# Patient Record
Sex: Female | Born: 1960 | Race: White | Hispanic: No | Marital: Single | Smoking: Current every day smoker
Health system: Southern US, Community
[De-identification: ages and names within clinical notes are randomized; demographics above are authoritative.]

## PROBLEM LIST (undated history)

## (undated) DIAGNOSIS — Z86010 Personal history of colonic polyps: Secondary | ICD-10-CM

## (undated) DIAGNOSIS — C4401 Basal cell carcinoma of skin of lip: Secondary | ICD-10-CM

## (undated) DIAGNOSIS — E785 Hyperlipidemia, unspecified: Secondary | ICD-10-CM

## (undated) DIAGNOSIS — Z8659 Personal history of other mental and behavioral disorders: Secondary | ICD-10-CM

## (undated) DIAGNOSIS — F209 Schizophrenia, unspecified: Secondary | ICD-10-CM

## (undated) DIAGNOSIS — J302 Other seasonal allergic rhinitis: Secondary | ICD-10-CM

## (undated) DIAGNOSIS — J439 Emphysema, unspecified: Secondary | ICD-10-CM

## (undated) DIAGNOSIS — Z9889 Other specified postprocedural states: Secondary | ICD-10-CM

## (undated) DIAGNOSIS — L309 Dermatitis, unspecified: Secondary | ICD-10-CM

## (undated) DIAGNOSIS — Z860101 Personal history of adenomatous and serrated colon polyps: Secondary | ICD-10-CM

## (undated) DIAGNOSIS — R7303 Prediabetes: Secondary | ICD-10-CM

## (undated) DIAGNOSIS — R000 Tachycardia, unspecified: Secondary | ICD-10-CM

## (undated) DIAGNOSIS — K621 Rectal polyp: Secondary | ICD-10-CM

## (undated) DIAGNOSIS — F411 Generalized anxiety disorder: Secondary | ICD-10-CM

## (undated) DIAGNOSIS — J341 Cyst and mucocele of nose and nasal sinus: Principal | ICD-10-CM

## (undated) DIAGNOSIS — Z85828 Personal history of other malignant neoplasm of skin: Secondary | ICD-10-CM

## (undated) DIAGNOSIS — K5909 Other constipation: Secondary | ICD-10-CM

## (undated) DIAGNOSIS — K219 Gastro-esophageal reflux disease without esophagitis: Secondary | ICD-10-CM

## (undated) DIAGNOSIS — F410 Panic disorder [episodic paroxysmal anxiety] without agoraphobia: Secondary | ICD-10-CM

## (undated) DIAGNOSIS — T7840XA Allergy, unspecified, initial encounter: Secondary | ICD-10-CM

## (undated) DIAGNOSIS — L409 Psoriasis, unspecified: Secondary | ICD-10-CM

## (undated) DIAGNOSIS — J45909 Unspecified asthma, uncomplicated: Secondary | ICD-10-CM

## (undated) DIAGNOSIS — F17200 Nicotine dependence, unspecified, uncomplicated: Secondary | ICD-10-CM

## (undated) DIAGNOSIS — F419 Anxiety disorder, unspecified: Secondary | ICD-10-CM

## (undated) HISTORY — DX: Panic disorder (episodic paroxysmal anxiety): F41.0

## (undated) HISTORY — DX: Cyst and mucocele of nose and nasal sinus: J34.1

## (undated) HISTORY — DX: Tachycardia, unspecified: R00.0

## (undated) HISTORY — DX: Hyperlipidemia, unspecified: E78.5

## (undated) HISTORY — DX: Gastro-esophageal reflux disease without esophagitis: K21.9

## (undated) HISTORY — DX: Emphysema, unspecified: J43.9

## (undated) HISTORY — DX: Basal cell carcinoma of skin of lip: C44.01

## (undated) HISTORY — PX: COLONOSCOPY: SHX174

## (undated) HISTORY — DX: Anxiety disorder, unspecified: F41.9

## (undated) HISTORY — PX: OTHER SURGICAL HISTORY: SHX169

## (undated) HISTORY — DX: Nicotine dependence, unspecified, uncomplicated: F17.200

## (undated) HISTORY — DX: Allergy, unspecified, initial encounter: T78.40XA

---

## 1997-11-09 ENCOUNTER — Encounter: Admission: RE | Admit: 1997-11-09 | Discharge: 1997-11-09 | Payer: Self-pay | Admitting: Family Medicine

## 1998-02-06 ENCOUNTER — Encounter: Admission: RE | Admit: 1998-02-06 | Discharge: 1998-02-06 | Payer: Self-pay | Admitting: Family Medicine

## 1998-04-06 ENCOUNTER — Encounter: Admission: RE | Admit: 1998-04-06 | Discharge: 1998-04-06 | Payer: Self-pay | Admitting: Family Medicine

## 1998-04-09 ENCOUNTER — Encounter: Admission: RE | Admit: 1998-04-09 | Discharge: 1998-04-09 | Payer: Self-pay | Admitting: Family Medicine

## 1998-05-07 ENCOUNTER — Encounter: Admission: RE | Admit: 1998-05-07 | Discharge: 1998-05-07 | Payer: Self-pay | Admitting: Family Medicine

## 1998-05-08 ENCOUNTER — Ambulatory Visit: Admission: RE | Admit: 1998-05-08 | Discharge: 1998-05-08 | Payer: Self-pay | Admitting: Family Medicine

## 1998-06-05 ENCOUNTER — Encounter: Admission: RE | Admit: 1998-06-05 | Discharge: 1998-06-05 | Payer: Self-pay | Admitting: Sports Medicine

## 1998-06-12 ENCOUNTER — Encounter: Admission: RE | Admit: 1998-06-12 | Discharge: 1998-06-12 | Payer: Self-pay | Admitting: Sports Medicine

## 1998-10-30 ENCOUNTER — Encounter: Admission: RE | Admit: 1998-10-30 | Discharge: 1998-10-30 | Payer: Self-pay | Admitting: Sports Medicine

## 1999-01-22 ENCOUNTER — Encounter: Admission: RE | Admit: 1999-01-22 | Discharge: 1999-01-22 | Payer: Self-pay | Admitting: Sports Medicine

## 1999-04-09 ENCOUNTER — Encounter: Admission: RE | Admit: 1999-04-09 | Discharge: 1999-04-09 | Payer: Self-pay | Admitting: Family Medicine

## 1999-04-12 ENCOUNTER — Encounter: Admission: RE | Admit: 1999-04-12 | Discharge: 1999-04-12 | Payer: Self-pay | Admitting: Family Medicine

## 1999-04-17 ENCOUNTER — Encounter: Admission: RE | Admit: 1999-04-17 | Discharge: 1999-04-17 | Payer: Self-pay | Admitting: Family Medicine

## 1999-04-23 ENCOUNTER — Encounter: Admission: RE | Admit: 1999-04-23 | Discharge: 1999-04-23 | Payer: Self-pay | Admitting: Sports Medicine

## 1999-05-21 ENCOUNTER — Encounter: Admission: RE | Admit: 1999-05-21 | Discharge: 1999-05-21 | Payer: Self-pay | Admitting: Sports Medicine

## 1999-07-23 ENCOUNTER — Encounter: Admission: RE | Admit: 1999-07-23 | Discharge: 1999-07-23 | Payer: Self-pay | Admitting: Family Medicine

## 1999-10-25 ENCOUNTER — Encounter: Admission: RE | Admit: 1999-10-25 | Discharge: 1999-10-25 | Payer: Self-pay | Admitting: Family Medicine

## 2000-03-26 ENCOUNTER — Encounter: Admission: RE | Admit: 2000-03-26 | Discharge: 2000-03-26 | Payer: Self-pay | Admitting: Family Medicine

## 2000-04-03 ENCOUNTER — Encounter: Admission: RE | Admit: 2000-04-03 | Discharge: 2000-04-03 | Payer: Self-pay | Admitting: Family Medicine

## 2000-04-20 ENCOUNTER — Encounter: Admission: RE | Admit: 2000-04-20 | Discharge: 2000-04-20 | Payer: Self-pay | Admitting: Family Medicine

## 2000-04-20 ENCOUNTER — Encounter: Payer: Self-pay | Admitting: Family Medicine

## 2000-04-27 ENCOUNTER — Encounter: Admission: RE | Admit: 2000-04-27 | Discharge: 2000-04-27 | Payer: Self-pay | Admitting: Family Medicine

## 2000-04-27 ENCOUNTER — Other Ambulatory Visit: Admission: RE | Admit: 2000-04-27 | Discharge: 2000-04-27 | Payer: Self-pay | Admitting: Family Medicine

## 2000-04-29 ENCOUNTER — Encounter: Admission: RE | Admit: 2000-04-29 | Discharge: 2000-04-29 | Payer: Self-pay | Admitting: Family Medicine

## 2000-07-31 ENCOUNTER — Encounter: Admission: RE | Admit: 2000-07-31 | Discharge: 2000-07-31 | Payer: Self-pay | Admitting: Family Medicine

## 2000-08-07 ENCOUNTER — Encounter: Admission: RE | Admit: 2000-08-07 | Discharge: 2000-08-07 | Payer: Self-pay | Admitting: Family Medicine

## 2000-12-08 ENCOUNTER — Encounter: Admission: RE | Admit: 2000-12-08 | Discharge: 2000-12-08 | Payer: Self-pay | Admitting: Family Medicine

## 2000-12-15 ENCOUNTER — Encounter: Admission: RE | Admit: 2000-12-15 | Discharge: 2000-12-15 | Payer: Self-pay | Admitting: Pediatrics

## 2001-03-16 ENCOUNTER — Encounter: Admission: RE | Admit: 2001-03-16 | Discharge: 2001-03-16 | Payer: Self-pay | Admitting: Family Medicine

## 2001-03-26 ENCOUNTER — Encounter: Admission: RE | Admit: 2001-03-26 | Discharge: 2001-03-26 | Payer: Self-pay | Admitting: Family Medicine

## 2001-04-06 ENCOUNTER — Encounter: Admission: RE | Admit: 2001-04-06 | Discharge: 2001-04-06 | Payer: Self-pay | Admitting: Family Medicine

## 2001-04-27 ENCOUNTER — Encounter: Payer: Self-pay | Admitting: Family Medicine

## 2001-04-27 ENCOUNTER — Encounter: Admission: RE | Admit: 2001-04-27 | Discharge: 2001-04-27 | Payer: Self-pay | Admitting: Family Medicine

## 2001-06-15 ENCOUNTER — Encounter: Admission: RE | Admit: 2001-06-15 | Discharge: 2001-06-15 | Payer: Self-pay | Admitting: Family Medicine

## 2001-06-17 ENCOUNTER — Encounter: Admission: RE | Admit: 2001-06-17 | Discharge: 2001-06-17 | Payer: Self-pay | Admitting: Family Medicine

## 2001-06-24 ENCOUNTER — Encounter: Admission: RE | Admit: 2001-06-24 | Discharge: 2001-06-24 | Payer: Self-pay | Admitting: Family Medicine

## 2001-08-30 ENCOUNTER — Encounter: Admission: RE | Admit: 2001-08-30 | Discharge: 2001-08-30 | Payer: Self-pay | Admitting: Family Medicine

## 2001-09-23 ENCOUNTER — Encounter: Admission: RE | Admit: 2001-09-23 | Discharge: 2001-09-23 | Payer: Self-pay | Admitting: Family Medicine

## 2001-12-22 ENCOUNTER — Encounter: Admission: RE | Admit: 2001-12-22 | Discharge: 2001-12-22 | Payer: Self-pay | Admitting: Family Medicine

## 2002-03-15 ENCOUNTER — Encounter: Admission: RE | Admit: 2002-03-15 | Discharge: 2002-03-15 | Payer: Self-pay | Admitting: Family Medicine

## 2002-04-12 ENCOUNTER — Encounter: Admission: RE | Admit: 2002-04-12 | Discharge: 2002-04-12 | Payer: Self-pay | Admitting: Sports Medicine

## 2002-05-24 ENCOUNTER — Encounter: Payer: Self-pay | Admitting: Sports Medicine

## 2002-05-24 ENCOUNTER — Encounter: Admission: RE | Admit: 2002-05-24 | Discharge: 2002-05-24 | Payer: Self-pay | Admitting: Sports Medicine

## 2002-05-31 ENCOUNTER — Encounter: Payer: Self-pay | Admitting: Sports Medicine

## 2002-05-31 ENCOUNTER — Encounter: Admission: RE | Admit: 2002-05-31 | Discharge: 2002-05-31 | Payer: Self-pay | Admitting: Sports Medicine

## 2002-09-07 ENCOUNTER — Encounter: Admission: RE | Admit: 2002-09-07 | Discharge: 2002-09-07 | Payer: Self-pay | Admitting: Family Medicine

## 2002-09-14 ENCOUNTER — Encounter: Admission: RE | Admit: 2002-09-14 | Discharge: 2002-09-14 | Payer: Self-pay | Admitting: Family Medicine

## 2002-12-07 ENCOUNTER — Encounter: Admission: RE | Admit: 2002-12-07 | Discharge: 2002-12-07 | Payer: Self-pay | Admitting: Family Medicine

## 2003-02-06 ENCOUNTER — Encounter: Admission: RE | Admit: 2003-02-06 | Discharge: 2003-02-06 | Payer: Self-pay | Admitting: Family Medicine

## 2003-02-23 ENCOUNTER — Encounter: Admission: RE | Admit: 2003-02-23 | Discharge: 2003-02-23 | Payer: Self-pay | Admitting: Family Medicine

## 2003-03-31 ENCOUNTER — Encounter: Admission: RE | Admit: 2003-03-31 | Discharge: 2003-03-31 | Payer: Self-pay | Admitting: Family Medicine

## 2003-05-19 ENCOUNTER — Encounter: Admission: RE | Admit: 2003-05-19 | Discharge: 2003-05-19 | Payer: Self-pay | Admitting: Family Medicine

## 2003-06-12 ENCOUNTER — Encounter: Admission: RE | Admit: 2003-06-12 | Discharge: 2003-06-12 | Payer: Self-pay | Admitting: Sports Medicine

## 2003-08-18 ENCOUNTER — Encounter: Admission: RE | Admit: 2003-08-18 | Discharge: 2003-08-18 | Payer: Self-pay | Admitting: Family Medicine

## 2003-08-29 ENCOUNTER — Encounter: Admission: RE | Admit: 2003-08-29 | Discharge: 2003-08-29 | Payer: Self-pay | Admitting: Sports Medicine

## 2003-11-20 ENCOUNTER — Encounter: Admission: RE | Admit: 2003-11-20 | Discharge: 2003-11-20 | Payer: Self-pay | Admitting: Family Medicine

## 2004-02-08 ENCOUNTER — Encounter: Admission: RE | Admit: 2004-02-08 | Discharge: 2004-02-08 | Payer: Self-pay | Admitting: Family Medicine

## 2004-05-07 ENCOUNTER — Ambulatory Visit: Payer: Self-pay | Admitting: Sports Medicine

## 2004-05-21 ENCOUNTER — Ambulatory Visit: Payer: Self-pay | Admitting: Family Medicine

## 2004-07-04 ENCOUNTER — Encounter: Admission: RE | Admit: 2004-07-04 | Discharge: 2004-07-04 | Payer: Self-pay | Admitting: Sports Medicine

## 2004-08-01 ENCOUNTER — Ambulatory Visit: Payer: Self-pay | Admitting: Sports Medicine

## 2004-08-07 ENCOUNTER — Encounter (INDEPENDENT_AMBULATORY_CARE_PROVIDER_SITE_OTHER): Payer: Self-pay | Admitting: *Deleted

## 2004-08-07 LAB — CONVERTED CEMR LAB

## 2004-08-16 ENCOUNTER — Ambulatory Visit: Payer: Self-pay | Admitting: Sports Medicine

## 2004-10-17 ENCOUNTER — Ambulatory Visit: Payer: Self-pay | Admitting: Sports Medicine

## 2004-10-25 ENCOUNTER — Ambulatory Visit: Payer: Self-pay | Admitting: Family Medicine

## 2004-11-25 ENCOUNTER — Ambulatory Visit: Payer: Self-pay | Admitting: Family Medicine

## 2004-12-19 ENCOUNTER — Encounter (HOSPITAL_COMMUNITY): Admission: RE | Admit: 2004-12-19 | Discharge: 2005-03-19 | Payer: Self-pay | Admitting: Family Medicine

## 2004-12-19 ENCOUNTER — Ambulatory Visit: Payer: Self-pay | Admitting: Family Medicine

## 2004-12-20 ENCOUNTER — Ambulatory Visit: Payer: Self-pay | Admitting: Family Medicine

## 2005-01-06 ENCOUNTER — Ambulatory Visit: Payer: Self-pay | Admitting: Family Medicine

## 2005-03-26 ENCOUNTER — Ambulatory Visit: Payer: Self-pay | Admitting: Family Medicine

## 2005-05-09 ENCOUNTER — Ambulatory Visit: Payer: Self-pay | Admitting: Family Medicine

## 2005-06-03 ENCOUNTER — Ambulatory Visit: Payer: Self-pay | Admitting: Sports Medicine

## 2005-06-05 ENCOUNTER — Ambulatory Visit: Payer: Self-pay | Admitting: Sports Medicine

## 2005-06-19 ENCOUNTER — Ambulatory Visit: Payer: Self-pay | Admitting: Family Medicine

## 2005-07-23 ENCOUNTER — Encounter: Admission: RE | Admit: 2005-07-23 | Discharge: 2005-07-23 | Payer: Self-pay | Admitting: Sports Medicine

## 2005-07-23 IMAGING — MG MM MAMMO SCREENING
4 series · 4 of 4 positions shown · non-contrast
Comparison: none

SCREENING MAMMOGRAM:
There is a fibroglandular pattern.  No masses or malignant type calcifications are identified.  
Compared with prior studies.

[R CC]
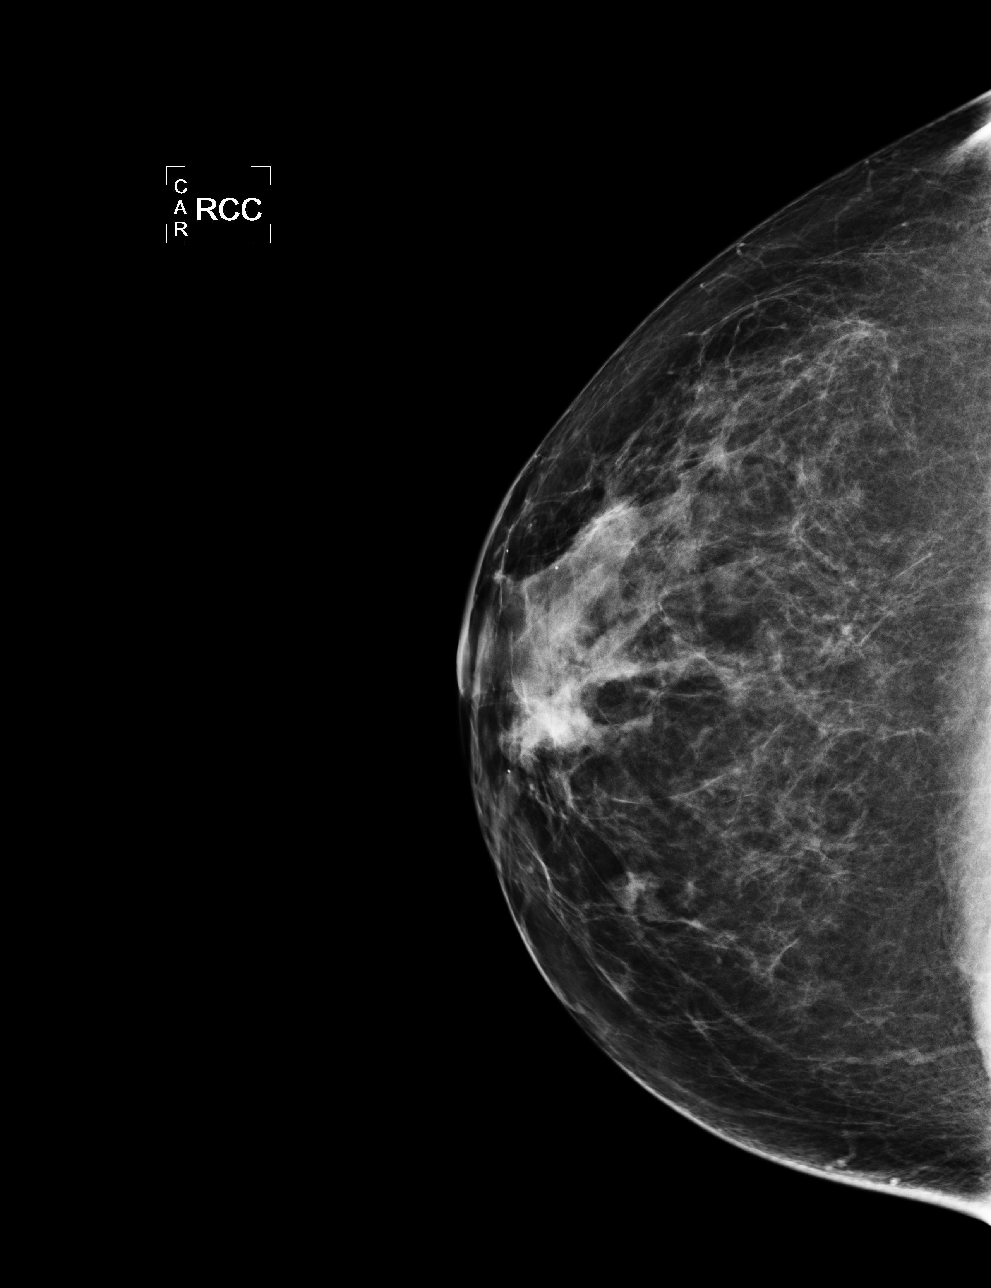

[R MLO]
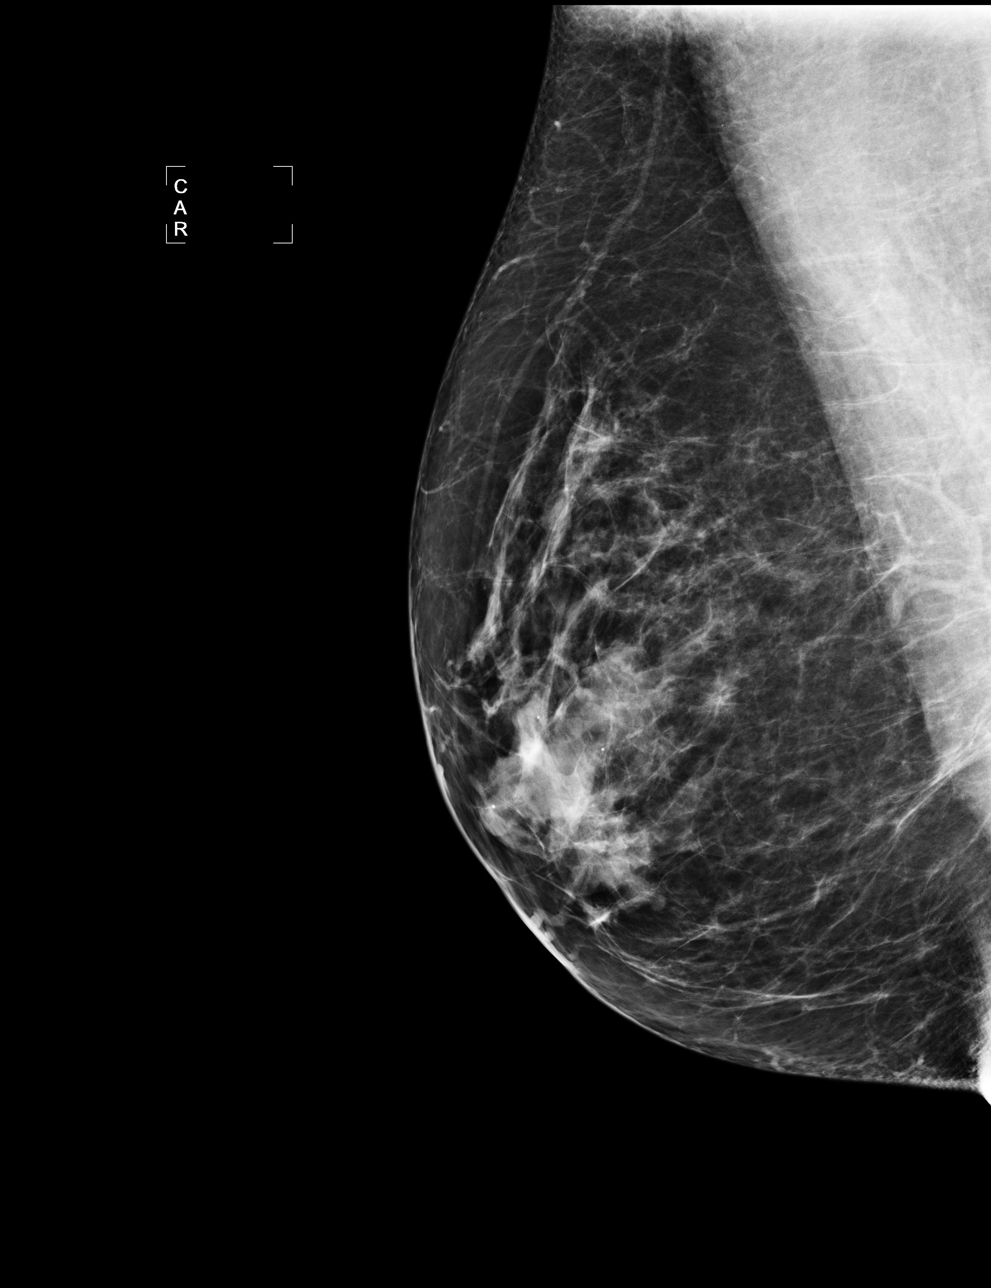

[L CC]
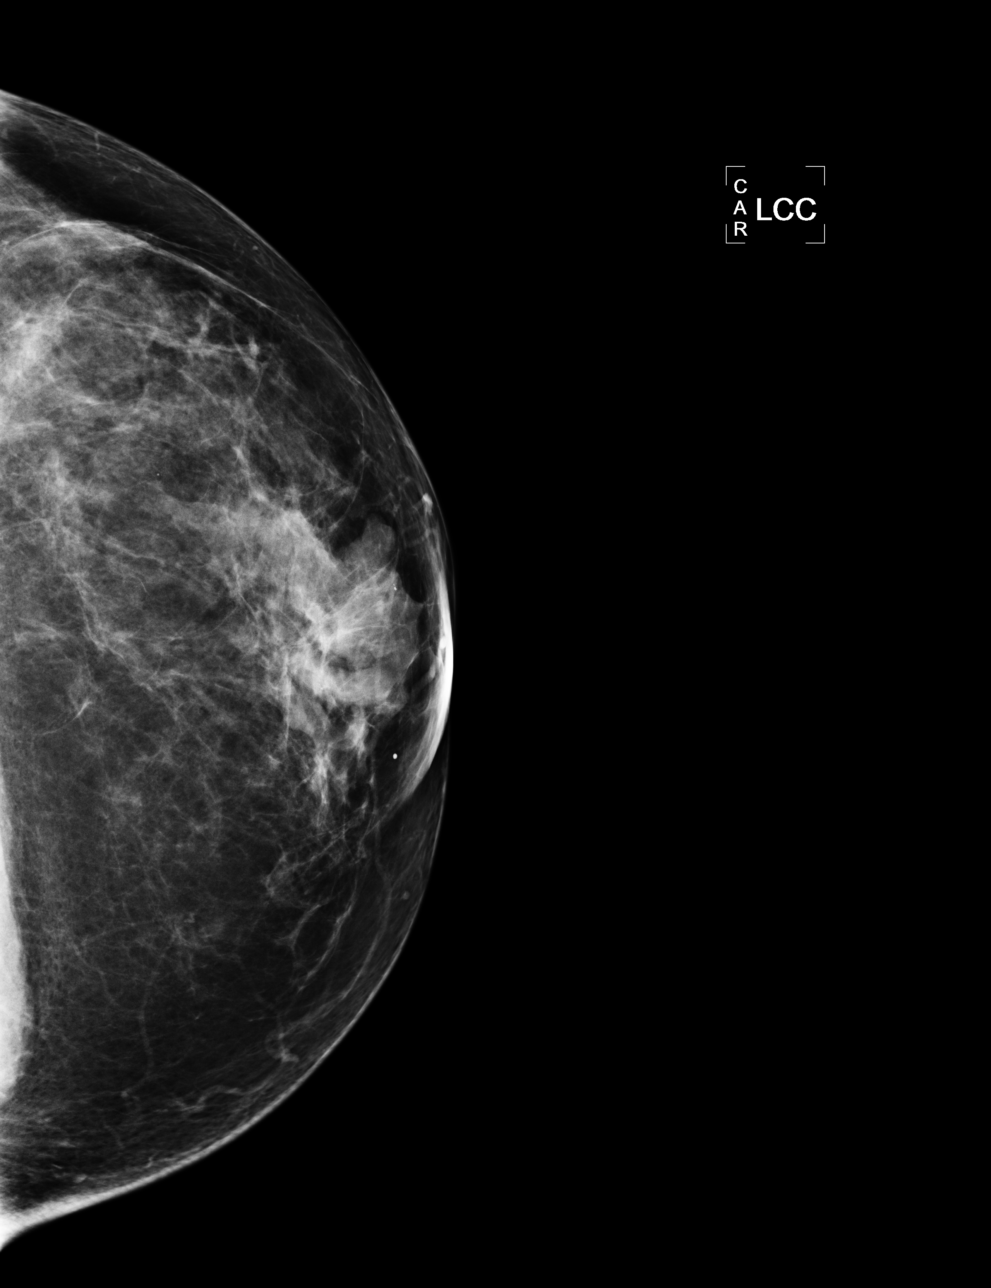

[L MLO]
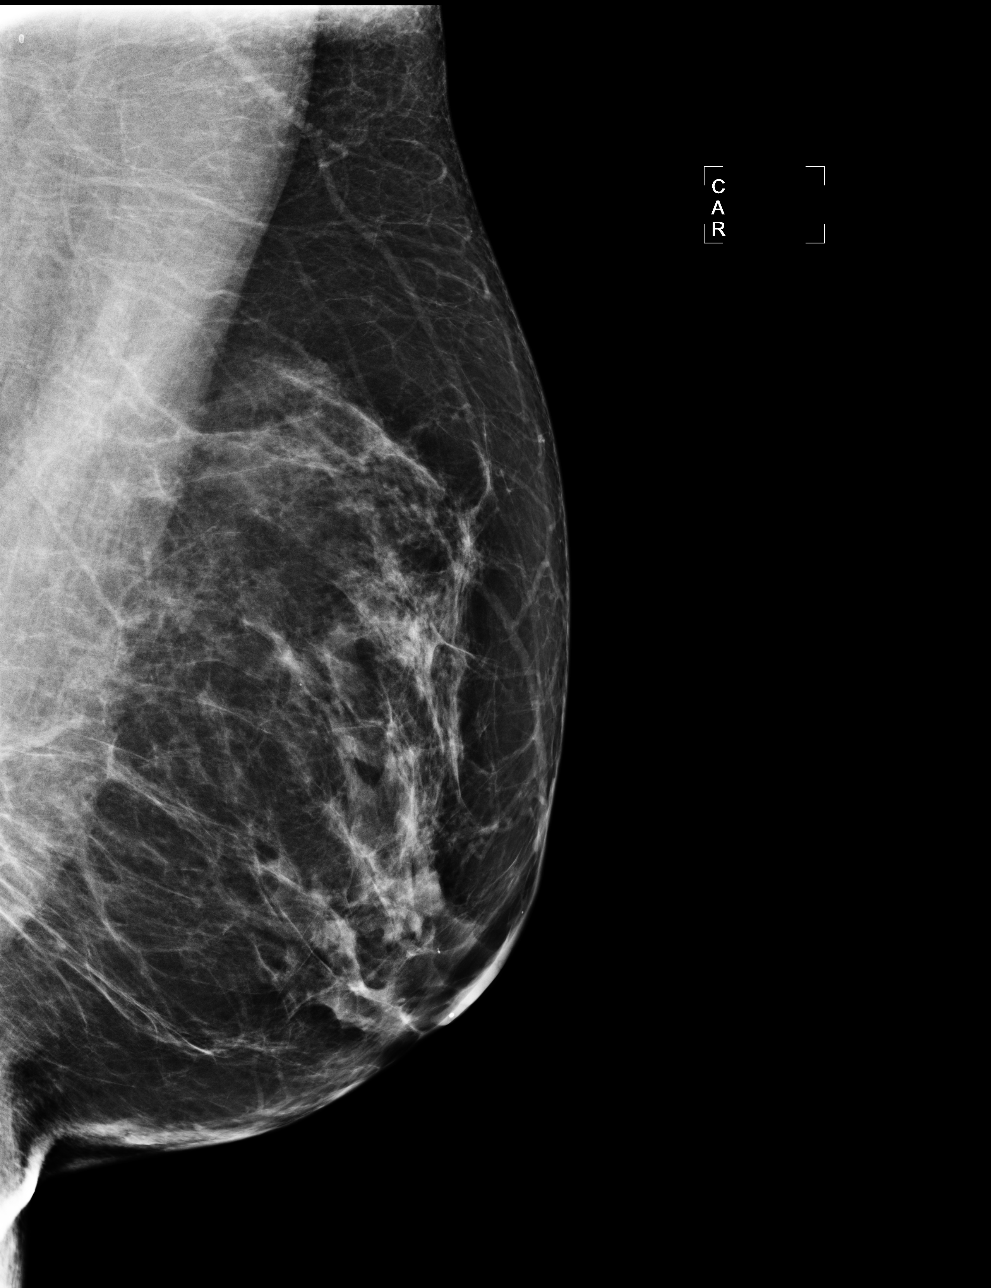

[4 of 4 positions shown; findings below may reference images not displayed]

IMPRESSION: No specific mammographic evidence of malignancy.  Next screening mammogram is recommended in one 
year.

ASSESSMENT: Negative - BI-RADS 1

Screening mammogram in 1 year.

## 2005-08-20 ENCOUNTER — Ambulatory Visit: Payer: Self-pay | Admitting: Family Medicine

## 2005-09-09 ENCOUNTER — Ambulatory Visit (HOSPITAL_COMMUNITY): Admission: RE | Admit: 2005-09-09 | Discharge: 2005-09-09 | Payer: Self-pay | Admitting: Family Medicine

## 2005-09-09 ENCOUNTER — Ambulatory Visit: Payer: Self-pay | Admitting: Family Medicine

## 2005-11-28 ENCOUNTER — Ambulatory Visit: Payer: Self-pay | Admitting: Family Medicine

## 2006-01-12 ENCOUNTER — Ambulatory Visit: Payer: Self-pay | Admitting: Family Medicine

## 2006-02-20 ENCOUNTER — Ambulatory Visit: Payer: Self-pay | Admitting: Family Medicine

## 2006-04-16 ENCOUNTER — Ambulatory Visit: Payer: Self-pay | Admitting: Family Medicine

## 2006-05-11 ENCOUNTER — Ambulatory Visit: Payer: Self-pay | Admitting: Sports Medicine

## 2006-05-25 ENCOUNTER — Ambulatory Visit: Payer: Self-pay | Admitting: Sports Medicine

## 2006-05-27 ENCOUNTER — Ambulatory Visit: Payer: Self-pay | Admitting: Family Medicine

## 2006-07-30 ENCOUNTER — Ambulatory Visit: Payer: Self-pay | Admitting: Family Medicine

## 2006-09-02 ENCOUNTER — Ambulatory Visit: Payer: Self-pay | Admitting: Family Medicine

## 2006-09-03 DIAGNOSIS — K3189 Other diseases of stomach and duodenum: Secondary | ICD-10-CM

## 2006-09-03 DIAGNOSIS — F410 Panic disorder [episodic paroxysmal anxiety] without agoraphobia: Secondary | ICD-10-CM

## 2006-09-03 DIAGNOSIS — K219 Gastro-esophageal reflux disease without esophagitis: Secondary | ICD-10-CM | POA: Insufficient documentation

## 2006-09-03 DIAGNOSIS — F209 Schizophrenia, unspecified: Secondary | ICD-10-CM | POA: Insufficient documentation

## 2006-09-03 DIAGNOSIS — L719 Rosacea, unspecified: Secondary | ICD-10-CM

## 2006-09-03 DIAGNOSIS — F17200 Nicotine dependence, unspecified, uncomplicated: Secondary | ICD-10-CM

## 2006-09-03 DIAGNOSIS — R1013 Epigastric pain: Secondary | ICD-10-CM

## 2006-09-04 ENCOUNTER — Encounter (INDEPENDENT_AMBULATORY_CARE_PROVIDER_SITE_OTHER): Payer: Self-pay | Admitting: *Deleted

## 2006-09-23 ENCOUNTER — Telehealth: Payer: Self-pay | Admitting: *Deleted

## 2006-09-23 ENCOUNTER — Encounter (INDEPENDENT_AMBULATORY_CARE_PROVIDER_SITE_OTHER): Payer: Self-pay | Admitting: Family Medicine

## 2006-10-16 ENCOUNTER — Telehealth (INDEPENDENT_AMBULATORY_CARE_PROVIDER_SITE_OTHER): Payer: Self-pay | Admitting: *Deleted

## 2006-10-22 ENCOUNTER — Ambulatory Visit: Payer: Self-pay | Admitting: Family Medicine

## 2006-11-03 ENCOUNTER — Encounter: Admission: RE | Admit: 2006-11-03 | Discharge: 2006-11-03 | Payer: Self-pay

## 2006-11-03 IMAGING — MG MM SCREEN MAMMOGRAM BILATERAL
1 series · 1 of 1 positions shown · non-contrast
Comparison: none

DG SCREEN MAMMOGRAM BILATERAL
Bilateral CC and MLO view(s) were taken.
Technologist: Jhemboy Padam
Prior study comparison: July 04, 2004, bilateral screening mammogram.

DIGITAL SCREENING MAMMOGRAM WITH CAD:
There are scattered fibroglandular densities.  There is no dominant mass, architectural distortion 
or calcification to suggest malignancy.

[L MLO]
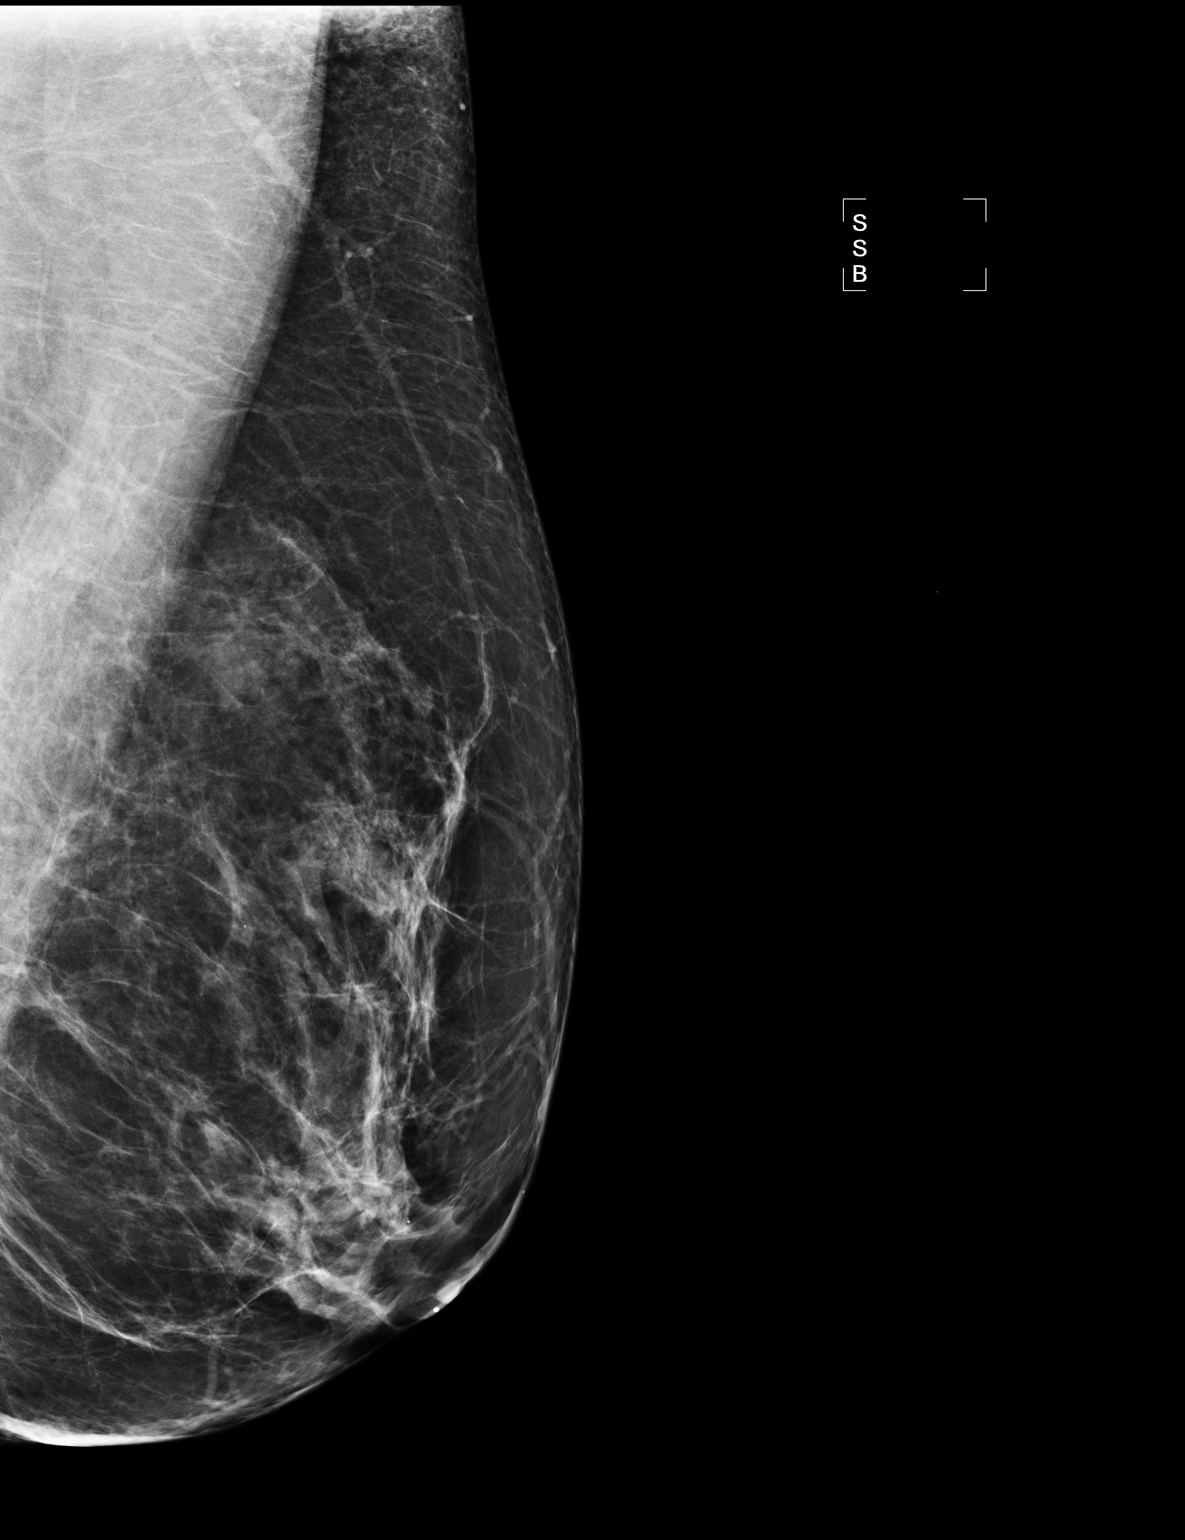

[1 of 1 positions shown; findings below may reference images not displayed]

IMPRESSION: No mammographic evidence of malignancy.  Suggest yearly screening mammography.

ASSESSMENT: Negative - BI-RADS 1

Screening mammogram in 1 year.
ANALYZED BY COMPUTER AIDED DETECTION. , THIS PROCEDURE WAS A DIGITAL MAMMOGRAM.

## 2006-11-03 IMAGING — MG MM SCREEN MAMMOGRAM BILATERAL
5 series · 5 of 5 positions shown · non-contrast
Comparison: none

DG SCREEN MAMMOGRAM BILATERAL
Bilateral CC and MLO view(s) were taken.
Technologist: Jhemboy Padam
Prior study comparison: July 04, 2004, bilateral screening mammogram.

DIGITAL SCREENING MAMMOGRAM WITH CAD:
There are scattered fibroglandular densities.  There is no dominant mass, architectural distortion 
or calcification to suggest malignancy.

[R CC (1 of 2)]
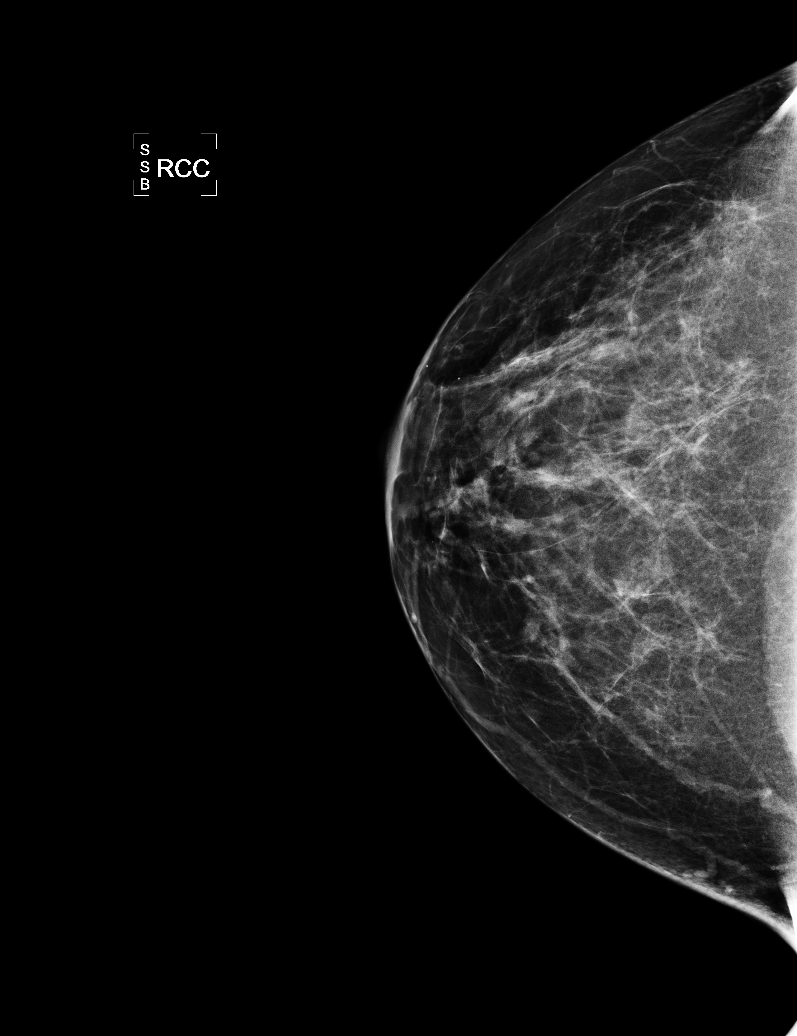

[L CC]
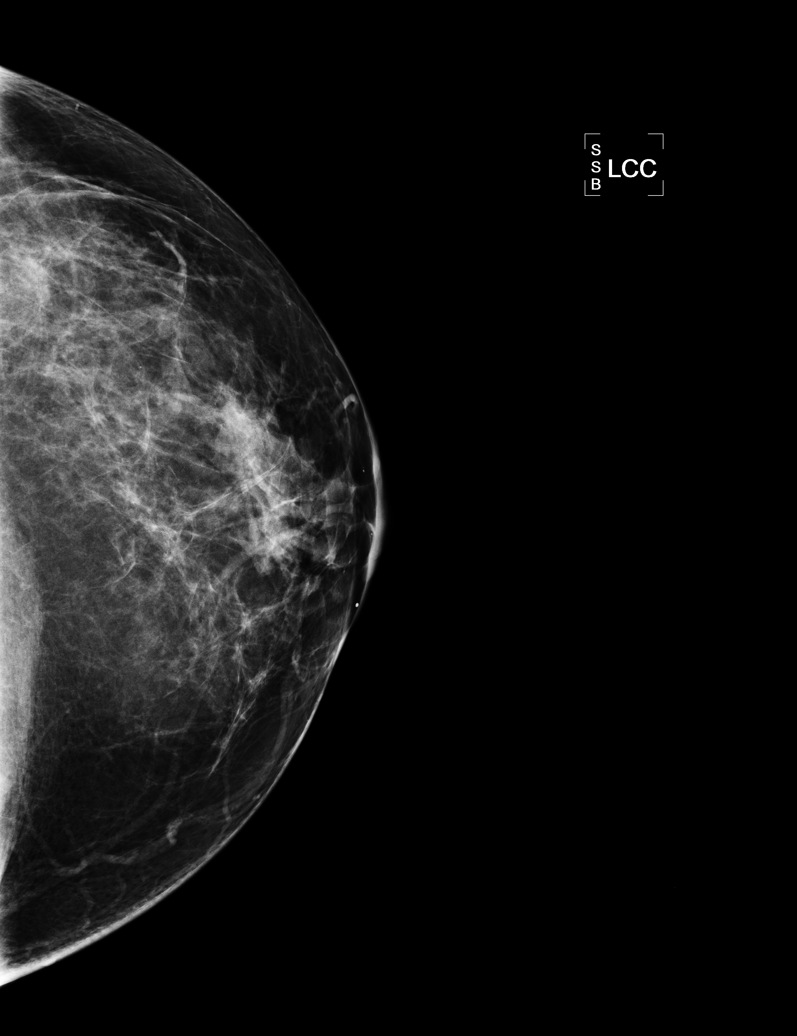

[L MLO]
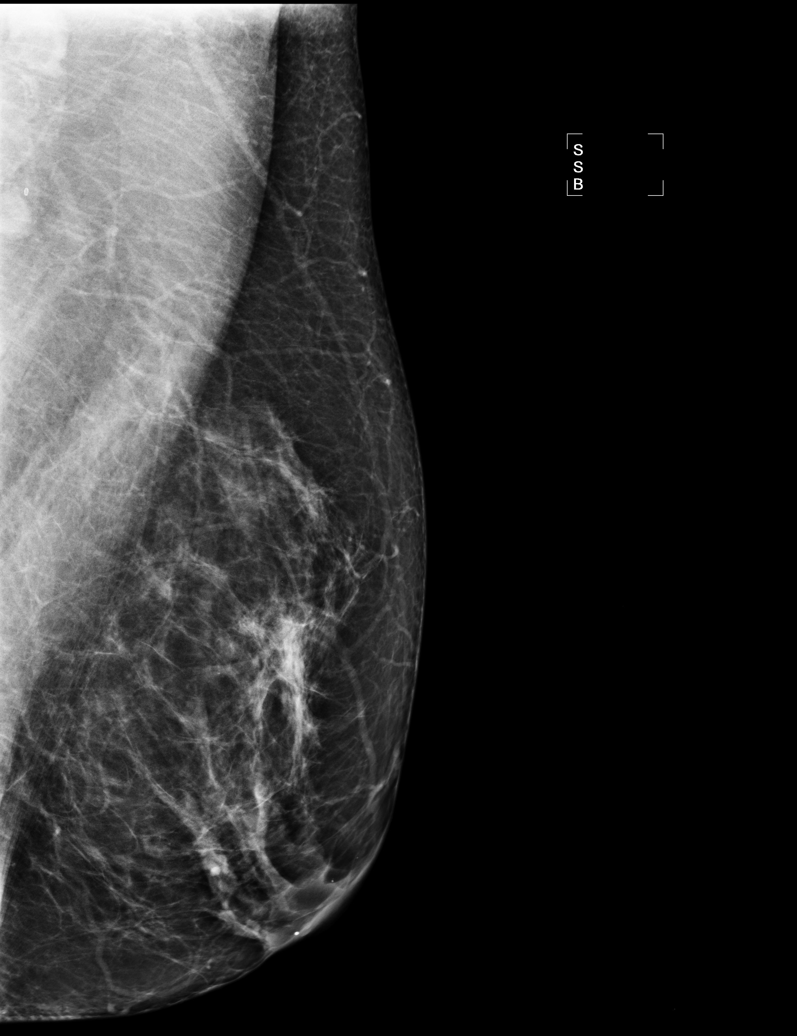

[R MLO]
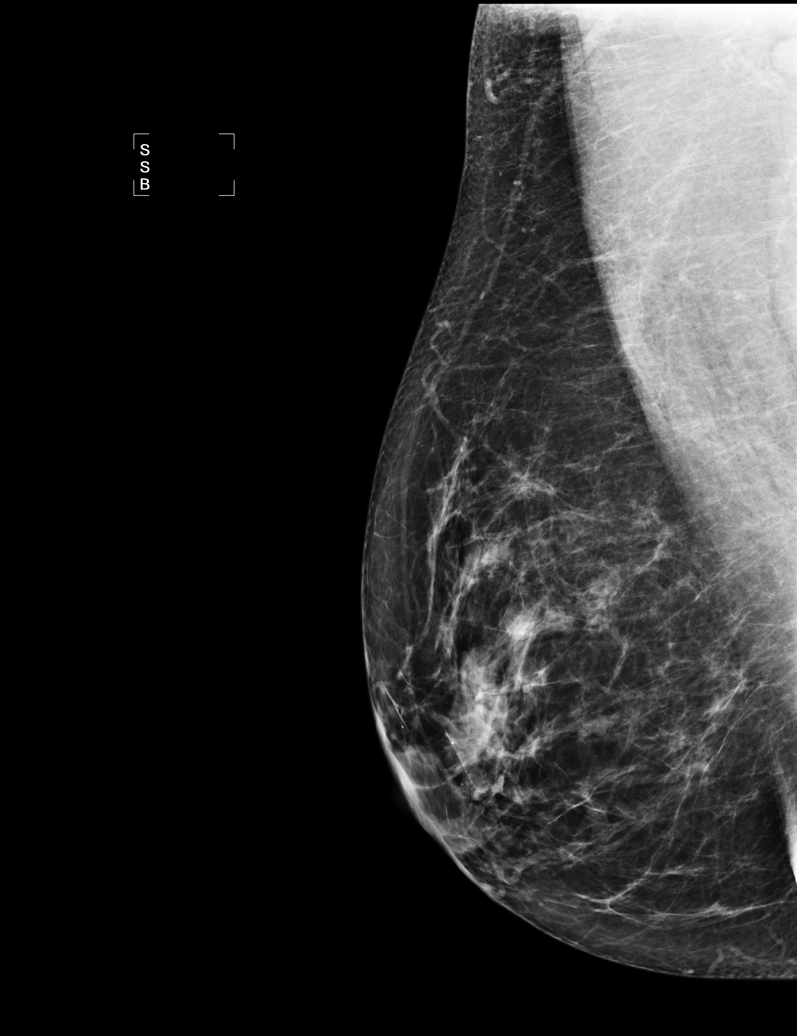

[R CC (2 of 2)]
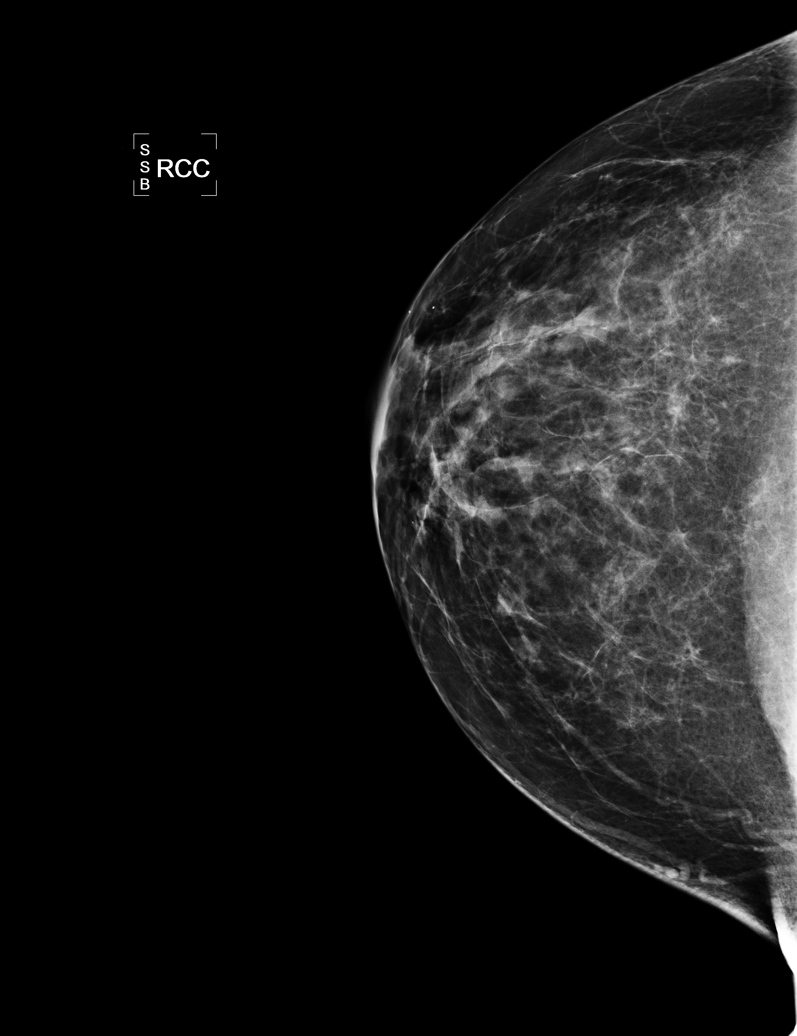

[5 of 5 positions shown; findings below may reference images not displayed]

IMPRESSION: No mammographic evidence of malignancy.  Suggest yearly screening mammography.

ASSESSMENT: Negative - BI-RADS 1

Screening mammogram in 1 year.
ANALYZED BY COMPUTER AIDED DETECTION. , THIS PROCEDURE WAS A DIGITAL MAMMOGRAM.

## 2006-11-09 ENCOUNTER — Telehealth (INDEPENDENT_AMBULATORY_CARE_PROVIDER_SITE_OTHER): Payer: Self-pay | Admitting: *Deleted

## 2007-01-06 ENCOUNTER — Ambulatory Visit: Payer: Self-pay | Admitting: Family Medicine

## 2007-01-25 ENCOUNTER — Encounter (INDEPENDENT_AMBULATORY_CARE_PROVIDER_SITE_OTHER): Payer: Self-pay | Admitting: Family Medicine

## 2007-01-25 ENCOUNTER — Ambulatory Visit: Payer: Self-pay | Admitting: Family Medicine

## 2007-01-25 LAB — CONVERTED CEMR LAB
Eosinophils Absolute: 0 10*3/uL (ref 0.0–0.7)
Eosinophils Relative: 0 % (ref 0–5)
Lymphocytes Relative: 41 % (ref 12–46)
Lymphs Abs: 3.3 10*3/uL (ref 0.7–3.3)
MCV: 93.3 fL (ref 78.0–100.0)
Monocytes Absolute: 0.6 10*3/uL (ref 0.2–0.7)
Neutro Abs: 4.2 10*3/uL (ref 1.7–7.7)
Neutrophils Relative %: 52 % (ref 43–77)
Platelets: 335 10*3/uL (ref 150–400)
RDW: 13.5 % (ref 11.5–14.0)

## 2007-01-26 ENCOUNTER — Telehealth (INDEPENDENT_AMBULATORY_CARE_PROVIDER_SITE_OTHER): Payer: Self-pay | Admitting: *Deleted

## 2007-01-27 ENCOUNTER — Encounter (INDEPENDENT_AMBULATORY_CARE_PROVIDER_SITE_OTHER): Payer: Self-pay | Admitting: Family Medicine

## 2007-01-27 ENCOUNTER — Telehealth (INDEPENDENT_AMBULATORY_CARE_PROVIDER_SITE_OTHER): Payer: Self-pay | Admitting: *Deleted

## 2007-04-05 ENCOUNTER — Ambulatory Visit: Payer: Self-pay | Admitting: Family Medicine

## 2007-04-06 ENCOUNTER — Telehealth: Payer: Self-pay | Admitting: *Deleted

## 2007-04-20 ENCOUNTER — Ambulatory Visit: Payer: Self-pay | Admitting: Family Medicine

## 2007-05-04 ENCOUNTER — Ambulatory Visit: Payer: Self-pay | Admitting: Family Medicine

## 2007-05-04 ENCOUNTER — Encounter (INDEPENDENT_AMBULATORY_CARE_PROVIDER_SITE_OTHER): Payer: Self-pay | Admitting: Family Medicine

## 2007-05-12 ENCOUNTER — Telehealth: Payer: Self-pay | Admitting: *Deleted

## 2007-05-12 ENCOUNTER — Encounter (INDEPENDENT_AMBULATORY_CARE_PROVIDER_SITE_OTHER): Payer: Self-pay | Admitting: Family Medicine

## 2007-05-12 ENCOUNTER — Ambulatory Visit: Payer: Self-pay | Admitting: Family Medicine

## 2007-05-12 ENCOUNTER — Telehealth (INDEPENDENT_AMBULATORY_CARE_PROVIDER_SITE_OTHER): Payer: Self-pay | Admitting: *Deleted

## 2007-05-12 LAB — CONVERTED CEMR LAB
Basophils Absolute: 0 10*3/uL (ref 0.0–0.1)
Basophils Relative: 0 % (ref 0–1)
Eosinophils Absolute: 0 10*3/uL (ref 0.0–0.7)
Eosinophils Relative: 0 % (ref 0–5)
Lymphocytes Relative: 45 % (ref 12–46)
Monocytes Absolute: 0.8 10*3/uL — ABNORMAL HIGH (ref 0.2–0.7)
Monocytes Relative: 9 % (ref 3–11)
Neutrophils Relative %: 46 % (ref 43–77)
RDW: 13.1 % (ref 11.5–14.0)

## 2007-05-13 ENCOUNTER — Telehealth: Payer: Self-pay | Admitting: *Deleted

## 2007-05-13 ENCOUNTER — Encounter (INDEPENDENT_AMBULATORY_CARE_PROVIDER_SITE_OTHER): Payer: Self-pay | Admitting: Family Medicine

## 2007-05-14 ENCOUNTER — Telehealth: Payer: Self-pay | Admitting: *Deleted

## 2007-06-21 ENCOUNTER — Ambulatory Visit: Payer: Self-pay | Admitting: Sports Medicine

## 2007-06-22 ENCOUNTER — Encounter: Payer: Self-pay | Admitting: *Deleted

## 2007-06-22 ENCOUNTER — Telehealth: Payer: Self-pay | Admitting: *Deleted

## 2007-08-16 ENCOUNTER — Telehealth: Payer: Self-pay | Admitting: *Deleted

## 2007-08-20 ENCOUNTER — Telehealth: Payer: Self-pay | Admitting: *Deleted

## 2007-08-23 ENCOUNTER — Ambulatory Visit: Payer: Self-pay | Admitting: Sports Medicine

## 2007-08-23 ENCOUNTER — Telehealth (INDEPENDENT_AMBULATORY_CARE_PROVIDER_SITE_OTHER): Payer: Self-pay | Admitting: Family Medicine

## 2007-08-27 ENCOUNTER — Ambulatory Visit: Payer: Self-pay | Admitting: Family Medicine

## 2007-08-31 ENCOUNTER — Telehealth (INDEPENDENT_AMBULATORY_CARE_PROVIDER_SITE_OTHER): Payer: Self-pay | Admitting: *Deleted

## 2007-10-08 ENCOUNTER — Ambulatory Visit: Payer: Self-pay | Admitting: Family Medicine

## 2007-10-08 LAB — CONVERTED CEMR LAB: Beta hcg, urine, semiquantitative: NEGATIVE

## 2007-10-22 ENCOUNTER — Telehealth: Payer: Self-pay | Admitting: *Deleted

## 2007-10-25 ENCOUNTER — Encounter (INDEPENDENT_AMBULATORY_CARE_PROVIDER_SITE_OTHER): Payer: Self-pay | Admitting: Family Medicine

## 2007-11-04 ENCOUNTER — Ambulatory Visit (HOSPITAL_COMMUNITY): Admission: RE | Admit: 2007-11-04 | Discharge: 2007-11-04 | Payer: Self-pay | Admitting: Family Medicine

## 2007-11-04 IMAGING — MG MS DIGITAL SCREENING BILAT
4 series · 4 of 4 positions shown · non-contrast
Comparison: none

DG SCHOLARSHIP MAMMOGRAM
Bilateral CC and MLO view(s) were taken.

DIGITAL SCREENING MAMMOGRAM WITH CAD:
There are scattered fibroglandular densities.  No masses or malignant type calcifications are 
identified.  Compared with prior studies.

[R CC]
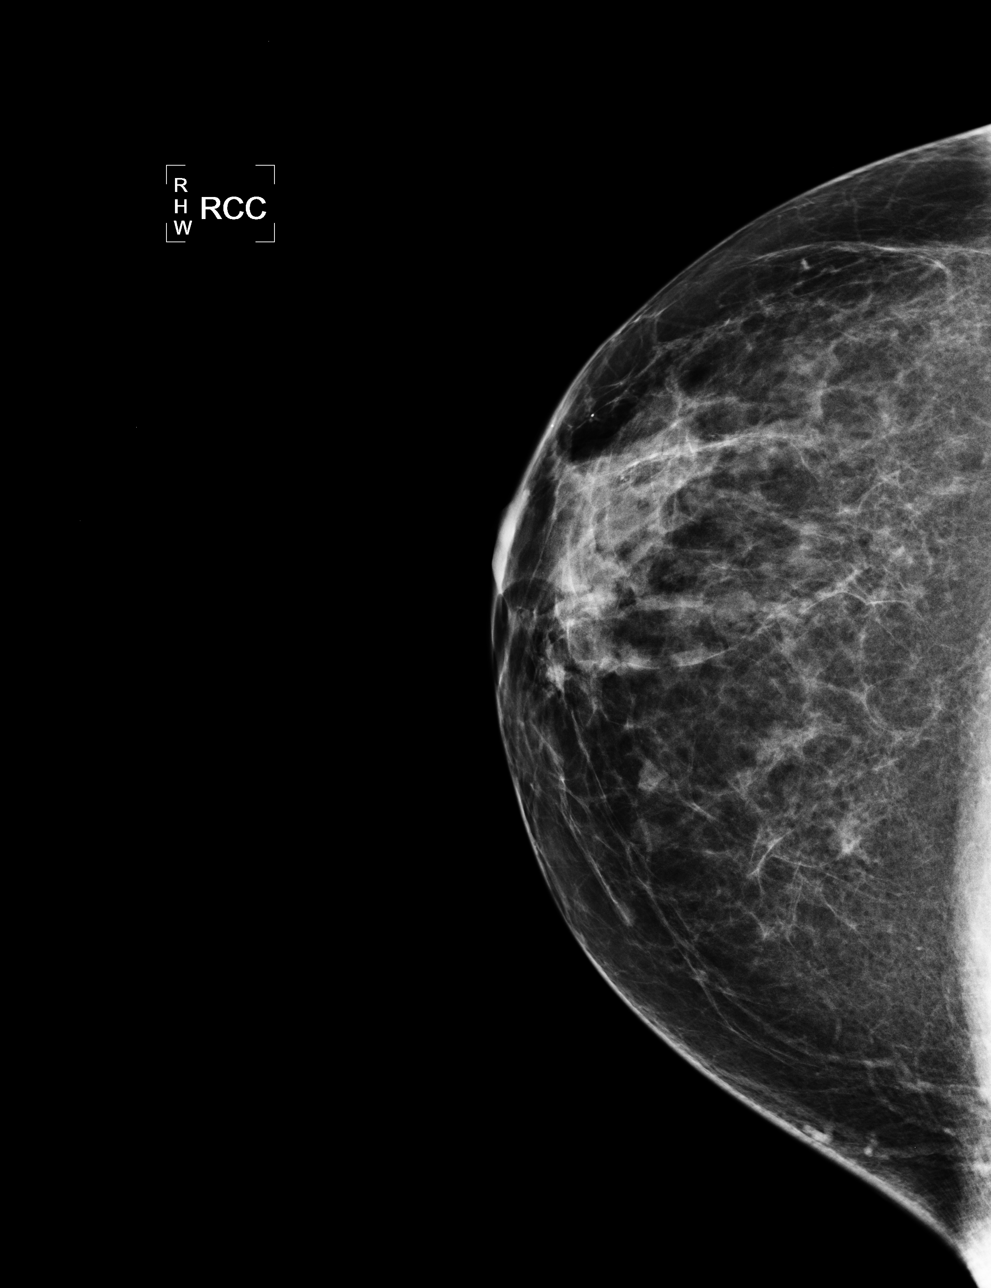

[R MLO]
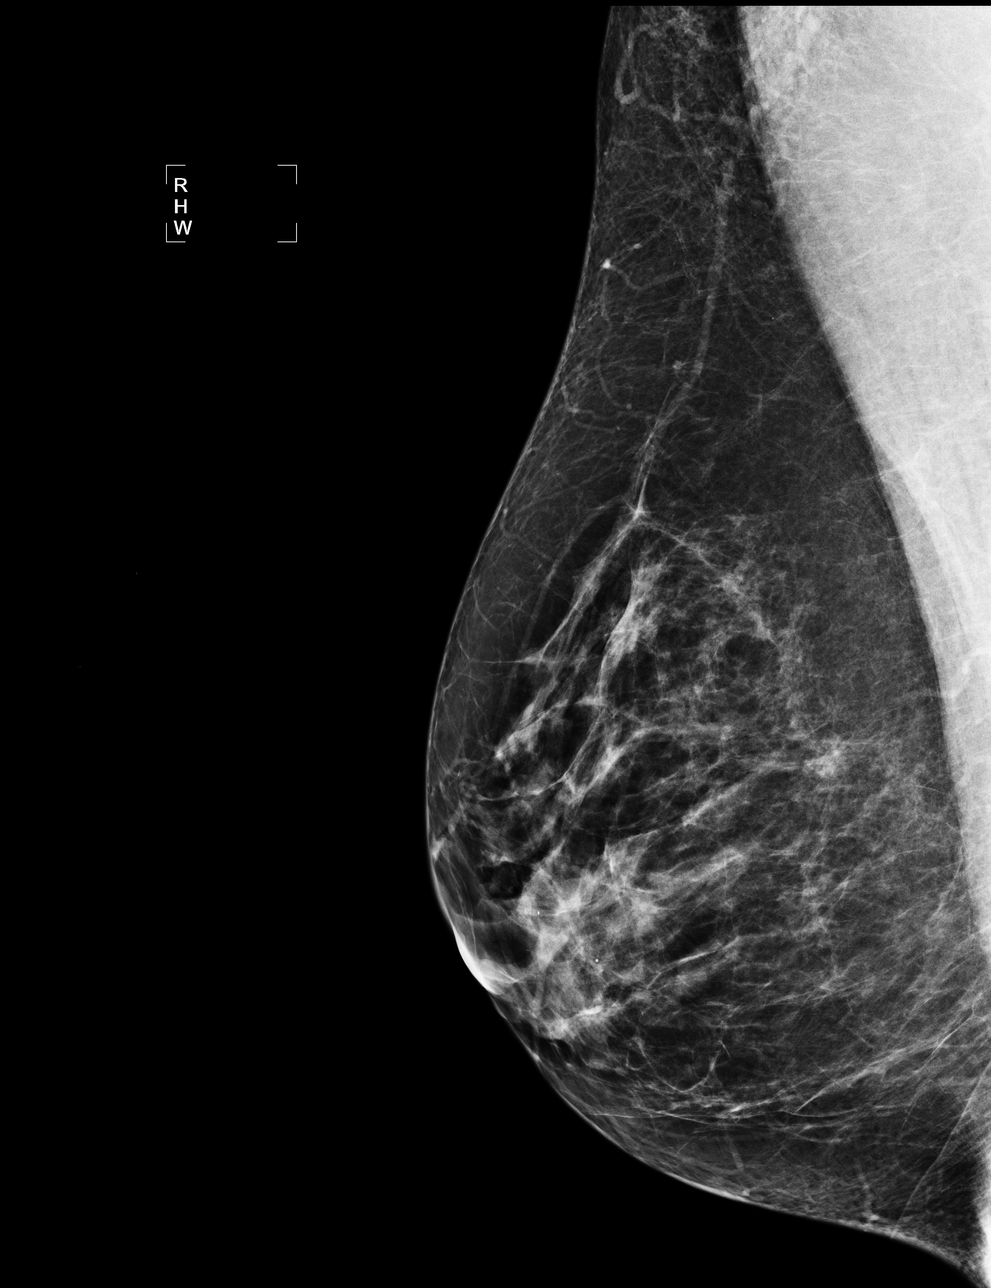

[L CC]
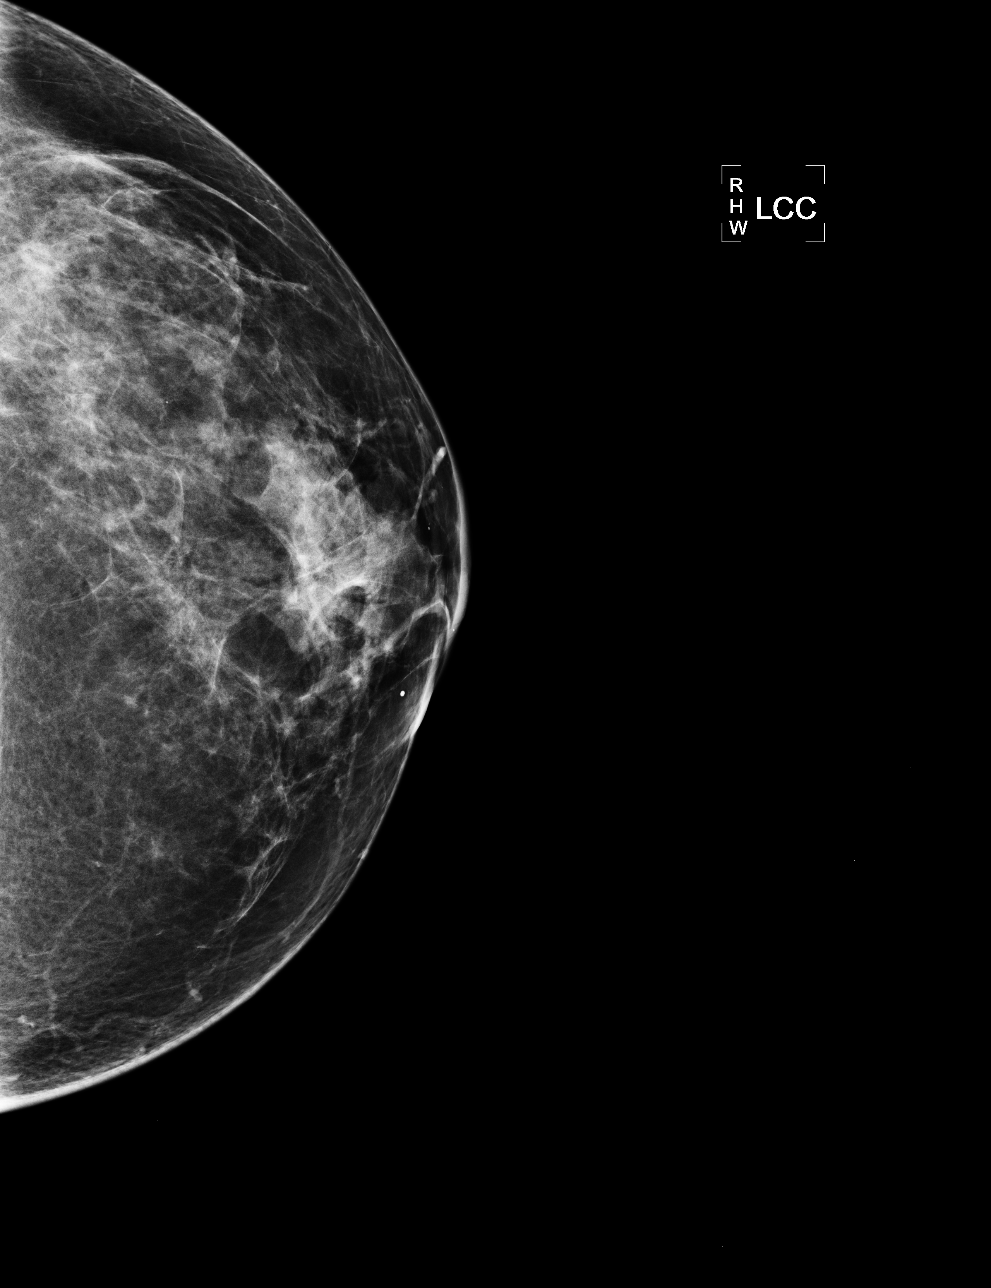

[L MLO]
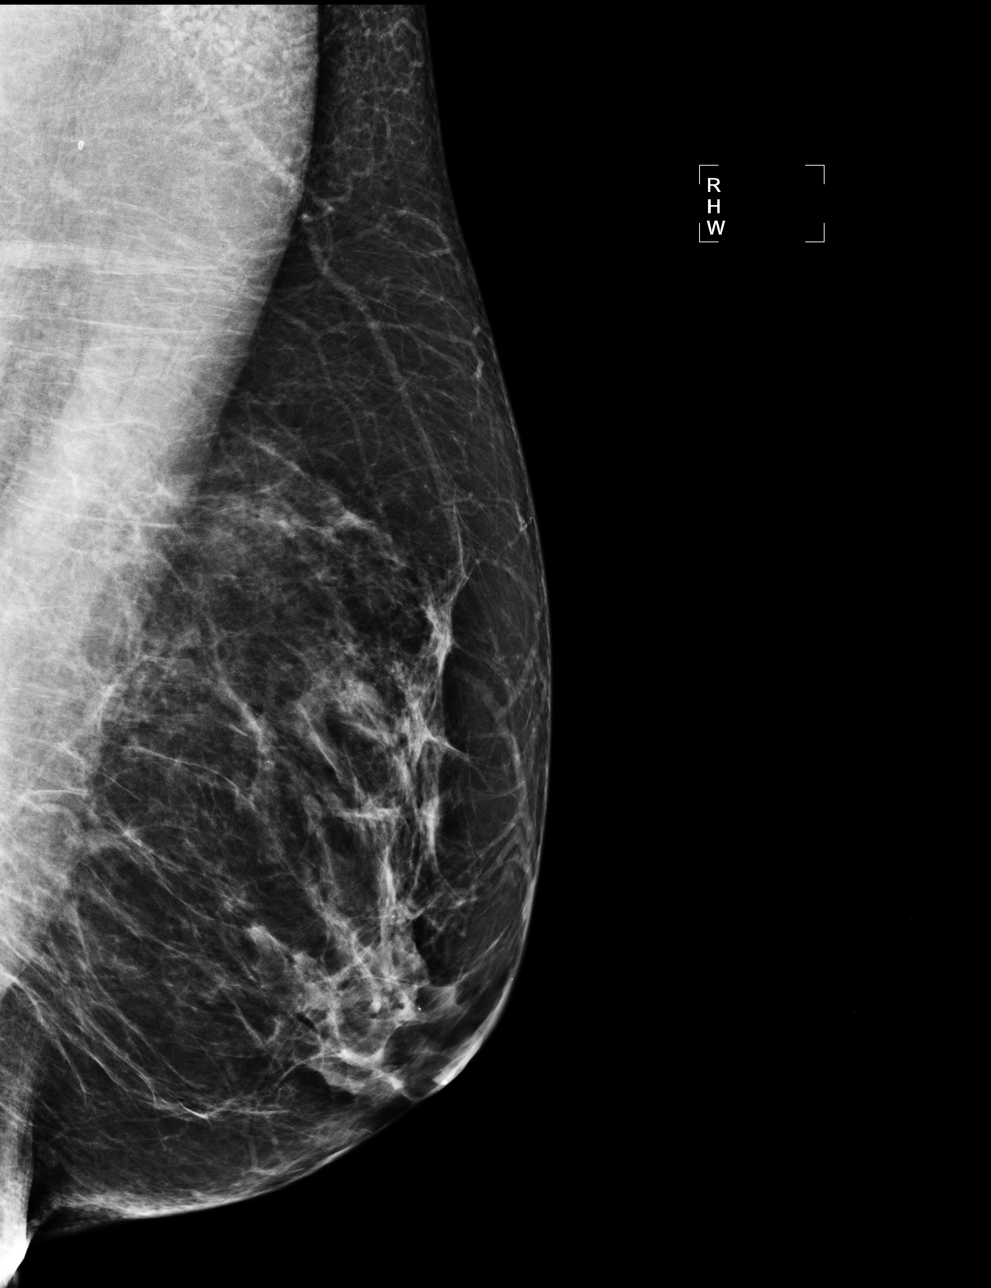

[4 of 4 positions shown; findings below may reference images not displayed]

IMPRESSION: No specific mammographic evidence of malignancy.  Next screening mammogram is recommended in one 
year.

ASSESSMENT: Negative - BI-RADS 1

Screening mammogram in 1 year.
ANALYZED BY COMPUTER AIDED DETECTION. , THIS PROCEDURE WAS A DIGITAL MAMMOGRAM.

## 2007-12-27 ENCOUNTER — Telehealth (INDEPENDENT_AMBULATORY_CARE_PROVIDER_SITE_OTHER): Payer: Self-pay | Admitting: *Deleted

## 2008-01-19 ENCOUNTER — Encounter: Payer: Self-pay | Admitting: Family Medicine

## 2008-01-24 ENCOUNTER — Telehealth (INDEPENDENT_AMBULATORY_CARE_PROVIDER_SITE_OTHER): Payer: Self-pay | Admitting: *Deleted

## 2008-01-24 ENCOUNTER — Encounter: Payer: Self-pay | Admitting: Family Medicine

## 2008-02-03 ENCOUNTER — Ambulatory Visit: Payer: Self-pay | Admitting: Family Medicine

## 2008-02-03 DIAGNOSIS — J45909 Unspecified asthma, uncomplicated: Secondary | ICD-10-CM | POA: Insufficient documentation

## 2008-03-28 ENCOUNTER — Ambulatory Visit: Payer: Self-pay | Admitting: Family Medicine

## 2008-03-29 ENCOUNTER — Telehealth (INDEPENDENT_AMBULATORY_CARE_PROVIDER_SITE_OTHER): Payer: Self-pay | Admitting: *Deleted

## 2008-04-22 ENCOUNTER — Encounter: Payer: Self-pay | Admitting: Family Medicine

## 2008-05-01 ENCOUNTER — Encounter (INDEPENDENT_AMBULATORY_CARE_PROVIDER_SITE_OTHER): Payer: Self-pay | Admitting: *Deleted

## 2008-05-01 ENCOUNTER — Telehealth (INDEPENDENT_AMBULATORY_CARE_PROVIDER_SITE_OTHER): Payer: Self-pay | Admitting: *Deleted

## 2008-06-08 ENCOUNTER — Ambulatory Visit: Payer: Self-pay | Admitting: Family Medicine

## 2008-07-17 ENCOUNTER — Telehealth: Payer: Self-pay | Admitting: *Deleted

## 2008-07-18 ENCOUNTER — Ambulatory Visit: Payer: Self-pay | Admitting: Family Medicine

## 2008-10-24 ENCOUNTER — Telehealth: Payer: Self-pay | Admitting: *Deleted

## 2008-11-16 ENCOUNTER — Encounter: Admission: RE | Admit: 2008-11-16 | Discharge: 2008-11-16 | Payer: Self-pay | Admitting: Family Medicine

## 2008-11-16 IMAGING — MG MM SCREEN MAMMOGRAM BILATERAL
4 series · 4 of 4 positions shown · non-contrast
Comparison: Prior studies.

DG SCREEN MAMMOGRAM BILATERAL
Bilateral CC and MLO view(s) were taken.

DIGITAL SCREENING MAMMOGRAM WITH CAD:

[R CC]
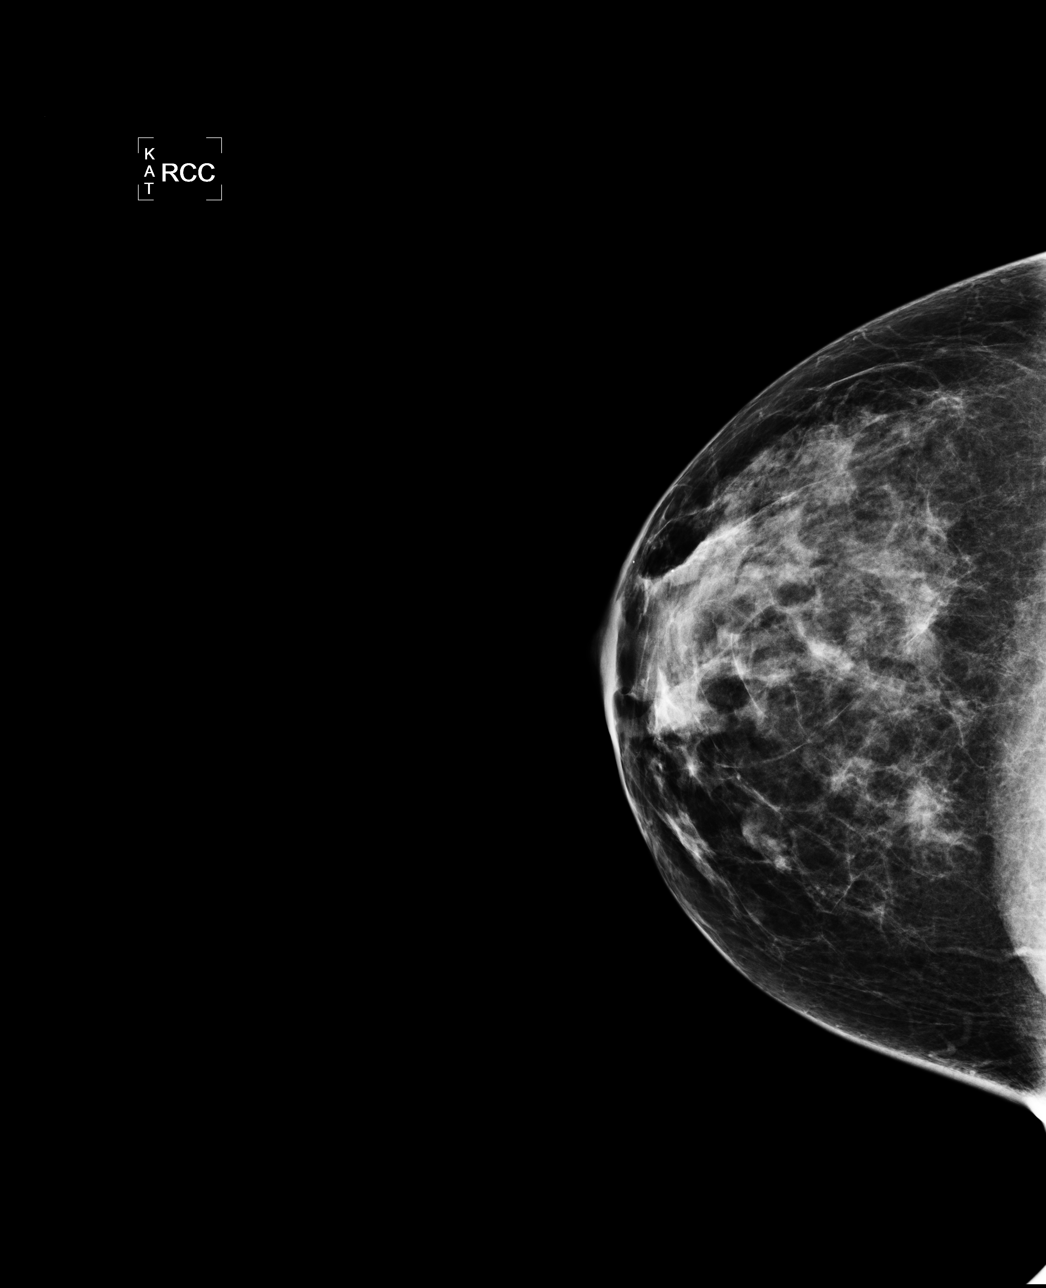

[L CC]
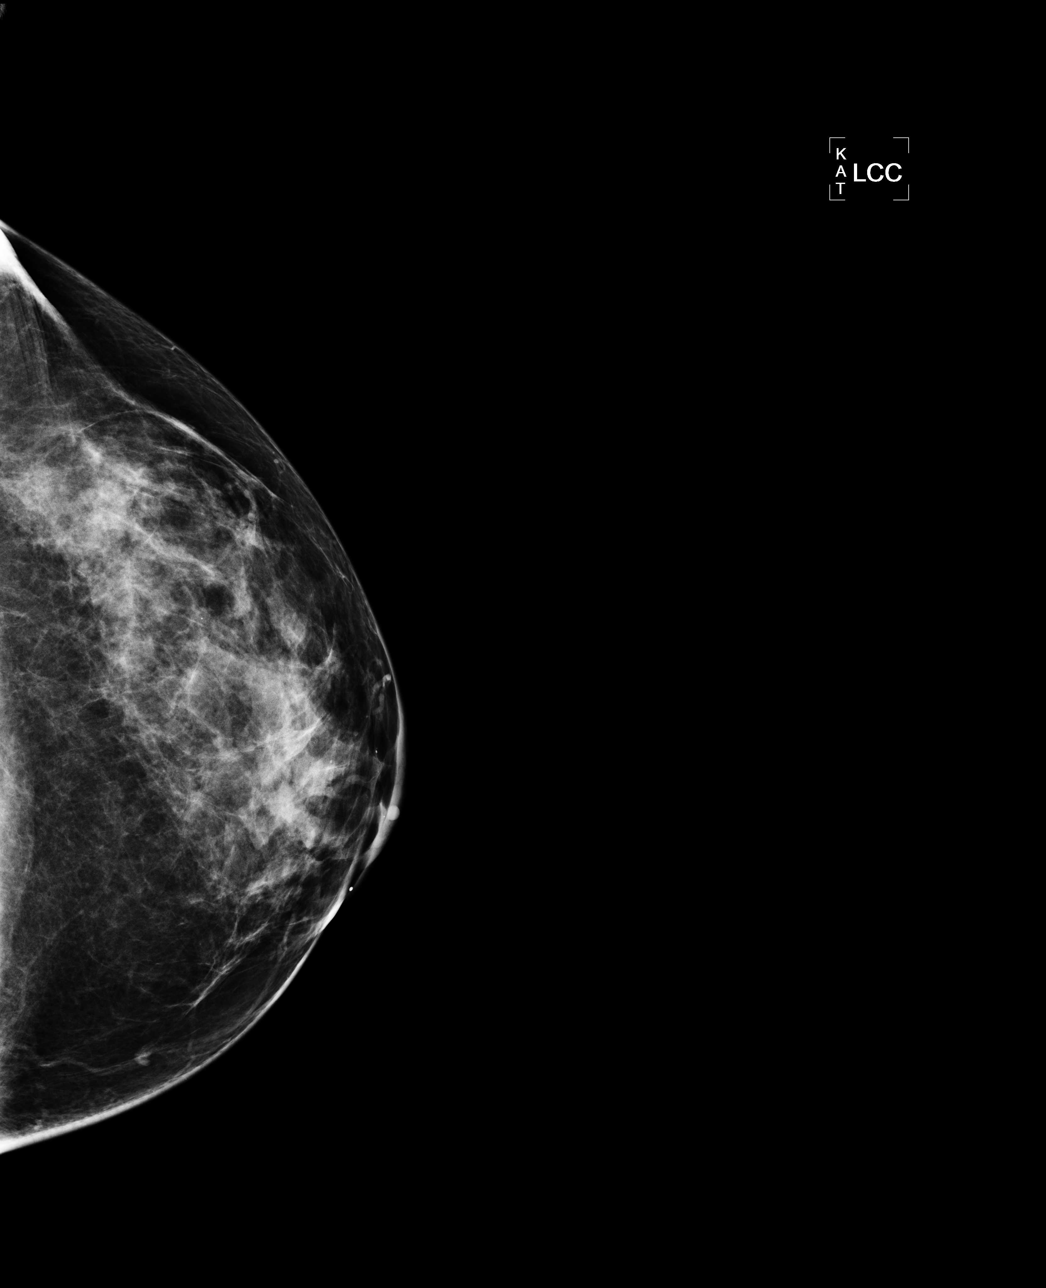

[L MLO]
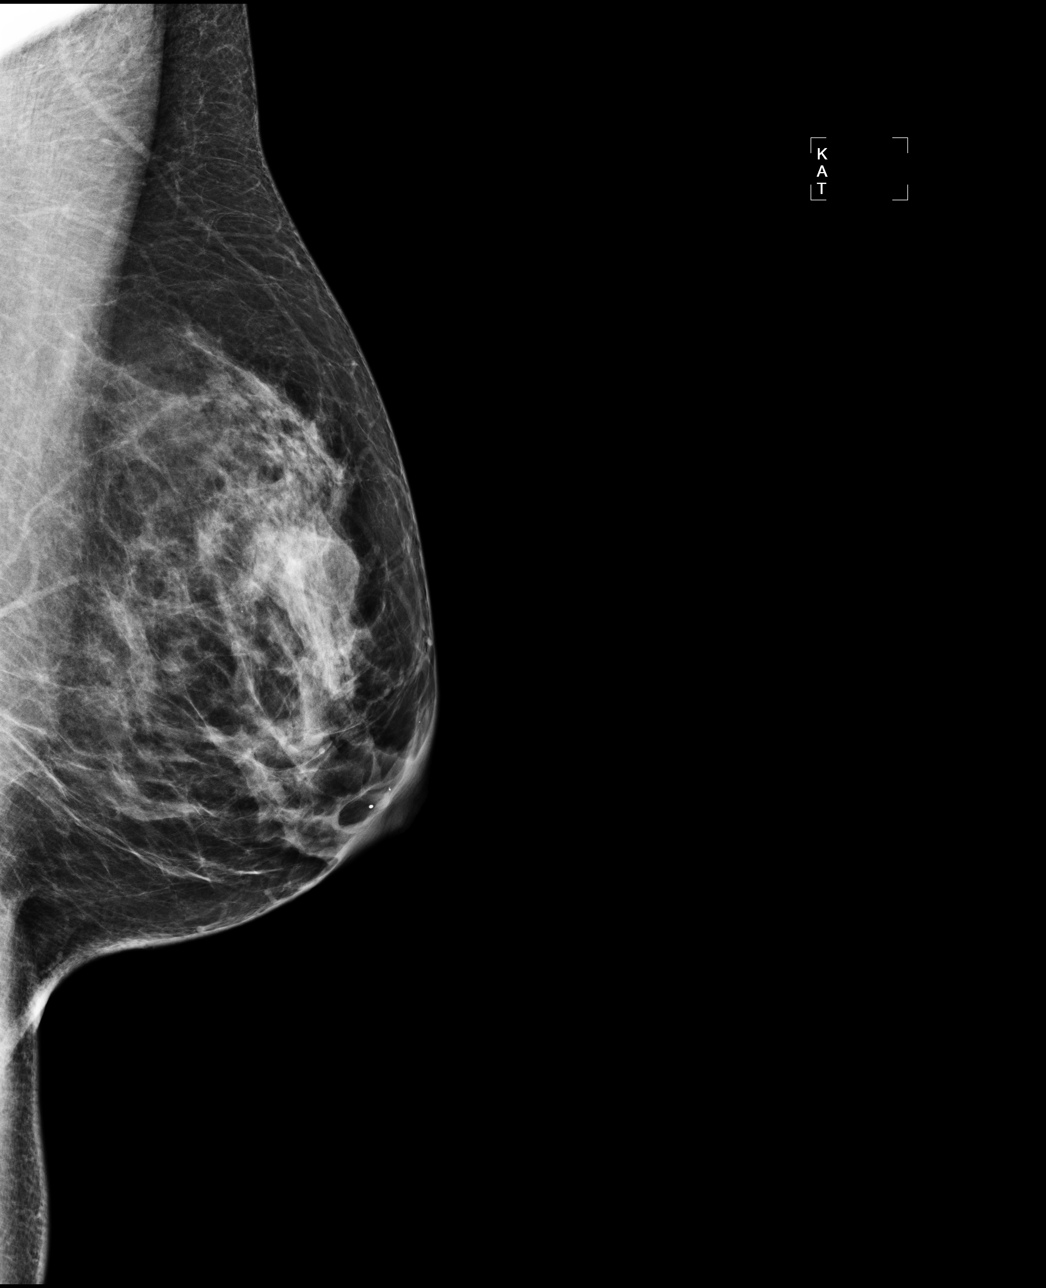

[R MLO]
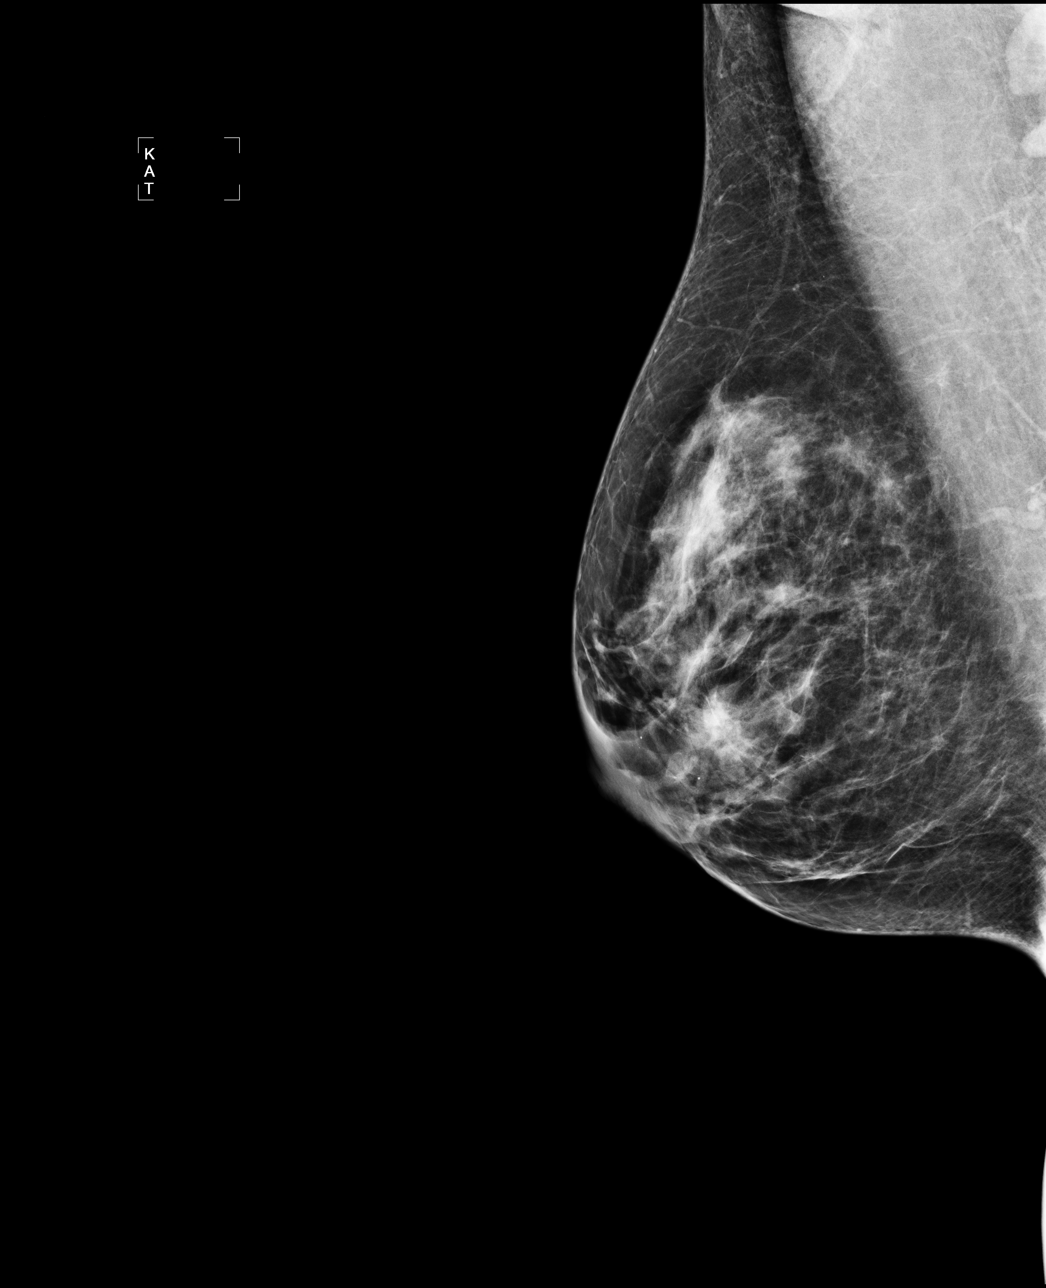

[4 of 4 positions shown; findings below may reference images not displayed]

The breast tissue is heterogeneously dense.  There is no dominant mass, architectural distortion or
calcification to suggest malignancy.
IMPRESSION: No mammographic evidence of malignancy.  Suggest yearly screening mammography.

A result letter of this screening mammogram will be mailed directly to the patient.

ASSESSMENT: Negative - BI-RADS 1

Screening mammogram in 1 year.
ANALYZED BY COMPUTER AIDED DETECTION. , THIS PROCEDURE WAS A DIGITAL MAMMOGRAM.

## 2008-11-17 ENCOUNTER — Telehealth: Payer: Self-pay | Admitting: Family Medicine

## 2008-12-12 ENCOUNTER — Telehealth: Payer: Self-pay | Admitting: Family Medicine

## 2009-01-09 ENCOUNTER — Ambulatory Visit (HOSPITAL_COMMUNITY): Admission: RE | Admit: 2009-01-09 | Discharge: 2009-01-09 | Payer: Self-pay | Admitting: Family Medicine

## 2009-01-09 ENCOUNTER — Encounter: Payer: Self-pay | Admitting: Family Medicine

## 2009-01-09 ENCOUNTER — Ambulatory Visit: Payer: Self-pay | Admitting: Family Medicine

## 2009-01-09 DIAGNOSIS — Z8679 Personal history of other diseases of the circulatory system: Secondary | ICD-10-CM | POA: Insufficient documentation

## 2009-01-09 DIAGNOSIS — R000 Tachycardia, unspecified: Secondary | ICD-10-CM

## 2009-01-10 ENCOUNTER — Ambulatory Visit: Payer: Self-pay | Admitting: Family Medicine

## 2009-01-10 ENCOUNTER — Encounter: Payer: Self-pay | Admitting: Family Medicine

## 2009-01-10 LAB — CONVERTED CEMR LAB
BUN: 13 mg/dL (ref 6–23)
Basophils Absolute: 0 10*3/uL (ref 0.0–0.1)
Basophils Relative: 0 % (ref 0–1)
Calcium: 9.8 mg/dL (ref 8.4–10.5)
Eosinophils Absolute: 0 10*3/uL (ref 0.0–0.7)
Eosinophils Relative: 0 % (ref 0–5)
HCT: 42.3 % (ref 36.0–46.0)
Lymphocytes Relative: 33 % (ref 12–46)
Lymphs Abs: 3 10*3/uL (ref 0.7–4.0)
MCHC: 33.3 g/dL (ref 30.0–36.0)
Monocytes Absolute: 0.7 10*3/uL (ref 0.1–1.0)
Potassium: 4.3 meq/L (ref 3.5–5.3)
RBC: 4.65 M/uL (ref 3.87–5.11)
RDW: 13.7 % (ref 11.5–15.5)
Sodium: 142 meq/L (ref 135–145)
Total Protein: 6.6 g/dL (ref 6.0–8.3)

## 2009-01-11 ENCOUNTER — Telehealth: Payer: Self-pay | Admitting: Family Medicine

## 2009-01-12 ENCOUNTER — Encounter: Payer: Self-pay | Admitting: Family Medicine

## 2009-01-16 ENCOUNTER — Ambulatory Visit: Payer: Self-pay | Admitting: Family Medicine

## 2009-01-16 ENCOUNTER — Telehealth: Payer: Self-pay | Admitting: Family Medicine

## 2009-01-16 DIAGNOSIS — F339 Major depressive disorder, recurrent, unspecified: Secondary | ICD-10-CM | POA: Insufficient documentation

## 2009-01-16 LAB — CONVERTED CEMR LAB: Whiff Test: NEGATIVE

## 2009-01-17 ENCOUNTER — Telehealth: Payer: Self-pay | Admitting: *Deleted

## 2009-02-09 ENCOUNTER — Encounter: Payer: Self-pay | Admitting: Family Medicine

## 2009-02-27 ENCOUNTER — Telehealth: Payer: Self-pay | Admitting: Family Medicine

## 2009-04-04 ENCOUNTER — Encounter: Payer: Self-pay | Admitting: Family Medicine

## 2009-04-04 ENCOUNTER — Ambulatory Visit: Payer: Self-pay | Admitting: Family Medicine

## 2009-04-05 ENCOUNTER — Encounter: Payer: Self-pay | Admitting: Family Medicine

## 2009-04-05 ENCOUNTER — Telehealth: Payer: Self-pay | Admitting: Family Medicine

## 2009-04-10 ENCOUNTER — Encounter: Payer: Self-pay | Admitting: Family Medicine

## 2009-04-18 ENCOUNTER — Ambulatory Visit: Payer: Self-pay | Admitting: Family Medicine

## 2009-04-18 ENCOUNTER — Encounter: Payer: Self-pay | Admitting: Family Medicine

## 2009-04-18 LAB — CONVERTED CEMR LAB: VLDL: 55 mg/dL — ABNORMAL HIGH (ref 0–40)

## 2009-04-19 ENCOUNTER — Encounter: Payer: Self-pay | Admitting: Family Medicine

## 2009-04-19 ENCOUNTER — Telehealth: Payer: Self-pay | Admitting: Family Medicine

## 2009-04-19 DIAGNOSIS — E782 Mixed hyperlipidemia: Secondary | ICD-10-CM

## 2009-04-20 ENCOUNTER — Telehealth: Payer: Self-pay | Admitting: *Deleted

## 2009-07-12 ENCOUNTER — Encounter: Payer: Self-pay | Admitting: Family Medicine

## 2009-08-01 ENCOUNTER — Ambulatory Visit: Payer: Self-pay | Admitting: Family Medicine

## 2009-10-04 ENCOUNTER — Ambulatory Visit: Payer: Self-pay | Admitting: Family Medicine

## 2009-10-04 DIAGNOSIS — L738 Other specified follicular disorders: Secondary | ICD-10-CM | POA: Insufficient documentation

## 2009-10-05 ENCOUNTER — Telehealth: Payer: Self-pay | Admitting: *Deleted

## 2009-10-19 ENCOUNTER — Ambulatory Visit: Payer: Self-pay | Admitting: Family Medicine

## 2009-10-19 ENCOUNTER — Encounter (INDEPENDENT_AMBULATORY_CARE_PROVIDER_SITE_OTHER): Payer: Self-pay | Admitting: Family Medicine

## 2009-10-19 LAB — CONVERTED CEMR LAB: Rapid Strep: NEGATIVE

## 2009-11-19 ENCOUNTER — Encounter: Admission: RE | Admit: 2009-11-19 | Discharge: 2009-11-19 | Payer: Self-pay | Admitting: Family Medicine

## 2009-11-19 IMAGING — MG MM DIGITAL SCREENING
5 series · 5 of 5 positions shown · non-contrast
Comparison: Prior studies.

DG SCREEN MAMMOGRAM BILATERAL
Bilateral CC and MLO view(s) were taken.
Technologist: Dyanes Medjina
Prior study comparison: November 03, 2006, DG screen mammogram bilateral.

DIGITAL SCREENING MAMMOGRAM WITH CAD:

[R CC]
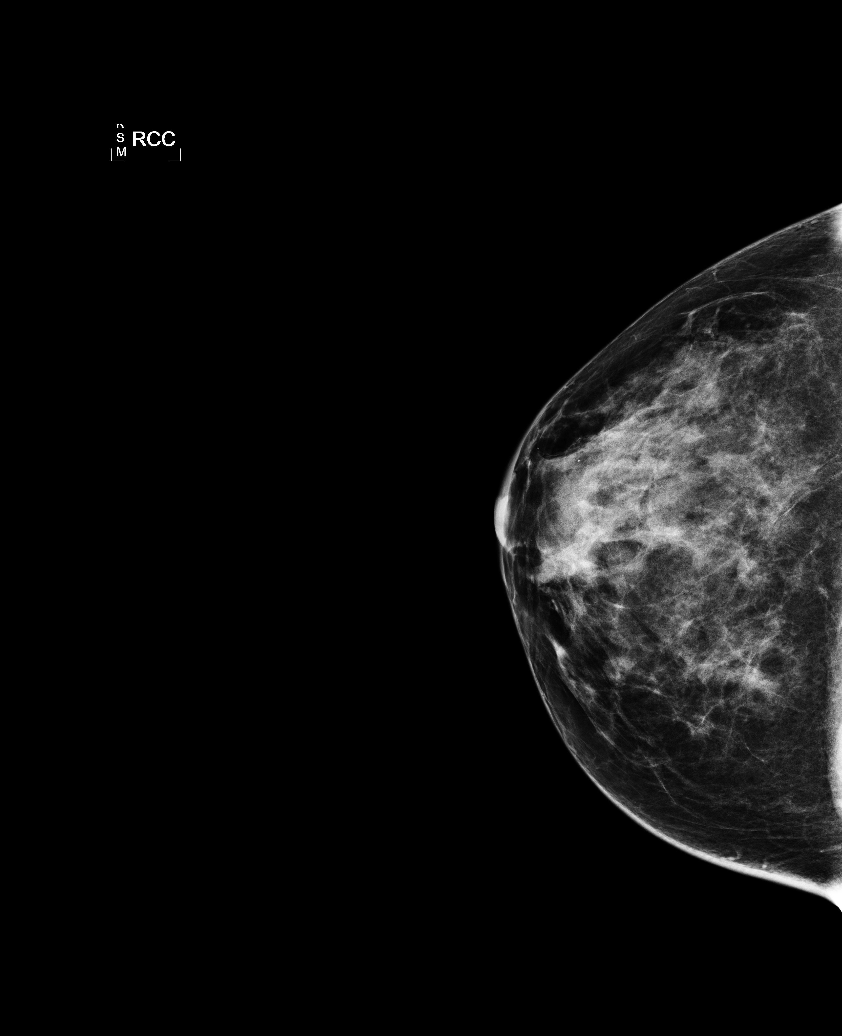

[L CC (1 of 2)]
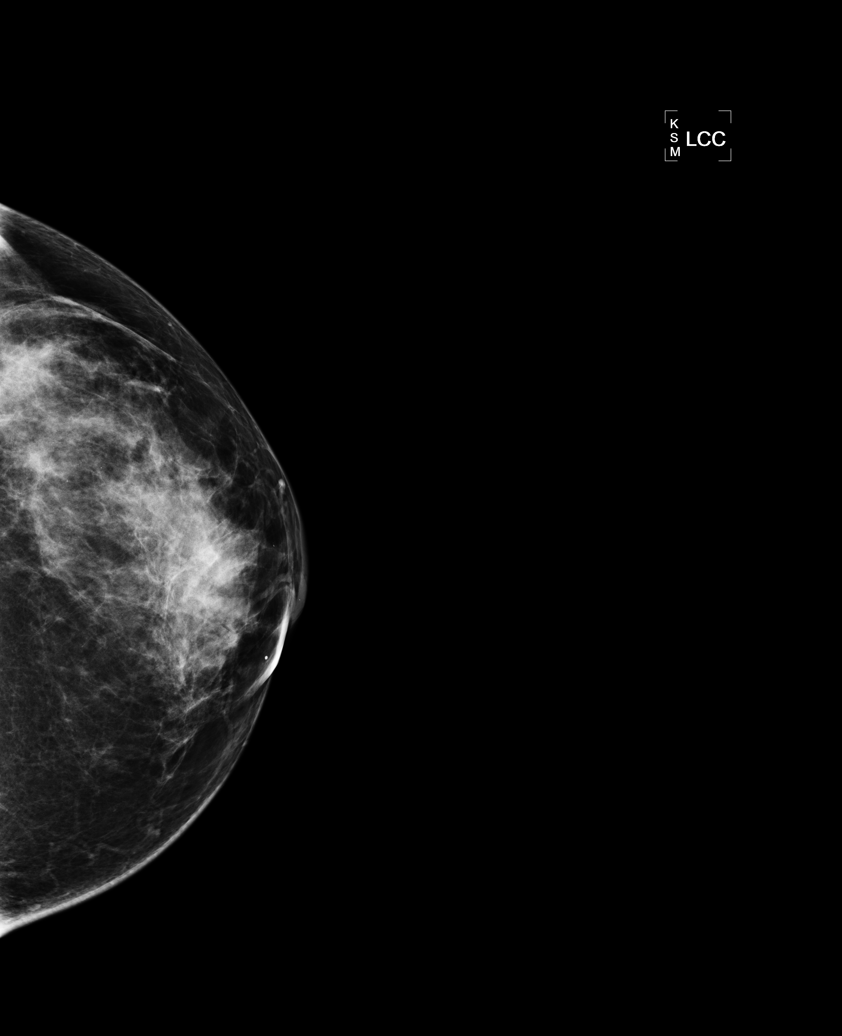

[L MLO]
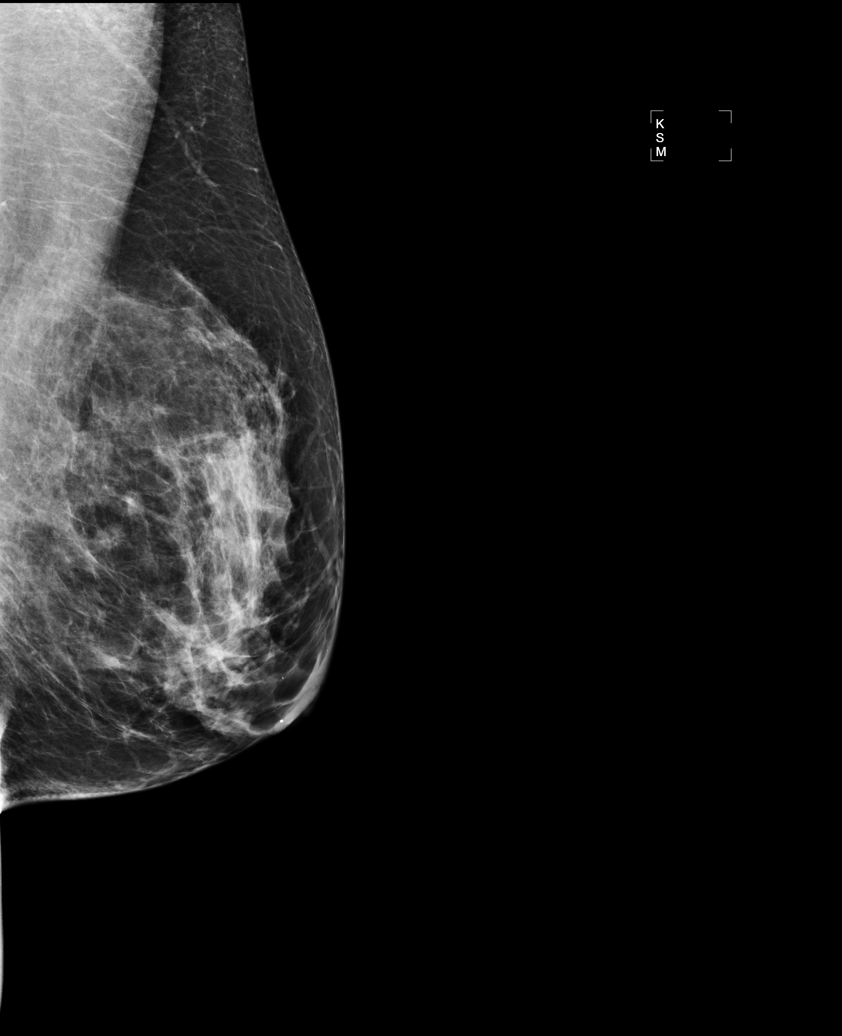

[R MLO]
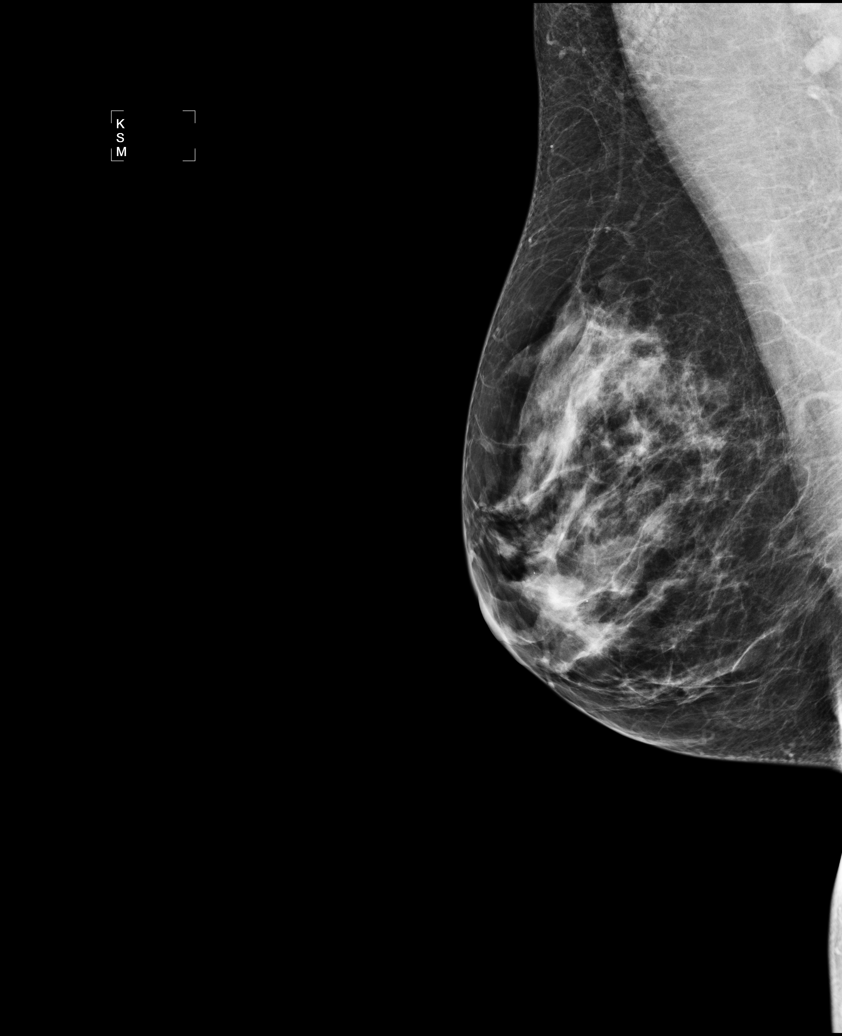

[L CC (2 of 2)]
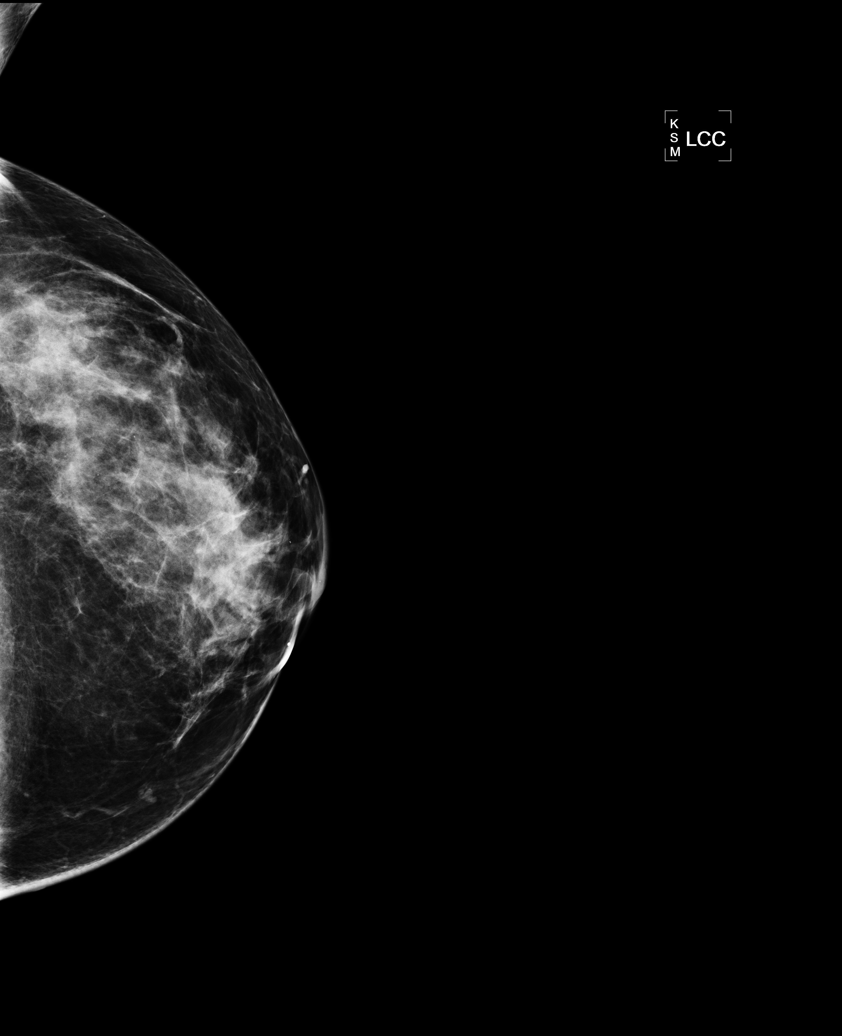

[5 of 5 positions shown; findings below may reference images not displayed]

The breast tissue is heterogeneously dense.  There is no dominant mass, architectural distortion or
calcification to suggest malignancy.

Images were processed with CAD.
IMPRESSION: No mammographic evidence of malignancy.  Suggest yearly screening mammography.

A result letter of this screening mammogram will be mailed directly to the patient.

ASSESSMENT: Negative - BI-RADS 1

Screening mammogram in 1 year.
,

## 2010-02-05 ENCOUNTER — Encounter: Payer: Self-pay | Admitting: Family Medicine

## 2010-02-12 ENCOUNTER — Telehealth (INDEPENDENT_AMBULATORY_CARE_PROVIDER_SITE_OTHER): Payer: Self-pay | Admitting: *Deleted

## 2010-03-12 ENCOUNTER — Encounter: Payer: Self-pay | Admitting: *Deleted

## 2010-04-14 ENCOUNTER — Encounter: Payer: Self-pay | Admitting: Family Medicine

## 2010-04-15 ENCOUNTER — Ambulatory Visit: Payer: Self-pay | Admitting: Family Medicine

## 2010-04-19 ENCOUNTER — Encounter: Payer: Self-pay | Admitting: Family Medicine

## 2010-04-19 ENCOUNTER — Ambulatory Visit: Payer: Self-pay | Admitting: Family Medicine

## 2010-04-19 LAB — CONVERTED CEMR LAB
AST: 17 units/L (ref 0–37)
Albumin: 4.4 g/dL (ref 3.5–5.2)
Alkaline Phosphatase: 76 units/L (ref 39–117)
CO2: 23 meq/L (ref 19–32)
Calcium: 9.6 mg/dL (ref 8.4–10.5)
Cholesterol: 195 mg/dL (ref 0–200)
Creatinine, Ser: 0.9 mg/dL (ref 0.40–1.20)
Glucose, Bld: 102 mg/dL — ABNORMAL HIGH (ref 70–99)
HDL: 40 mg/dL (ref >39)
LDL Cholesterol: 81 mg/dL (ref 0–99)
Potassium: 4.5 meq/L (ref 3.5–5.3)
Total CHOL/HDL Ratio: 4.9

## 2010-05-01 ENCOUNTER — Encounter: Payer: Self-pay | Admitting: Family Medicine

## 2010-06-19 ENCOUNTER — Ambulatory Visit: Payer: Self-pay | Admitting: Family Medicine

## 2010-06-19 DIAGNOSIS — J069 Acute upper respiratory infection, unspecified: Secondary | ICD-10-CM | POA: Insufficient documentation

## 2010-06-19 DIAGNOSIS — L2989 Other pruritus: Secondary | ICD-10-CM | POA: Insufficient documentation

## 2010-06-19 DIAGNOSIS — L298 Other pruritus: Secondary | ICD-10-CM

## 2010-07-12 ENCOUNTER — Ambulatory Visit: Admission: RE | Admit: 2010-07-12 | Discharge: 2010-07-12 | Payer: Self-pay | Source: Home / Self Care

## 2010-08-08 NOTE — Letter (Signed)
Summary: Out of Work  [facility]  [address]   [place], [place] [address]   Phone: [phone]  Fax: [phone]    October 19, 2009   Employee:  Jean Williams    To Whom It May Concern:   For Medical reasons, please excuse the above named employee from work for the following dates:  Start:   10/19/2009  End:   10/20/2009  If you need additional information, please feel free to contact our office.         Sincerely,    Jettie Pagan MD

## 2010-08-08 NOTE — Assessment & Plan Note (Signed)
Summary: boils on bottom,tcb   Vital Signs:  Patient profile:   50 year old female Height:      66.0 inches Weight:      173 pounds BMI:     28.02 BSA:     1.88 Temp:     98.5 degrees F Pulse rate:   110 / minute BP sitting:   121 / 83  Vitals Entered By: Jone Baseman CMA (October 04, 2009 10:37 AM) CC: BOPILS ON BOTTOM X 10 DAYS Is Patient Diabetic? No Pain Assessment Patient in pain? no        Primary Care Provider:  Romero Belling MD  CC:  BOPILS ON BOTTOM X 10 DAYS.  History of Present Illness: Boils on rear have been there for about a week and seem to be getting better.  One drained she thinks a few days ago.  Are painful but less so.  No fever or lesions elsewhere.  Not taking any antibiotics  ROS - as above PMH - Medications reviewed and updated in medication list.  Smoking Status noted in VS form    Habits & Providers  Alcohol-Tobacco-Diet     Tobacco Status: current     Tobacco Counseling: to quit use of tobacco products     Cigarette Packs/Day: 1.0  Current Medications (verified): 1)  Paxil Cr 25 Mg  Xr24h-Tab (Paroxetine Hcl) .Marland Kitchen.. 1 Tab By Mouth Daily 2)  Famotidine 40 Mg Tabs (Famotidine) .Marland Kitchen.. 1 Tab By Mouth Two Times A Day 3)  Colace 100 Mg  Caps (Docusate Sodium) .Marland Kitchen.. 1 Tablet By Mouth Two Times A Day 4)  Theratrum Complete   Tabs (Multiple Vitamins-Minerals) .Marland Kitchen.. 1 Tab By Mouth Daily 5)  Oyster Shell Calcium/vitamin D 500-200 Mg-Unit  Tabs (Calcium Carbonate-Vitamin D) .Marland Kitchen.. 1 Tab By Mouth Two Times A Day 6)  Tylenol 325 Mg  Tabs (Acetaminophen) .... 2 Tabs By Mouth Q4h As Needed Pain/fever 7)  Proair Hfa 108 (90 Base) Mcg/act  Aers (Albuterol Sulfate) .... 2 Puffs Inhaled Q4h As Needed Shortness of Breath/wheeze 8)  Flovent Hfa 110 Mcg/act  Aero (Fluticasone Propionate  Hfa) .Marland Kitchen.. 1 Puff Inhaled Two Times A Day 9)  Clozapine 100 Mg  Tabs (Clozapine) .... 1/2 Tab By Mouth Qam, 3 Tabs By Mouth Qpm 10)  Fluconazole 150 Mg Tabs (Fluconazole) .... Take 1  Tablet Now If No Relief May Take Other Tablet 72 Hours (3 Days) Later 11)  Simvastatin 20 Mg Tabs (Simvastatin) .Marland Kitchen.. 1 Tab By Mouth Daily For High Cholesterol  Allergies: 1)  Penicillin G Potassium (Penicillin G Potassium)  Physical Exam  General:  Well-developed,well-nourished,in no acute distress; alert,appropriate and cooperative throughout examination Skin:  Buttock area - 2 areas approximately 1 cm each firm but no fluctuance red no surrounding erythema Are not near rectum    Impression & Recommendations:  Problem # 1:  FOLLICULITIS (ICD-704.8) Assessment New  No signs of abscess or systemic symptoms.  Treat with baths and topical antibiotics   Orders: [facility]  3 ([address])  Complete Medication List: 1)  Paxil Cr 25 Mg Xr24h-tab (Paroxetine hcl) .Marland Kitchen.. 1 tab by mouth daily 2)  Famotidine 40 Mg Tabs (Famotidine) .Marland Kitchen.. 1 tab by mouth two times a day 3)  Colace 100 Mg Caps (Docusate sodium) .Marland Kitchen.. 1 tablet by mouth two times a day 4)  Theratrum Complete Tabs (Multiple vitamins-minerals) .Marland Kitchen.. 1 tab by mouth daily 5)  Oyster Shell Calcium/vitamin D 500-200 Mg-unit Tabs (Calcium carbonate-vitamin d) .Marland Kitchen.. 1 tab by mouth  two times a day 6)  Tylenol 325 Mg Tabs (Acetaminophen) .... 2 tabs by mouth q4h as needed pain/fever 7)  Proair Hfa 108 (90 Base) Mcg/act Aers (Albuterol sulfate) .... 2 puffs inhaled q4h as needed shortness of breath/wheeze 8)  Flovent Hfa 110 Mcg/act Aero (Fluticasone propionate  hfa) .Marland Kitchen.. 1 puff inhaled two times a day 9)  Clozapine 100 Mg Tabs (Clozapine) .... 1/2 tab by mouth qam, 3 tabs by mouth qpm 10)  Fluconazole 150 Mg Tabs (Fluconazole) .... Take 1 tablet now if no relief may take other tablet 72 hours (3 days) later 11)  Simvastatin 20 Mg Tabs (Simvastatin) .Marland Kitchen.. 1 tab by mouth daily for high cholesterol 12)  Bactroban 2 % Oint (Mupirocin) .... Use two times a day on your sores 1tube  Patient Instructions: 1)  Soak in warm soapy water two times a day 2)   Use the ointment two times a day after the bath 3)  If the boils do not completel go away or if they get bigger come back 4)  Use Lever 2000 antibacterial soap to prevent these Prescriptions: BACTROBAN 2 % OINT (MUPIROCIN) use two times a day on your sores 1tube  #1 x 0   Entered and Authorized by:   Pearlean Brownie MD   Signed by:   Pearlean Brownie MD on 10/04/2009   Method used:   Electronically to        [facility]* (retail)       [address]       [place], [place]  [id number]       Ph: [phone]       Fax: [phone]   RxID:   [id number]

## 2010-08-08 NOTE — Miscellaneous (Signed)
Summary: Pecid Refill  Clinical Lists Changes  Medications: Changed medication from PEPCID AC 10 MG  TABS (FAMOTIDINE) 4 tabs by mouth every morning, 4 tabs by mouth at bedtime to FAMOTIDINE 40 MG TABS (FAMOTIDINE) 1 tab by mouth two times a day - Signed Rx of FAMOTIDINE 40 MG TABS (FAMOTIDINE) 1 tab by mouth two times a day;  #60 x 3;  Signed;  Entered by: Romero Belling MD;  Authorized by: Romero Belling MD;  Method used: Electronically to [facility]*, [address], [place], [place]  [id number], Ph: [phone], Fax: [phone]    Prescriptions: FAMOTIDINE 40 MG TABS (FAMOTIDINE) 1 tab by mouth two times a day  #60 x 3   Entered and Authorized by:   Romero Belling MD   Signed by:   Romero Belling MD on 07/12/2009   Method used:   Electronically to        [facility]* (retail)       [address]       [place], [place]  [id number]       Ph: [phone]       Fax: [phone]   RxID:   [id number]

## 2010-08-08 NOTE — Letter (Signed)
Summary: Lipid Letter  [facility]  [address]   [place], [place] [address]   Phone: [phone]  Fax: [phone]    05/01/2010  Romeo Apple [address] [place], [place]  [address]  Dear Jean Williams:  We have carefully reviewed your last lipid profile from 04/19/2010 and the results are noted below with a summary of recommendations for lipid management.    Cholesterol:       195     Goal: < 200   HDL "good" Cholesterol:   40     Goal: > 40   LDL "bad" Cholesterol:   81     Goal: < 100   Triglycerides:       368     Goal: < 150        TLC Diet (Therapeutic Lifestyle Change): Saturated Fats & Transfatty acids should be kept < 7% of total calories ***Reduce Saturated Fats Polyunstaurated Fat can be up to 10% of total calories Monounsaturated Fat Fat can be up to 20% of total calories Total Fat should be no greater than 25-35% of total calories Carbohydrates should be 50-60% of total calories Protein should be approximately 15% of total calories Fiber should be at least 20-30 grams a day ***Increased fiber may help lower LDL Total Cholesterol should be < 200mg /day Consider adding plant stanol/sterols to diet (example: Benacol spread) ***A higher intake of unsaturated fat may reduce Triglycerides and Increase HDL    Adjunctive Measures (may lower LIPIDS and reduce risk of Heart Attack) include: Aerobic Exercise (20-30 minutes 3-4 times a week) Limit Alcohol Consumption Weight Reduction Aspirin 75-81 mg a day by mouth (if not allergic or contraindicated) Dietary Fiber 20-30 grams a day by mouth     Current Medications: 1)    Paxil Cr 25 Mg  Xr24h-tab (Paroxetine hcl) .Marland Kitchen.. 1 tab by mouth daily 2)    Famotidine 40 Mg Tabs (Famotidine) .Marland Kitchen.. 1 tab by mouth two times a day 3)    Colace 100 Mg  Caps (Docusate sodium) .Marland Kitchen.. 1 tablet by mouth two times a day 4)    Theratrum Complete   Tabs (Multiple vitamins-minerals) .Marland Kitchen.. 1 tab by mouth daily 5)    Oyster  Shell Calcium/vitamin D 500-200 Mg-unit  Tabs (Calcium carbonate-vitamin d) .Marland Kitchen.. 1 tab by mouth two times a day 6)    Tylenol 325 Mg  Tabs (Acetaminophen) .... 2 tabs by mouth q4h as needed pain/fever 7)    Proair Hfa 108 (90 Base) Mcg/act  Aers (Albuterol sulfate) .... 2 puffs inhaled q4h as needed shortness of breath/wheeze 8)    Flovent Hfa 110 Mcg/act  Aero (Fluticasone propionate  hfa) .Marland Kitchen.. 1 puff inhaled two times a day 9)    Clozapine 100 Mg  Tabs (Clozapine) .... 1/2 tab by mouth qam, 3 tabs by mouth qpm 10)    Simvastatin 20 Mg Tabs (Simvastatin) .Marland Kitchen.. 1 tab by mouth daily for high cholesterol 11)    Theratrum Complete  Tabs (Multiple vitamins-minerals) .Marland Kitchen.. 1 by mouth daily  If you have any questions, please call. We appreciate being able to work with you.   Sincerely,    [facility] Dessa Phi MD

## 2010-08-08 NOTE — Miscellaneous (Signed)
Summary: med list update  Clinical Lists Changes med list updated and placed in to be faxed for [facility] Sarah Swaziland MD  February 05, 2010 9:24 AM  Medications: Removed medication of FLUCONAZOLE 150 MG TABS (FLUCONAZOLE) Take 1 tablet now If no relief may take other tablet 72 hours (3 days) later Removed medication of BACTROBAN 2 % OINT (MUPIROCIN) use two times a day on your sores 1tube Added new medication of THERATRUM COMPLETE  TABS (MULTIPLE VITAMINS-MINERALS) 1 by mouth daily

## 2010-08-08 NOTE — Miscellaneous (Signed)
Summary: refills in [place]?  Clinical Lists Changes rec'd refill request for tylenol & simvastatin from a pharmacy in [place]. LM at her home in [place] asking that she call us when she gets a chance. living here? what local pharmacy?Marland KitchenGolden Circle RN  March 12, 2010 1:45 PM  Dennie Bible called back with name of local pharmacy.  [facility], [id number] Abundio Miu  March 14, 2010 8:41 AM  Medications: Rx of TYLENOL 325 MG  TABS (ACETAMINOPHEN) 2 tabs by mouth q4h as needed pain/fever;  #0 x 0;  Signed;  Entered by: Golden Circle RN;  Authorized by: Sarah Swaziland MD;  Method used: Electronically to [facility]*, [address] [place], [place]  [id number], Ph: [phone], Fax: [phone] Rx of SIMVASTATIN 20 MG TABS (SIMVASTATIN) 1 tab by mouth daily for high cholesterol;  #30 x 2;  Signed;  Entered by: Golden Circle RN;  Authorized by: Sarah Swaziland MD;  Method used: Electronically to [facility]*, [address], [place], [place]  [id number], Ph: [phone], Fax: [phone]    Prescriptions: SIMVASTATIN 20 MG TABS (SIMVASTATIN) 1 tab by mouth daily for high cholesterol  #30 x 2   Entered by:   Golden Circle RN   Authorized by:   Sarah Swaziland MD   Signed by:   Golden Circle RN on 03/14/2010   Method used:   Electronically to        [facility]* (retail)       [address]       [place], [place]  [id number]       Ph: [phone]       Fax: [phone]   RxID:   [id number] TYLENOL 325 MG  TABS (ACETAMINOPHEN) 2 tabs by mouth q4h as needed pain/fever  #0 x 0   Entered by:   Golden Circle RN   Authorized by:   Sarah Swaziland MD   Signed by:   Golden Circle RN on 03/14/2010   Method used:   Electronically to        [facility]* (retail)       [address]       [place], [place]  [id number]       Ph: [phone]       Fax: [phone]   RxID:   [id number]

## 2010-08-08 NOTE — Assessment & Plan Note (Signed)
Summary: PHY/PAP/[facility](resch'd from 9/22)/[facility]   FLU SHOT GIVEN TODAY.Jimmy Footman, CMA  April 15, 2010 4:28 PM   Vital Signs:  Patient profile:   50 year old female Height:      66.0 inches Weight:      180 pounds BMI:     29.16 Temp:     99.6 degrees F oral Pulse rate:   116 / minute BP sitting:   122 / 84  (right arm) Cuff size:   regular  Vitals Entered By: Jimmy Footman, CMA (April 15, 2010 3:51 PM) CC: cpe/flu shot Is Patient Diabetic? No   Primary Care Shakerria Parran:  Dessa Phi MD  CC:  cpe/flu shot.  History of Present Illness: 1) Prevention: Pap in 2008, 2010 wnl. Last mammogram May 2010. Requests flu vaccine today.   2) [facility] Currently stable on medications as below.   3) HLD: Last lipids 04/18/09 - trig = 277, HDL = 42, LDL = 157. Taking statin w/o side effects as below.   4) Tobacco use: Precontemplative. 1.5 packs per day.  Habits & Providers  Alcohol-Tobacco-Diet     Tobacco Status: never  Current Medications (verified): 1)  Paxil Cr 25 Mg  Xr24h-Tab (Paroxetine Hcl) .Marland Kitchen.. 1 Tab By Mouth Daily 2)  Famotidine 40 Mg Tabs (Famotidine) .Marland Kitchen.. 1 Tab By Mouth Two Times A Day 3)  Colace 100 Mg  Caps (Docusate Sodium) .Marland Kitchen.. 1 Tablet By Mouth Two Times A Day 4)  Theratrum Complete   Tabs (Multiple Vitamins-Minerals) .Marland Kitchen.. 1 Tab By Mouth Daily 5)  Oyster Shell Calcium/vitamin D 500-200 Mg-Unit  Tabs (Calcium Carbonate-Vitamin D) .Marland Kitchen.. 1 Tab By Mouth Two Times A Day 6)  Tylenol 325 Mg  Tabs (Acetaminophen) .... 2 Tabs By Mouth Q4h As Needed Pain/fever 7)  Proair Hfa 108 (90 Base) Mcg/act  Aers (Albuterol Sulfate) .... 2 Puffs Inhaled Q4h As Needed Shortness of Breath/wheeze 8)  Flovent Hfa 110 Mcg/act  Aero (Fluticasone Propionate  Hfa) .Marland Kitchen.. 1 Puff Inhaled Two Times A Day 9)  Clozapine 100 Mg  Tabs (Clozapine) .... 1/2 Tab By Mouth Qam, 3 Tabs By Mouth Qpm 10)  Simvastatin 20 Mg Tabs (Simvastatin) .Marland Kitchen.. 1 Tab By Mouth Daily For High  Cholesterol 11)  Theratrum Complete  Tabs (Multiple Vitamins-Minerals) .Marland Kitchen.. 1 By Mouth Daily  Allergies (verified): 1)  Penicillin G Potassium (Penicillin G Potassium)  Social History: Smoking Status:  never  Physical Exam  General:  overweight, mildly agitated, NAD  Eyes:  pupils equal, round and reactive to light , extraoccular movements intact   Mouth:  moist membranes  Neck:  no thyromegaly noted  Lungs:  CTAB w/o wheeze or crackles  Heart:  tachycardia to 100, regular rhythm, no murmurs Abdomen:  Bowel sounds positive,abdomen soft and non-tender without masses, organomegaly or hernias noted. Pulses:  2+ radials  Extremities:  no LE edema  Neurologic:  alert & oriented X3 and cranial nerves II-XII intact.   Skin:  rosacea at bilateral malar region, nose  Psych:  Moderately anxious, mildly agitated, poor eye contact, no suicidal or homicidal ideation, no hallucinations,      Impression & Recommendations:  Problem # 1:  Preventive Health Care (ICD-V70.0) Flu vaccine today. Self referral for mammogram. Will defer Pap today given patient anxiety - PCP may consider q3 year intervals given last two normal Pap.   Problem # 2:  SCHIZOPHRENIA (ICD-295.90) Stable (patient is moderately anxious at baseline per review of notes). Will defer treatment to outpatient  psychiatry.  Problem # 3:  TOBACCO DEPENDENCE (ICD-305.1) Assessment: Unchanged Precontemplative. Advised regarding risks, regarding follow up with PCP if she would like to quit.   Problem # 4:  MIXED HYPERLIPIDEMIA (ICD-272.2) Assessment: Unchanged Will check CMET, lipid panel this week. Follow up with PCP in three months.   Her updated medication list for this problem includes:    Simvastatin 20 Mg Tabs (Simvastatin) .Marland Kitchen... 1 tab by mouth daily for high cholesterol  Future Orders: Comp Met-[facility] ([id number]) ... 04/24/2011 Lipid-[facility] ([id number]) ... 04/18/2011  Labs Reviewed: SGOT: 16 (01/10/2009)   SGPT: 16  (01/10/2009)   HDL:42 (04/18/2009)  LDL:157 (04/18/2009)  Chol:254 (04/18/2009)  Trig:277 (04/18/2009)  Complete Medication List: 1)  Paxil Cr 25 Mg Xr24h-tab (Paroxetine hcl) .Marland Kitchen.. 1 tab by mouth daily 2)  Famotidine 40 Mg Tabs (Famotidine) .Marland Kitchen.. 1 tab by mouth two times a day 3)  Colace 100 Mg Caps (Docusate sodium) .Marland Kitchen.. 1 tablet by mouth two times a day 4)  Theratrum Complete Tabs (Multiple vitamins-minerals) .Marland Kitchen.. 1 tab by mouth daily 5)  Oyster Shell Calcium/vitamin D 500-200 Mg-unit Tabs (Calcium carbonate-vitamin d) .Marland Kitchen.. 1 tab by mouth two times a day 6)  Tylenol 325 Mg Tabs (Acetaminophen) .... 2 tabs by mouth q4h as needed pain/fever 7)  Proair Hfa 108 (90 Base) Mcg/act Aers (Albuterol sulfate) .... 2 puffs inhaled q4h as needed shortness of breath/wheeze 8)  Flovent Hfa 110 Mcg/act Aero (Fluticasone propionate  hfa) .Marland Kitchen.. 1 puff inhaled two times a day 9)  Clozapine 100 Mg Tabs (Clozapine) .... 1/2 tab by mouth qam, 3 tabs by mouth qpm 10)  Simvastatin 20 Mg Tabs (Simvastatin) .Marland Kitchen.. 1 tab by mouth daily for high cholesterol 11)  Theratrum Complete Tabs (Multiple vitamins-minerals) .Marland Kitchen.. 1 by mouth daily  Other Orders: Influenza Vaccine MCR (00025) [facility] - Est  40-64 yrs (29562)  Patient Instructions: 1)  Follow up with Dr. Armen Pickup in 3 months.  2)  Come back this week in the morning for fasting labwork 3)  We will check your cholesterol and liver and kidney numbers then. 4)  Try to think about quitting smoking.  5)  Continue your medications as before.    Immunizations Administered:  Influenza Vaccine # 1:    Vaccine Type: Fluvax MCR    Site: right deltoid    Mfr: Sanofi Pasteur    Dose: 0.5 ml    Route: IM    Given by: Jimmy Footman, CMA    Exp. Date: 01/01/2011    Lot #: [id number]    VIS given: 01/29/10 version given April 15, 2010.  Flu Vaccine Consent Questions:    Do you have a history of severe allergic reactions to this vaccine? no    Any prior history of allergic  reactions to egg and/or gelatin? no    Do you have a sensitivity to the preservative Thimersol? no    Do you have a past history of Guillan-Barre Syndrome? no    Do you currently have an acute febrile illness? no    Have you ever had a severe reaction to latex? no    Vaccine information given and explained to patient? yes    Are you currently pregnant? no   Prevention & Chronic Care Immunizations   Influenza vaccine: Fluvax MCR  (04/15/2010)   Influenza vaccine due: 03/28/2009    Tetanus booster: 01/10/2006: Done.   Tetanus booster due: 01/11/2016    Pneumococcal vaccine: Done.  (06/06/1993)   Pneumococcal vaccine due: None  Other Screening  Pap smear: NEGATIVE FOR INTRAEPITHELIAL LESIONS OR MALIGNANCY.  (04/04/2009)   Pap smear action/deferral: Ordered  (04/04/2009)   Pap smear due: 11/04/2008    Mammogram: ASSESSMENT: Negative - BI-RADS 1^MM DIGITAL SCREENING  (11/19/2009)   Mammogram due: 11/16/2009   Smoking status: never  (04/15/2010)  Lipids   Total Cholesterol: 254  (04/18/2009)   Lipid panel action/deferral: Lipid Panel ordered   LDL: 157  (04/18/2009)   LDL Direct: Not documented   HDL: 42  (04/18/2009)   Triglycerides: 277  (04/18/2009)    SGOT (AST): 16  (01/10/2009)   SGPT (ALT): 16  (01/10/2009) CMP ordered    Alkaline phosphatase: 74  (01/10/2009)   Total bilirubin: 0.4  (01/10/2009)    Lipid flowsheet reviewed?: Yes   Progress toward LDL goal: Unchanged  Self-Management Support :    Patient will work on the following items until the next clinic visit to reach self-care goals:     Medications and monitoring: take my medicines every day, bring all of my medications to every visit  (04/15/2010)     Eating: drink diet soda or water instead of juice or soda, eat more vegetables, use fresh or frozen vegetables, eat foods that are low in salt, eat baked foods instead of fried foods, eat fruit for snacks and desserts, limit or avoid alcohol  (04/15/2010)      Activity: take a 30 minute walk every day  (04/15/2010)    Lipid self-management support: Written self-care plan  (04/15/2010)   Lipid self-care plan printed.

## 2010-08-08 NOTE — Assessment & Plan Note (Signed)
Summary: F/U EAR PAIN & BP/[facility]   Vital Signs:  Patient profile:   50 year old female Height:      66.0 inches Weight:      181.50 pounds BMI:     29.40 BSA:     1.92 Temp:     98.1 degrees F Pulse rate:   80 / minute BP sitting:   120 / 84  Vitals Entered By: Jone Baseman CMA (July 12, 2010 3:11 PM) CC: needs orders and BP check Is Patient Diabetic? No Pain Assessment Patient in pain? no        Primary Provider:  Dessa Phi MD  CC:  needs orders and BP check.  History of Present Illness: Pt here to have BP check and to f/u ear irritation.   BP- WNL.  Ear-both ears were itchy. R ear found to have minimal scaling in external canal on pervious physical exam. Pt lives in group b/c of her Schizophrenia. Using ear dops. No longer experiencing ear irritation, denies ear pain, denies changes in hearing.   Pt has ? regarding insurance. Has recently received 2 bills. $50 and $75 following ? for labs. Also reports being contacted by bill collectors.  Pt has medicare.    Habits & Providers  Alcohol-Tobacco-Diet     Tobacco Status: current     Tobacco Counseling: to quit use of tobacco products     Cigarette Packs/Day: 1.5  Allergies: 1)  Penicillin G Potassium (Penicillin G Potassium)  Social History: Packs/Day:  1.5  Review of Systems       As per HPI.  Physical Exam  General:  Vitals reviewed.  normotensive. overweight, mildly anxious, NAD  Ears:  External ear exam shows no significant lesions or deformities.  Otoscopic examination reveals clear canals, tympanic membranes are intact bilaterally without bulging, retraction, inflammation or discharge. Hearing is grossly normal bilaterally.   Impression & Recommendations:  Problem # 1:  ELEVATED BLOOD PRESSURE (ICD-796.2) Assessment Improved BP wnl now.   Problem # 2:  OTHER SPECIFIED PRURITIC CONDITIONS (ICD-698.8) Assessment: Improved D/C ear drops.   Complete Medication List: 1)  Paxil Cr 25 Mg  Xr24h-tab (Paroxetine hcl) .Marland Kitchen.. 1 tab by mouth daily 2)  Famotidine 40 Mg Tabs (Famotidine) .Marland Kitchen.. 1 tab by mouth two times a day 3)  Colace 100 Mg Caps (Docusate sodium) .Marland Kitchen.. 1 tablet by mouth two times a day 4)  Theratrum Complete Tabs (Multiple vitamins-minerals) .Marland Kitchen.. 1 tab by mouth daily 5)  Oyster Shell Calcium/vitamin D 500-200 Mg-unit Tabs (Calcium carbonate-vitamin d) .Marland Kitchen.. 1 tab by mouth two times a day 6)  Tylenol 325 Mg Tabs (Acetaminophen) .... 2 tabs by mouth q4h as needed pain/fever 7)  Proair Hfa 108 (90 Base) Mcg/act Aers (Albuterol sulfate) .... 2 puffs inhaled q4h as needed shortness of breath/wheeze 8)  Flovent Hfa 110 Mcg/act Aero (Fluticasone propionate  hfa) .Marland Kitchen.. 1 puff inhaled two times a day 9)  Clozapine 100 Mg Tabs (Clozapine) .... 1/2 tab by mouth qam, 3 tabs by mouth qpm 10)  Simvastatin 20 Mg Tabs (Simvastatin) .Marland Kitchen.. 1 tab by mouth daily for high cholesterol 11)  Theratrum Complete Tabs (Multiple vitamins-minerals) .Marland Kitchen.. 1 by mouth daily   Orders Added: 1)  [facility]- New Level 2 [99202]

## 2010-08-08 NOTE — Progress Notes (Signed)
Summary: Order Needed  Phone Note Other Incoming   Caller: [facility]or[facility] Summary of Call: Needs order for the oinment she was given to put on her buttocks.  Fax Number [phone] Initial call taken by: Clydell Hakim,  October 05, 2009 10:56 AM  Follow-up for Phone Call        I wrote order on patients visit sheet that she brought Can call or fax in order Bactroban to lesions two times a day for next 7 days thanks  Follow-up by: Pearlean Brownie MD,  October 05, 2009 11:44 AM  Additional Follow-up for Phone Call Additional follow up Details #1::        Pt misplaced form.  Faxed over this signed phone note. Additional Follow-up by: Jone Baseman CMA,  October 05, 2009 2:05 PM

## 2010-08-08 NOTE — Assessment & Plan Note (Signed)
Summary: sore throat,tcb   Vital Signs:  Patient profile:   50 year old female Height:      66.0 inches Weight:      174 pounds BMI:     28.19 BSA:     1.89 Temp:     99.9 degrees F Pulse rate:   128 / minute BP sitting:   135 / 85  Vitals Entered By: Jone Baseman CMA (October 19, 2009 2:04 PM) CC: sore throat Is Patient Diabetic? No Pain Assessment Patient in pain? yes     Location: head Intensity: 5   Primary Care Provider:  Romero Belling MD  CC:  sore throat.  History of Present Illness: Patient is here today for c/o sore throat, generalized aches, HA x 2 days.  She has a h/o schizophrenia,  interview was somewhat difficult due to her inability to focus on questions asked.  She denies fever, N/V/D.  She states her job working in the [employer] is very stressful and she attributes her symptoms to working many hours. She believes she is working too hard due to her new medication, Clozaril, prescribed by Psych. She is requesting a change in medication.   Habits & Providers  Alcohol-Tobacco-Diet     Tobacco Status: current     Tobacco Counseling: to quit use of tobacco products     Cigarette Packs/Day: 1.0  Allergies: 1)  Penicillin G Potassium (Penicillin G Potassium)  Review of Systems  The patient denies anorexia, fever, weight loss, chest pain, syncope, dyspnea on exertion, and abdominal pain.    Physical Exam  General:  Well-developed,well-nourished,in no acute distress; alert,appropriate and cooperative throughout examination Head:  Normocephalic and atraumatic without obvious abnormalities. Rosacea noted on face Ears:  External ear exam shows no significant lesions or deformities.  Otoscopic examination reveals clear canals, tympanic membranes are intact bilaterally without bulging, retraction, inflammation or discharge. Hearing is grossly normal bilaterally. Mouth:  Oral mucosa and oropharynx without lesions or exudates.  Neck:  No deformities,  masses, or tenderness noted. Lungs:  Normal respiratory effort, chest expands symmetrically. Lungs are clear to auscultation, no crackles or wheezes. Heart:  Normal rate and regular rhythm. S1 and S2 normal without gallop, murmur, click, rub or other extra sounds. Abdomen:  Bowel sounds positive,abdomen soft and non-tender without masses, organomegaly or hernias noted.   Impression & Recommendations:  Problem # 1:  SORE THROAT (ICD-462) Strep negative. Most likely viral URI. Work excuse provided. Continue supportive care. Fluids, rest, acetaminophen as needed. Her updated medication list for this problem includes:    Tylenol 325 Mg Tabs (Acetaminophen) .Marland Kitchen... 2 tabs by mouth q4h as needed pain/fever  Orders: Rapid Strep-[facility] (16109) [facility]- Est Level  3 (60454)  Problem # 2:  SCHIZOPHRENIA (ICD-295.90)  Recommend patient follow up with Psych if she wants her meds changed.  Orders: [facility]- Est Level  3 (09811)  Complete Medication List: 1)  Paxil Cr 25 Mg Xr24h-tab (Paroxetine hcl) .Marland Kitchen.. 1 tab by mouth daily 2)  Famotidine 40 Mg Tabs (Famotidine) .Marland Kitchen.. 1 tab by mouth two times a day 3)  Colace 100 Mg Caps (Docusate sodium) .Marland Kitchen.. 1 tablet by mouth two times a day 4)  Theratrum Complete Tabs (Multiple vitamins-minerals) .Marland Kitchen.. 1 tab by mouth daily 5)  Oyster Shell Calcium/vitamin D 500-200 Mg-unit Tabs (Calcium carbonate-vitamin d) .Marland Kitchen.. 1 tab by mouth two times a day 6)  Tylenol 325 Mg Tabs (Acetaminophen) .... 2 tabs by mouth q4h as needed pain/fever 7)  Proair Hfa  108 (90 Base) Mcg/act Aers (Albuterol sulfate) .... 2 puffs inhaled q4h as needed shortness of breath/wheeze 8)  Flovent Hfa 110 Mcg/act Aero (Fluticasone propionate  hfa) .Marland Kitchen.. 1 puff inhaled two times a day 9)  Clozapine 100 Mg Tabs (Clozapine) .... 1/2 tab by mouth qam, 3 tabs by mouth qpm 10)  Fluconazole 150 Mg Tabs (Fluconazole) .... Take 1 tablet now if no relief may take other tablet 72 hours (3 days) later 11)  Simvastatin 20  Mg Tabs (Simvastatin) .Marland Kitchen.. 1 tab by mouth daily for high cholesterol 12)  Bactroban 2 % Oint (Mupirocin) .... Use two times a day on your sores 1tube  Laboratory Results  Date/Time Received: October 19, 2009 2:04 PM  Date/Time Reported: October 19, 2009 2:24 PM   Other Tests  Rapid Strep: negative Comments: ...............test performed by......Marland KitchenBonnie A. [place], MLS (ASCP)cm

## 2010-08-08 NOTE — Assessment & Plan Note (Signed)
Summary: sorethroat/[facility]   Vital Signs:  Patient profile:   50 year old female Height:      66.0 inches Weight:      180 pounds BMI:     29.16 Temp:     98.3 degrees F oral Pulse rate:   120 / minute BP sitting:   163 / 83  (left arm) Cuff size:   regular  Vitals Entered By: Garen Grams [name] (June 19, 2010 10:58 AM) CC: runny nose, congestion, cough, fatigue x days Is Patient Diabetic? No Pain Assessment Patient in pain? no        Primary Care Provider:  Dessa Phi MD  CC:  runny nose, congestion, cough, and fatigue x days.  History of Present Illness: 1. Cold symptoms:  pt has had cold like symptoms for the past 3 days.  Symptoms include runny nose, congestion, cough, nasal congestion.  She thinks that it is just a cold but was told by the manager [facility] that she had to be seen by a doctor. ROS: denies fevers, headaches, chest pain, shortness of breath, abdominal pain, n/v/d  2. Itchy ear:  She has had an itchy right ear for the past couple of weeks. ROS: denies ear drainage, loss of hearing, problems with balance.  3. High blood pressure: Her blood pressure is usually normal.  She doesn't check her blood pressure at home regularly.  Didn't take any OTC cough syrup.  Habits & Providers  Alcohol-Tobacco-Diet     Tobacco Status: current     Tobacco Counseling: to quit use of tobacco products     Cigarette Packs/Day: 1.0  Current Medications (verified): 1)  Paxil Cr 25 Mg  Xr24h-Tab (Paroxetine Hcl) .Marland Kitchen.. 1 Tab By Mouth Daily 2)  Famotidine 40 Mg Tabs (Famotidine) .Marland Kitchen.. 1 Tab By Mouth Two Times A Day 3)  Colace 100 Mg  Caps (Docusate Sodium) .Marland Kitchen.. 1 Tablet By Mouth Two Times A Day 4)  Theratrum Complete   Tabs (Multiple Vitamins-Minerals) .Marland Kitchen.. 1 Tab By Mouth Daily 5)  Oyster Shell Calcium/vitamin D 500-200 Mg-Unit  Tabs (Calcium Carbonate-Vitamin D) .Marland Kitchen.. 1 Tab By Mouth Two Times A Day 6)  Tylenol 325 Mg  Tabs (Acetaminophen) .... 2 Tabs By Mouth Q4h  As Needed Pain/fever 7)  Proair Hfa 108 (90 Base) Mcg/act  Aers (Albuterol Sulfate) .... 2 Puffs Inhaled Q4h As Needed Shortness of Breath/wheeze 8)  Flovent Hfa 110 Mcg/act  Aero (Fluticasone Propionate  Hfa) .Marland Kitchen.. 1 Puff Inhaled Two Times A Day 9)  Clozapine 100 Mg  Tabs (Clozapine) .... 1/2 Tab By Mouth Qam, 3 Tabs By Mouth Qpm 10)  Simvastatin 20 Mg Tabs (Simvastatin) .Marland Kitchen.. 1 Tab By Mouth Daily For High Cholesterol 11)  Theratrum Complete  Tabs (Multiple Vitamins-Minerals) .Marland Kitchen.. 1 By Mouth Daily 12)  Hydrocortisone-Acetic Acid 1-2 % Soln (Hydrocortisone-Acetic Acid) .... 3-4 Drops in Right Ear Twice A Day For Itching  Allergies: 1)  Penicillin G Potassium (Penicillin G Potassium)  Past History:  Past Medical History: Reviewed history from 05/04/2007 and no changes required. 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 tobacco use - 1 ppd for 30 years; -EtOH/ellicit drugs; lives in [facility] (4 females, ); works at [employer] at [employer] (works about 25 hours per week); has 1 child who was adopted; brother has POA(lives in  [place]), swims every Saturday at [facility]; dropped out of school in 12th grade yet ?got GED?, not currently sexually active; 2 siblings in [place]; 1 sister in [place]; both parents deceased; heterosexual and homosexual feelings, swims at [facility], smokes 1 pack per day.  Smoking Status:  current  Physical Exam  General:  Vitals reviewed.  hypertensive. overweight, mildly anxious, NAD  Head:  normocephalic and atraumatic.   Eyes:  vision grossly intact.  normal conjunctiva Ears:  R ear: external ear canal with minimal scaling.  TM normal L ear normal Nose:  sinus congestion.  clear nasal discharge.  No sinus pain or pressure Mouth:  OP pink and moist.  No  exudate Neck:  supple, full ROM, and no masses.   Lungs:  CTAB w/o wheeze or crackles  Heart:  tachycardic, regular rhythm, no murmurs Abdomen:  Bowel sounds positive,abdomen soft and non-tender without masses, organomegaly or hernias noted. Extremities:  no LE edema  Psych:  Moderately anxious   Impression & Recommendations:  Problem # 1:  VIRAL URI (ICD-465.9) Assessment New  No red flags.  Well appearing.  Supportive care only. Her updated medication list for this problem includes:    Tylenol 325 Mg Tabs (Acetaminophen) .Marland Kitchen... 2 tabs by mouth q4h as needed pain/fever  Orders: [facility]- Est  Level 4 (04540)  Problem # 2:  OTHER SPECIFIED PRURITIC CONDITIONS (ICD-698.8) Assessment: New  Right ear itching.  Benign dermatitis.  HC drops as needed.  Orders: [facility]- Est  Level 4 (98119)  Problem # 3:  ELEVATED BLOOD PRESSURE (ICD-796.2) Assessment: New  Advised patient to come back in 1-2 weeks to recheck blood pressure.  She would likely benefit from a BB because of the tachycardia.  Will defer to PCP.  Orders: [facility]- Est  Level 4 (14782) Assessment: New  Complete Medication List: 1)  Paxil Cr 25 Mg Xr24h-tab (Paroxetine hcl) .Marland Kitchen.. 1 tab by mouth daily 2)  Famotidine 40 Mg Tabs (Famotidine) .Marland Kitchen.. 1 tab by mouth two times a day 3)  Colace 100 Mg Caps (Docusate sodium) .Marland Kitchen.. 1 tablet by mouth two times a day 4)  Theratrum Complete Tabs (Multiple vitamins-minerals) .Marland Kitchen.. 1 tab by mouth daily 5)  Oyster Shell Calcium/vitamin D 500-200 Mg-unit Tabs (Calcium carbonate-vitamin d) .Marland Kitchen.. 1 tab by mouth two times a day 6)  Tylenol 325 Mg Tabs (Acetaminophen) .... 2 tabs by mouth q4h as needed pain/fever 7)  Proair Hfa 108 (90 Base) Mcg/act Aers (Albuterol sulfate) .... 2 puffs inhaled q4h as needed shortness of breath/wheeze 8)  Flovent Hfa 110 Mcg/act Aero (Fluticasone propionate  hfa) .Marland Kitchen.. 1 puff inhaled two times a day 9)  Clozapine 100 Mg Tabs (Clozapine) .... 1/2 tab by mouth qam, 3 tabs by  mouth qpm 10)  Simvastatin 20 Mg Tabs (Simvastatin) .Marland Kitchen.. 1 tab by mouth daily for high cholesterol 11)  Theratrum Complete Tabs (Multiple vitamins-minerals) .Marland Kitchen.. 1 by mouth daily 12)  Hydrocortisone-acetic Acid 1-2 % Soln (Hydrocortisone-acetic acid) .... 3-4 drops in right ear twice a day for itching  Patient Instructions: 1)  You have a viral infection that will resolve on its own over time. 2)  Drink plenty of fluids and stay hydrated! 3)  Antibiotics are not helpful for viral infections. 4)  Wash your hands frequently. 5)  Symptoms usually last 3-7 days but can last up to 2-3 weeks. 6)  Call if you are not improving by 7-10 days. 7)  I have sent in a prescription for some drops to help you with  the itchy ear 8)  I would also like for you to come back in 1-2 weeks to have your regular doctor recheck your blood pressure 9)    Prescriptions: HYDROCORTISONE-ACETIC ACID 1-2 % SOLN (HYDROCORTISONE-ACETIC ACID) 3-4 drops in right ear twice a day for itching  #1 x 1   Entered and Authorized by:   Angelena Sole MD   Signed by:   Angelena Sole MD on 06/19/2010   Method used:   Electronically to        [facility]* (retail)       [address]       [place], [place]  [id number]       Ph: [phone]       Fax: [phone]   RxID:   [id number]    Orders Added: 1)  [facility]- Est  Level 4 [40347]

## 2010-08-08 NOTE — Assessment & Plan Note (Signed)
Summary: cold,tcb   Vital Signs:  Patient profile:   50 year old female Height:      66.0 inches Weight:      171.0 pounds BMI:     27.70 Temp:     98.5 degrees F oral Pulse rate:   97 / minute BP sitting:   124 / 91  (right arm) Cuff size:   regular  Vitals Entered By: Garen Grams [name] (August 01, 2009 3:18 PM) CC: cough and congestion x 1 week Is Patient Diabetic? No Pain Assessment Patient in pain? no        Primary Care Provider:  Romero Belling MD  CC:  cough and congestion x 1 week.  History of Present Illness: 1. cold Reports runny nose, dry cough, muscle aches for about one week. Maybe one sick contact. Nose is running clear.   ROS: no nausea, vomting, diarrhea; no problems breathing; some mild headache; no fever, no sore throat. No ear pain  Habits & Providers  Alcohol-Tobacco-Diet     Tobacco Status: current     Cigarette Packs/Day: 1.0  Current Medications (verified): 1)  Paxil Cr 25 Mg  Xr24h-Tab (Paroxetine Hcl) .Marland Kitchen.. 1 Tab By Mouth Daily 2)  Famotidine 40 Mg Tabs (Famotidine) .Marland Kitchen.. 1 Tab By Mouth Two Times A Day 3)  Colace 100 Mg  Caps (Docusate Sodium) .Marland Kitchen.. 1 Tablet By Mouth Two Times A Day 4)  Theratrum Complete   Tabs (Multiple Vitamins-Minerals) .Marland Kitchen.. 1 Tab By Mouth Daily 5)  Oyster Shell Calcium/vitamin D 500-200 Mg-Unit  Tabs (Calcium Carbonate-Vitamin D) .Marland Kitchen.. 1 Tab By Mouth Two Times A Day 6)  Tylenol 325 Mg  Tabs (Acetaminophen) .... 2 Tabs By Mouth Q4h As Needed Pain/fever 7)  Proair Hfa 108 (90 Base) Mcg/act  Aers (Albuterol Sulfate) .... 2 Puffs Inhaled Q4h As Needed Shortness of Breath/wheeze 8)  Flovent Hfa 110 Mcg/act  Aero (Fluticasone Propionate  Hfa) .Marland Kitchen.. 1 Puff Inhaled Two Times A Day 9)  Clozapine 100 Mg  Tabs (Clozapine) .... 1/2 Tab By Mouth Qam, 3 Tabs By Mouth Qpm 10)  Fluconazole 150 Mg Tabs (Fluconazole) .... Take 1 Tablet Now If No Relief May Take Other Tablet 72 Hours (3 Days) Later 11)  Simvastatin 20 Mg Tabs (Simvastatin) .Marland Kitchen..  1 Tab By Mouth Daily For High Cholesterol 12)  Maxifed 60-400 Mg Tabs (Pseudoephedrine-Guaifenesin) .... Take One By Mouth Every 6 Hours As Needed  Allergies (verified): 1)  Penicillin G Potassium (Penicillin G Potassium)  Social History: tobacco use - 1 ppd for 30 years; -[place]/ellicit drugs; lives in [facility] (4 females, ); works at [employer] at [employer] (works about 25 hours per week); has 1 child who was adopted; brother has POA(lives in [place]), swims every Saturday at [facility]; dropped out of school in 12th grade yet ?got GED?, not currently sexually active; 2 siblings in [place]; 1 sister in [place]; both parents deceased; heterosexual and homosexual feelings, swims at [facility], smokes 1 pack per day.  Packs/Day:  1.0  Physical Exam  Additional Exam:  General:  Vital signs reviewed -- mild diastolic HTN Alert, appropriate; well-dressed and well-nourished HEENT: OP pink and moist; no erythema or exudate; TMs clear and gray bilaterally Neck: mild anterior chain cervical LAD bilaterally. Lungs:  work of breathing unlabored, clear to auscultation bilaterally; no wheezes, rales, or ronchi; good air movement throughout Heart:  regular rate and rhythm, no murmurs; normal s1/s2 Extremities:  no cyanosis, clubbing, or edema Neurologic:  alert and  oriented. speech normal.    Impression & Recommendations:  Problem # 1:  UPPER RESPIRATORY INFECTION, ACUTE (ICD-465.9) Assessment New  mild, viral uri. conservative measures. Gave work note for today.  Her updated medication list for this problem includes:    Tylenol 325 Mg Tabs (Acetaminophen) .Marland Kitchen... 2 tabs by mouth q4h as needed pain/fever    Maxifed 60-400 Mg Tabs (Pseudoephedrine-guaifenesin) .Marland Kitchen... Take one by mouth every 6 hours as needed  Orders: [facility]- Est Level  3 (16109)  Problem # 2:  SCHIZOPHRENIA (ICD-295.90) Assessment: Comment Only FL2 form signed today.  Complete Medication List: 1)  Paxil  Cr 25 Mg Xr24h-tab (Paroxetine hcl) .Marland Kitchen.. 1 tab by mouth daily 2)  Famotidine 40 Mg Tabs (Famotidine) .Marland Kitchen.. 1 tab by mouth two times a day 3)  Colace 100 Mg Caps (Docusate sodium) .Marland Kitchen.. 1 tablet by mouth two times a day 4)  Theratrum Complete Tabs (Multiple vitamins-minerals) .Marland Kitchen.. 1 tab by mouth daily 5)  Oyster Shell Calcium/vitamin D 500-200 Mg-unit Tabs (Calcium carbonate-vitamin d) .Marland Kitchen.. 1 tab by mouth two times a day 6)  Tylenol 325 Mg Tabs (Acetaminophen) .... 2 tabs by mouth q4h as needed pain/fever 7)  Proair Hfa 108 (90 Base) Mcg/act Aers (Albuterol sulfate) .... 2 puffs inhaled q4h as needed shortness of breath/wheeze 8)  Flovent Hfa 110 Mcg/act Aero (Fluticasone propionate  hfa) .Marland Kitchen.. 1 puff inhaled two times a day 9)  Clozapine 100 Mg Tabs (Clozapine) .... 1/2 tab by mouth qam, 3 tabs by mouth qpm 10)  Fluconazole 150 Mg Tabs (Fluconazole) .... Take 1 tablet now if no relief may take other tablet 72 hours (3 days) later 11)  Simvastatin 20 Mg Tabs (Simvastatin) .Marland Kitchen.. 1 tab by mouth daily for high cholesterol 12)  Maxifed 60-400 Mg Tabs (Pseudoephedrine-guaifenesin) .... Take one by mouth every 6 hours as needed  Patient Instructions: 1)  This is a virus. It should run its course in about 10-14 days. 2)  Take pseudoephedrine to help with congestion and headache.  3)  Make sure you're drinking plenty of fluids. Your urine should run clear. 4)  Call if you feel worse suddenly or if not some better in the next 7 days.  5)  If you notice your heart racing or panic attacks getting worse, stop the medicine. Prescriptions: MAXIFED 60-400 MG TABS (PSEUDOEPHEDRINE-GUAIFENESIN) take one by mouth every 6 hours as needed  #30 x 0   Entered and Authorized by:   Myrtie Soman  MD   Signed by:   Myrtie Soman  MD on 08/01/2009   Method used:   Electronically to        [facility]* (retail)       [address]       [place], [place]  [id number]       Ph: [phone]       Fax:  [phone]   RxID:   [id number]   Appended Document: cold,tcb received notice from [facility] asking if they could dispense Maxifed 40/400mg  instead of 60/400mg  . paged Dr. Rexene Alberts and he advises this will be fine. pharmacy notified.

## 2010-08-08 NOTE — Progress Notes (Signed)
Summary: TB Test Req  Phone Note Call from Patient Call back at Home Phone [phone]   Caller: Patient Summary of Call: Needs copy of last 2 tb test faxed to home where she is staying due to inspection by the state.  Fax number is same as home number. Initial call taken by: Clydell Hakim,  February 12, 2010 11:44 AM  Follow-up for Phone Call        called back to number provided and left message for patient to call back. unable to find any PPD that she has had since going on to EMR. Follow-up by: Theresia Lo RN,  February 12, 2010 3:06 PM  Additional Follow-up for Phone Call Additional follow up Details #1::        patient notified. Additional Follow-up by: Theresia Lo RN,  February 13, 2010 9:52 AM

## 2010-09-09 ENCOUNTER — Other Ambulatory Visit: Payer: Self-pay | Admitting: *Deleted

## 2010-09-09 DIAGNOSIS — E785 Hyperlipidemia, unspecified: Secondary | ICD-10-CM

## 2010-09-09 MED ORDER — SIMVASTATIN 20 MG PO TABS: 20.0000 mg | ORAL_TABLET | Freq: Every day | ORAL | Status: DC

## 2010-09-09 MED ORDER — DOCUSATE SODIUM 100 MG PO CAPS: 100.0000 mg | ORAL_CAPSULE | Freq: Two times a day (BID) | ORAL | Status: DC

## 2010-09-23 ENCOUNTER — Other Ambulatory Visit: Payer: Self-pay | Admitting: *Deleted

## 2010-09-23 MED ORDER — FLUTICASONE PROPIONATE HFA 110 MCG/ACT IN AERO: 1.0000 | INHALATION_SPRAY | Freq: Two times a day (BID) | RESPIRATORY_TRACT | Status: DC

## 2010-09-23 NOTE — Telephone Encounter (Signed)
Care Facility where patient lives  notified that patient needs to schedule appointment. Rx sent in for flovent for one device.

## 2010-10-07 ENCOUNTER — Ambulatory Visit (INDEPENDENT_AMBULATORY_CARE_PROVIDER_SITE_OTHER): Payer: Medicare Other | Admitting: Family Medicine

## 2010-10-07 ENCOUNTER — Encounter: Payer: Self-pay | Admitting: Family Medicine

## 2010-10-07 VITALS — BP 138/72 | HR 134 | Temp 99.5°F | Wt 186.2 lb

## 2010-10-07 DIAGNOSIS — H9210 Otorrhea, unspecified ear: Secondary | ICD-10-CM

## 2010-10-07 DIAGNOSIS — Z111 Encounter for screening for respiratory tuberculosis: Secondary | ICD-10-CM

## 2010-10-07 DIAGNOSIS — L0390 Cellulitis, unspecified: Secondary | ICD-10-CM

## 2010-10-07 MED ORDER — CLINDAMYCIN HCL 300 MG PO CAPS: 300.0000 mg | ORAL_CAPSULE | Freq: Three times a day (TID) | ORAL | Status: AC

## 2010-10-07 MED ORDER — HYDROCODONE-ACETAMINOPHEN 5-500 MG PO TABS: 1.0000 | ORAL_TABLET | ORAL | Status: AC | PRN

## 2010-10-07 NOTE — Patient Instructions (Signed)
Ms. Wahlquist,  Keep your arm clean and dressed with clean hands you can express some of the drainage, clean with alcohol swab, apply neosporin and dress. Take your antibiotics for 10 day course. If the pain/redness worsens please call the clinic, especially if the redness begins to spread up your arm.

## 2010-10-08 ENCOUNTER — Telehealth: Payer: Self-pay | Admitting: Family Medicine

## 2010-10-08 NOTE — Telephone Encounter (Signed)
[name] from the group home calling about documents that were left 3 wks ago for completion.  Need to have them back completed asap because they have to be turned in to medicare in order for [name] to continue residency there.

## 2010-10-09 ENCOUNTER — Encounter: Payer: Self-pay | Admitting: Family Medicine

## 2010-10-09 ENCOUNTER — Ambulatory Visit (INDEPENDENT_AMBULATORY_CARE_PROVIDER_SITE_OTHER): Payer: Medicare Other | Admitting: *Deleted

## 2010-10-09 DIAGNOSIS — Z111 Encounter for screening for respiratory tuberculosis: Secondary | ICD-10-CM

## 2010-10-09 DIAGNOSIS — IMO0001 Reserved for inherently not codable concepts without codable children: Secondary | ICD-10-CM

## 2010-10-09 NOTE — Progress Notes (Signed)
  Subjective:    Patient ID: Jean Williams, female    DOB: 1960-09-30, 50 y.o.   MRN: [id number]  HPI  Pt here for boil on her R forearm and to get a Tb skin test required by [facility] group home [facility]  1. Boil- First noticed 3 days ago. Pt is prone to boils and folliculitis in the past. The boil had a black head over it but that popped yesterday. Some clear fluid drained. The area is painful 6/10. The patient has not tried anything for the pain or for the wound.   2. TB skin test- a required test for patients family care home. The patient has no symptoms of Tb. She denies cough, night sweats. Weight loss. Pt has no known exposure to Tb.   3. B/l ear draining- pt feels that her ears are draining and itching. She denies ear pain. She denies loss of hearing.   Review of Systems  Constitutional: Negative for fever, chills, diaphoresis, activity change, appetite change, fatigue and unexpected weight change.  HENT: Positive for ear discharge. Negative for hearing loss, ear pain, rhinorrhea and tinnitus.        Objective:   Physical Exam  Constitutional: She appears well-developed and well-nourished. She is cooperative.  HENT:  Right Ear: Hearing, tympanic membrane, external ear and ear canal normal. No drainage, swelling or tenderness. Tympanic membrane is not bulging. No middle ear effusion. No hemotympanum. No decreased hearing is noted.  Left Ear: Hearing, tympanic membrane, external ear and ear canal normal.  Lymphadenopathy:    She has no axillary adenopathy.  Skin: No erythema.             Assessment & Plan:

## 2010-10-10 LAB — WOUND CULTURE

## 2010-11-05 ENCOUNTER — Other Ambulatory Visit (INDEPENDENT_AMBULATORY_CARE_PROVIDER_SITE_OTHER): Payer: Self-pay | Admitting: *Deleted

## 2010-11-05 DIAGNOSIS — K5900 Constipation, unspecified: Secondary | ICD-10-CM

## 2010-11-05 MED ORDER — DOCUSATE SODIUM 100 MG PO CAPS: 100.0000 mg | ORAL_CAPSULE | Freq: Two times a day (BID) | ORAL | Status: DC

## 2010-11-07 ENCOUNTER — Ambulatory Visit (INDEPENDENT_AMBULATORY_CARE_PROVIDER_SITE_OTHER): Payer: Medicare Other | Admitting: Family Medicine

## 2010-11-07 ENCOUNTER — Encounter: Payer: Self-pay | Admitting: Family Medicine

## 2010-11-07 VITALS — BP 129/90 | HR 128 | Temp 98.1°F | Ht 66.0 in | Wt 181.7 lb

## 2010-11-07 DIAGNOSIS — H6093 Unspecified otitis externa, bilateral: Secondary | ICD-10-CM | POA: Insufficient documentation

## 2010-11-07 DIAGNOSIS — H60399 Other infective otitis externa, unspecified ear: Secondary | ICD-10-CM

## 2010-11-07 MED ORDER — HYDROCORTISONE-ACETIC ACID 1-2 % OT SOLN: 4.0000 [drp] | Freq: Four times a day (QID) | OTIC | Status: AC

## 2010-11-07 NOTE — Assessment & Plan Note (Signed)
1. Otitis externa- mild. Will have ears flushed in office. Then treat with b/l acetic acid/hydrocortisone ear drops x 7 days.

## 2010-11-07 NOTE — Patient Instructions (Signed)
[name],  Thank you for coming in today. Try using the new ear drops in both ears 4 time each day for the next 7 days for what appears to be a mild otitis externa (outer ear infection). Come back if the symptoms do not improve at the end of the course.   Call for an appointment to specifically remove your wart once your medicaid is straightened out.

## 2010-11-07 NOTE — Progress Notes (Signed)
  Subjective:    Patient ID: Jean Williams, female    DOB: 04-Feb-1961, 50 y.o.   MRN: [id number]  HPI Here to f/u ear drainage and irritation. Also recent R arm cellulitis.   1. Ear drainage and irritation- ongoing problem for about 2 mos now. Treated with ciprodex ear drops to R ear. With mild temporary improvement.  Feels like there is water in her ears and her external auditory canal are very itchy. No ear pain. No decrease in hearing.   2. R arm cellulitis- pt completed course of treatment. Cultures reviewed. MRSA. No pain or draining for previously draining site.     Review of Systems  HENT: Positive for ear discharge. Negative for hearing loss, ear pain, nosebleeds, congestion, sore throat, facial swelling, rhinorrhea, sneezing, drooling, mouth sores, trouble swallowing, neck pain, neck stiffness, dental problem, voice change, postnasal drip, sinus pressure and tinnitus.   Skin: Negative for color change, pallor, rash and wound.       Objective:   Physical Exam  HENT:  Right Ear: Hearing and external ear normal. No drainage or swelling. No foreign bodies. Tympanic membrane is not injected, not perforated and not erythematous.  Left Ear: Hearing and external ear normal. No drainage, swelling or tenderness. No foreign bodies. Tympanic membrane is not injected, not perforated and not erythematous.  Ears:  Skin:             Assessment & Plan:  1. Otitis externa- mild. Will have ears flushed in office. Then treat with b/l acetic acid/hydrocortisone ear drops x 7 days. 2. R arm cellulitis resolved.

## 2010-11-19 ENCOUNTER — Other Ambulatory Visit (INDEPENDENT_AMBULATORY_CARE_PROVIDER_SITE_OTHER): Payer: Self-pay | Admitting: Family Medicine

## 2010-11-19 DIAGNOSIS — J45909 Unspecified asthma, uncomplicated: Secondary | ICD-10-CM

## 2010-11-27 ENCOUNTER — Other Ambulatory Visit: Payer: Self-pay | Admitting: Family Medicine

## 2010-11-27 DIAGNOSIS — Z1231 Encounter for screening mammogram for malignant neoplasm of breast: Secondary | ICD-10-CM

## 2010-12-09 ENCOUNTER — Telehealth: Payer: Self-pay | Admitting: Family Medicine

## 2010-12-09 ENCOUNTER — Encounter: Payer: Self-pay | Admitting: Family Medicine

## 2010-12-09 ENCOUNTER — Ambulatory Visit (INDEPENDENT_AMBULATORY_CARE_PROVIDER_SITE_OTHER): Payer: Self-pay | Admitting: Family Medicine

## 2010-12-09 VITALS — BP 128/88 | HR 125 | Temp 98.4°F | Wt 183.0 lb

## 2010-12-09 DIAGNOSIS — IMO0002 Reserved for concepts with insufficient information to code with codable children: Secondary | ICD-10-CM

## 2010-12-09 DIAGNOSIS — L02412 Cutaneous abscess of left axilla: Secondary | ICD-10-CM

## 2010-12-09 DIAGNOSIS — L738 Other specified follicular disorders: Secondary | ICD-10-CM

## 2010-12-09 MED ORDER — DOXYCYCLINE HYCLATE 100 MG PO CAPS: 100.0000 mg | ORAL_CAPSULE | Freq: Two times a day (BID) | ORAL | Status: AC

## 2010-12-09 NOTE — Patient Instructions (Signed)
It was nice to meet you.  I want you to take 10 days of this antibiotic to completely get rid of the infection.  Please keep the dressing I put on under your armpit on until tomorrow.  Then you can keep it open to the air.  Please contact the office if it starts draining more white discharge or the skin gets more red or swollen.

## 2010-12-09 NOTE — Telephone Encounter (Signed)
Pt has an appt this AM and they are calling to tell us the new pharmacy is [facility] - fax [phone]

## 2010-12-09 NOTE — Telephone Encounter (Signed)
noted 

## 2010-12-09 NOTE — Progress Notes (Signed)
HPI: Patient presents with cc of boil under her left axilla.  She says she had a boil on her right arm about a month ago.  She says this current one started about two weeks ago.  She says it is painful and red, and has drained a little bit of white fluid.  She also has a spot in her right axilla but it is not as big.   ROS: Patient denies fevers/chills, nausea vomiting, other areas of skin infection or pain.   BP 128/88  Pulse 125  Temp(Src) 98.4 F (36.9 C) (Oral)  Wt 183 lb (83.008 kg) General appearance: alert, cooperative and no distress Skin: Skin color, texture, turgor normal. No rashes or lesions Axilla: Left: area of erythema with white pustule in center, fluctuant area about 1 cm, indurated area about 2 cm x 4 cm.    Right: Small 1 cm area or erythema, no induration, no fluctuance.   Incision and Drainage Procedure Note  Pre-operative Diagnosis: Left axillary Abscess  Post-operative Diagnosis: same  Indications: Drain abscess  Anesthesia: 1% plain lidocaine  Procedure Details  The procedure, risks and complications have been discussed in detail (including, but not limited to airway compromise, infection, bleeding) with the patient, and the patient has signed consent to the procedure.  The skin was sterilely prepped and draped over the affected area in the usual fashion. After adequate local anesthesia, I&D with a #15 blade was performed on the Left axilla.  Purulent drainage: present Wound irrigated with sterile saline.   Dressing placed after hemostasis.  Blood loss minimal.   A/P: 50 year old female with Folliculitis of axilla, with superinfection and left axillary abscess s/p I & D: \ 1) Pt instructed to return if symptoms worsen or do not get better. 2) Rx Doxycycline x 10 days.

## 2010-12-11 ENCOUNTER — Ambulatory Visit (HOSPITAL_COMMUNITY): Admission: RE | Admit: 2010-12-11 | Payer: Medicare Other | Source: Ambulatory Visit

## 2011-01-01 ENCOUNTER — Encounter: Payer: Self-pay | Admitting: Family Medicine

## 2011-01-01 ENCOUNTER — Ambulatory Visit (INDEPENDENT_AMBULATORY_CARE_PROVIDER_SITE_OTHER): Payer: Self-pay | Admitting: Family Medicine

## 2011-01-01 VITALS — BP 141/91 | HR 136 | Temp 98.7°F | Wt 183.0 lb

## 2011-01-01 DIAGNOSIS — L03031 Cellulitis of right toe: Secondary | ICD-10-CM

## 2011-01-01 DIAGNOSIS — L02619 Cutaneous abscess of unspecified foot: Secondary | ICD-10-CM

## 2011-01-01 MED ORDER — DOXYCYCLINE HYCLATE 100 MG PO TABS: 100.0000 mg | ORAL_TABLET | Freq: Two times a day (BID) | ORAL | Status: AC

## 2011-01-01 NOTE — Patient Instructions (Signed)
It was nice to see you today!  Please follow up in 1 week for a recheck of that right foot.  Please call if any of the Red Flags that we discussed develop.

## 2011-01-02 ENCOUNTER — Encounter: Payer: Self-pay | Admitting: Family Medicine

## 2011-01-02 NOTE — Assessment & Plan Note (Signed)
Rx Doxycycline to cover infection. Patient not great historian, would benefit from xray. Order placed. Will follow.

## 2011-01-02 NOTE — Progress Notes (Signed)
  Subjective:    Patient ID: Jean Williams, female    DOB: 1961/01/27, 50 y.o.   MRN: [id number]  HPI  1. Ulcer: Right great toe, x one week, after wearing sandals that rubbed skin. No fever/chills, N/V/D, LE pain or edema, CP, SOB. Toe painful, but able to move. Never had before.  Review of Systems SEE HPI.    Objective:   Physical Exam  Vitals reviewed. Constitutional: She appears well-developed and well-nourished.  Cardiovascular: Normal rate, regular rhythm, normal heart sounds and intact distal pulses.   Pulmonary/Chest: Effort normal and breath sounds normal.  Musculoskeletal: Normal range of motion. She exhibits no tenderness.       Trace edema right foot. Good DP.  Skin:       Right great toe with 1 cm ulceration, draining serous fluid, no pus, whole toe erythematous with no extension to foot.      Assessment & Plan:

## 2011-01-07 ENCOUNTER — Ambulatory Visit
Admission: RE | Admit: 2011-01-07 | Discharge: 2011-01-07 | Disposition: A | Discharge status: Home / Self Care | Payer: Medicare Other | Source: Ambulatory Visit | Attending: Family Medicine | Admitting: Family Medicine

## 2011-01-07 DIAGNOSIS — L03031 Cellulitis of right toe: Secondary | ICD-10-CM

## 2011-01-07 IMAGING — CR DG FOOT 2V*R*
2 series · 2 of 2 positions shown · non-contrast
Comparison: None.

CLINICAL DATA: Erythematous area on the right great toe

RIGHT FOOT - 2 VIEW

[view not recorded (1 of 2)]
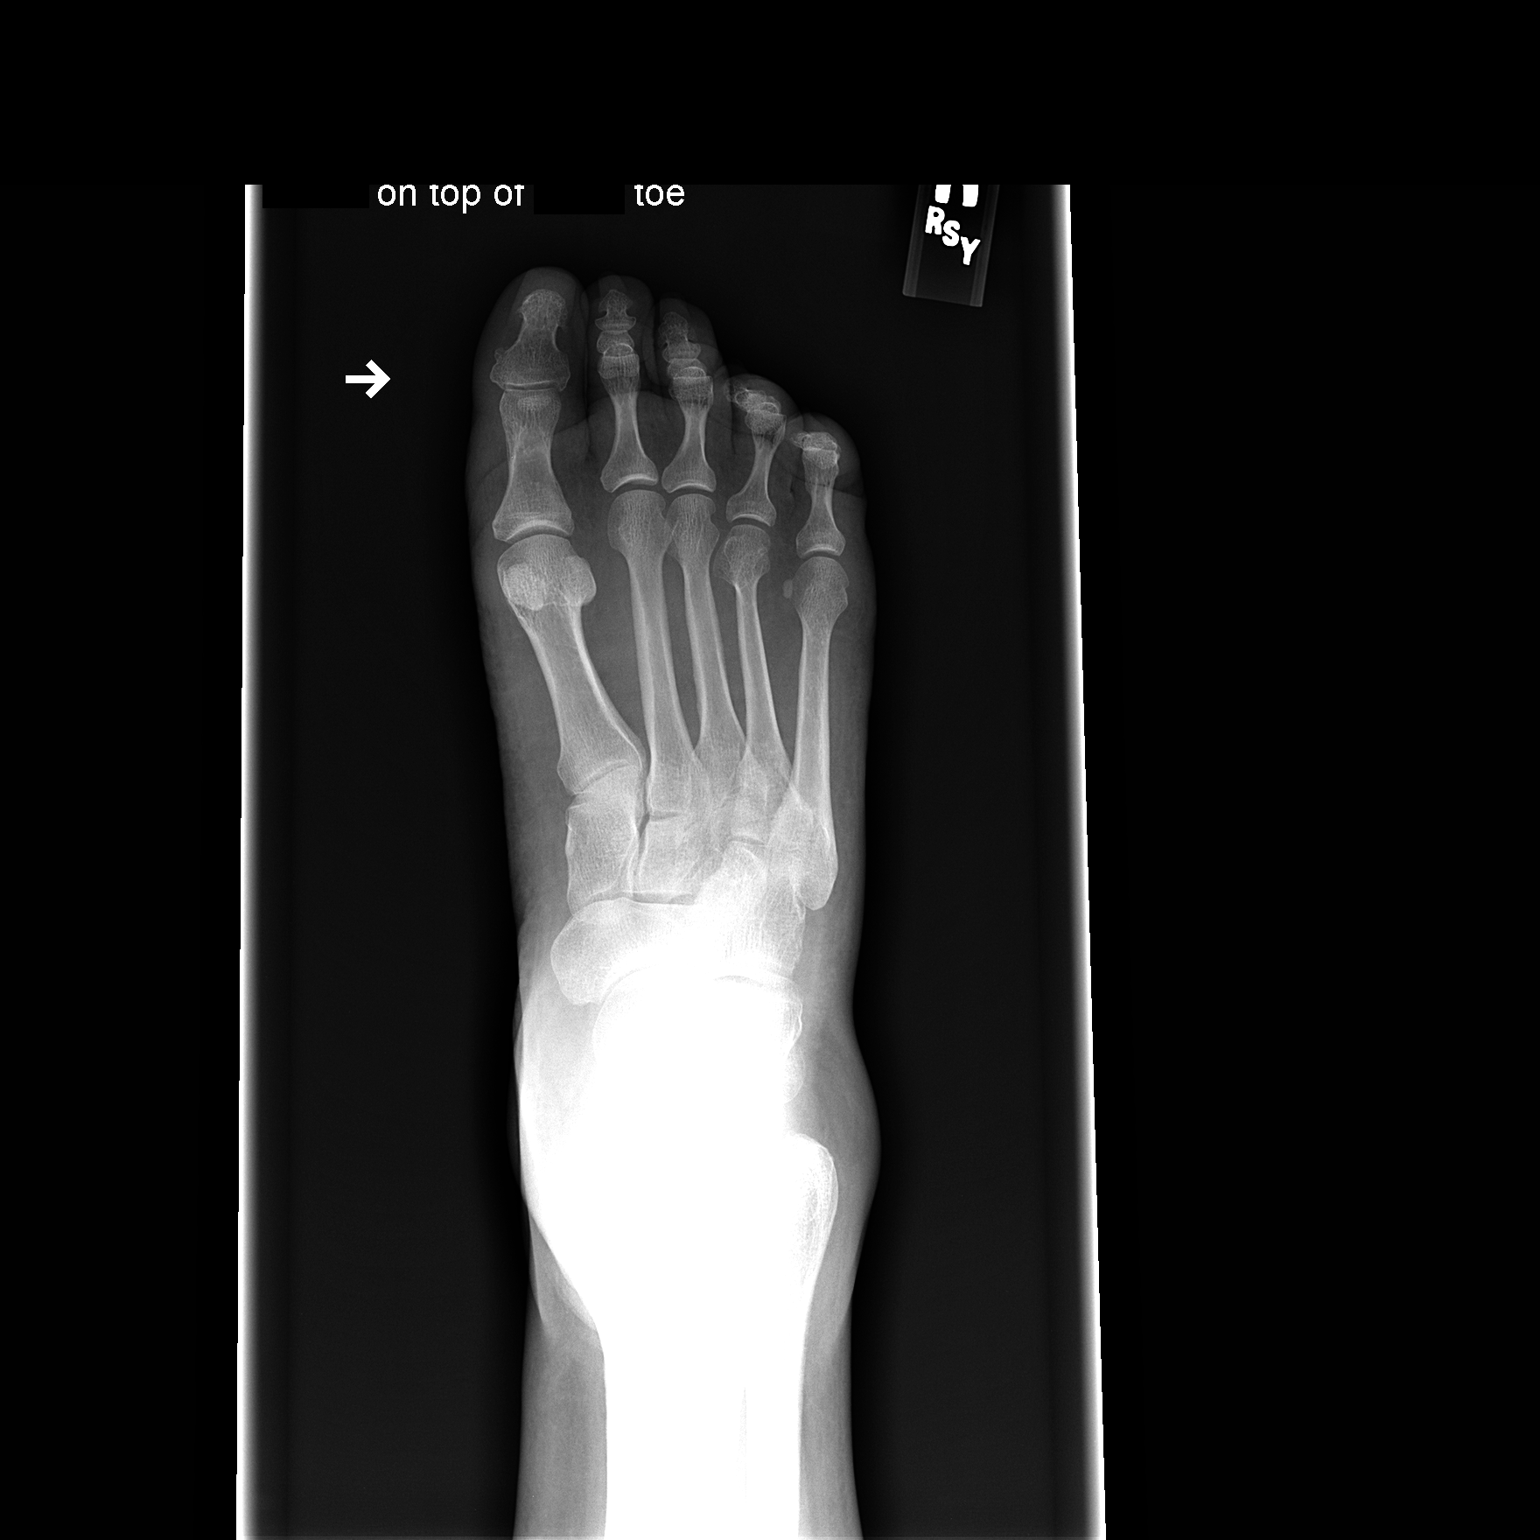

[view not recorded (2 of 2)]
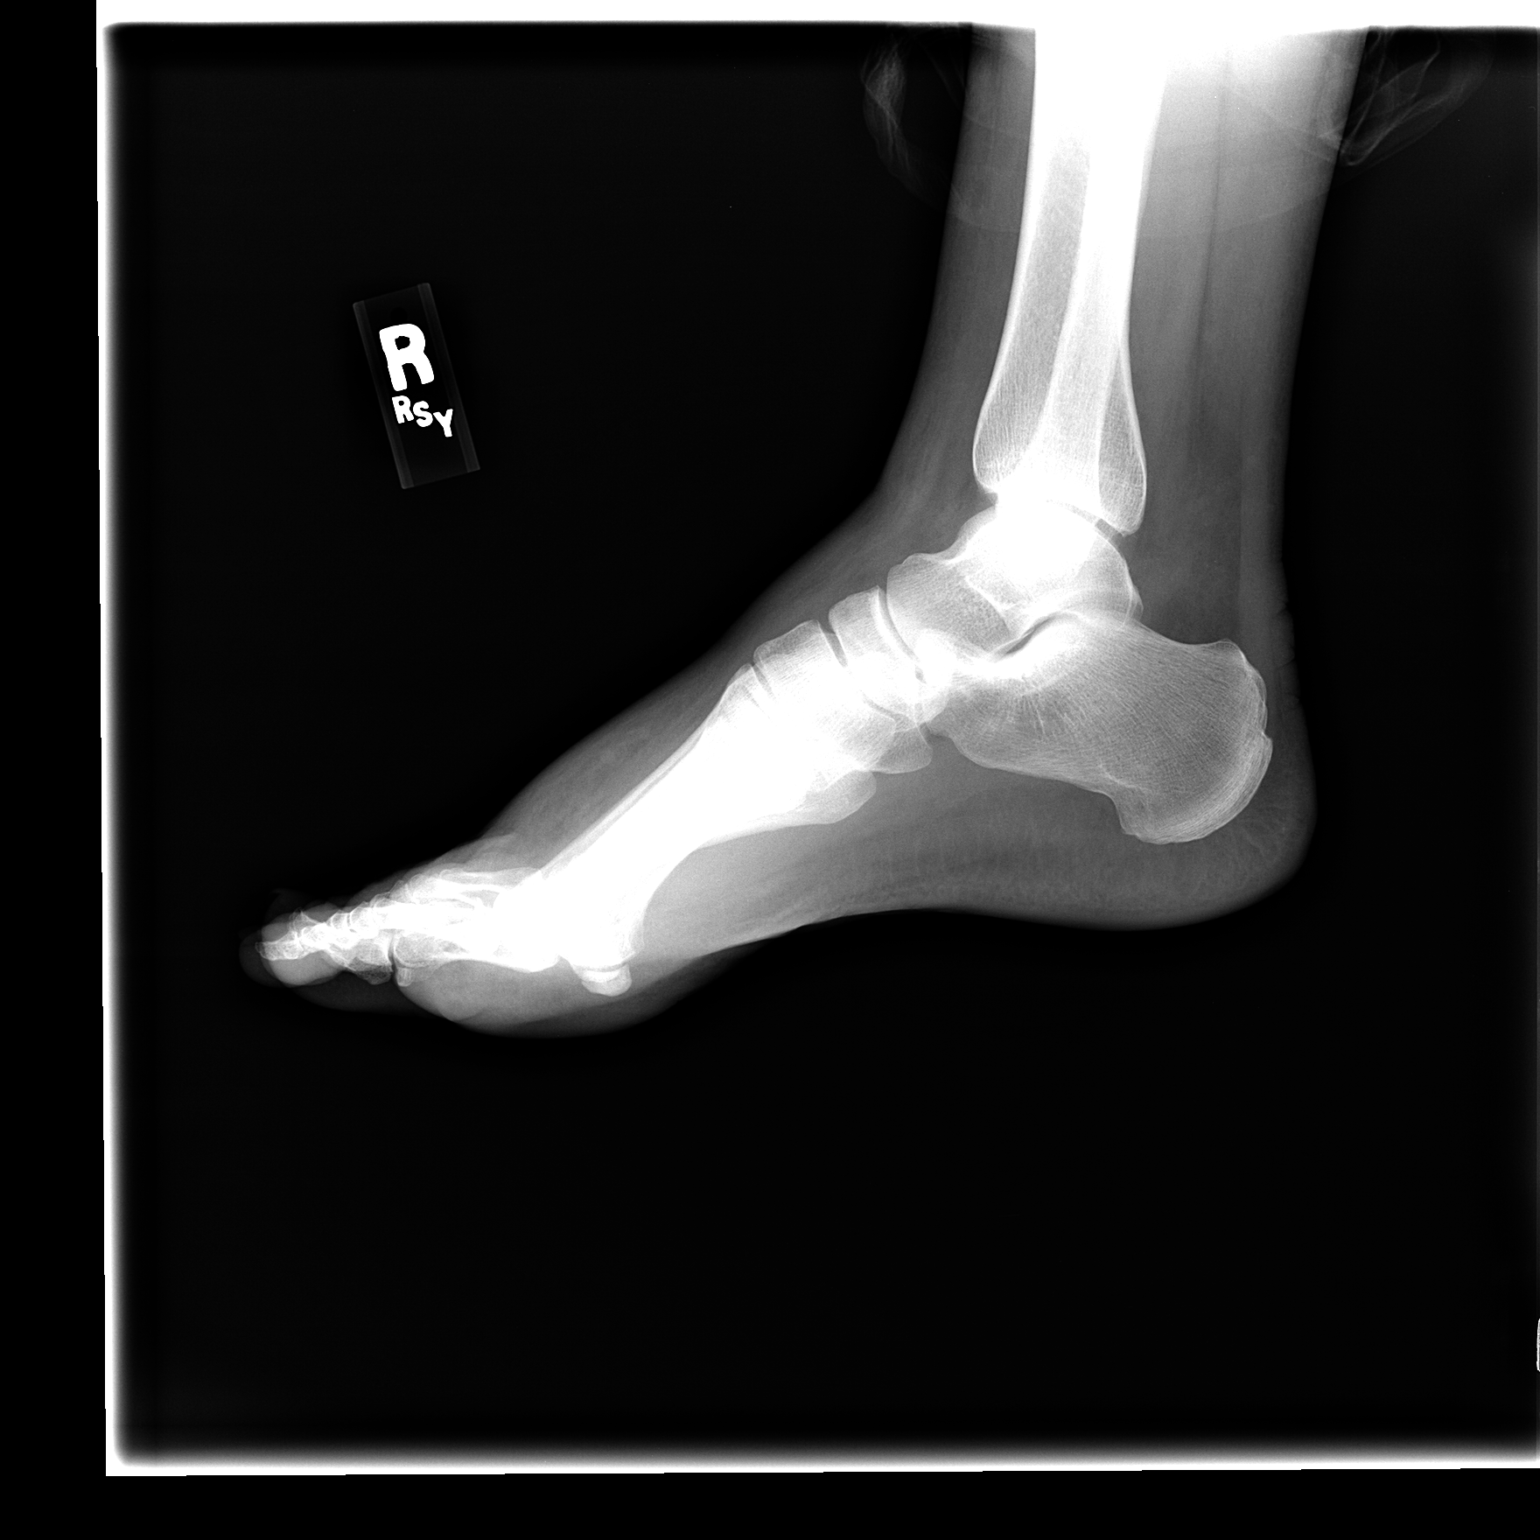

[2 of 2 positions shown; findings below may reference images not displayed]

FINDINGS: There is no evidence of periosteal reaction or cortical
destruction.  No abnormal soft tissue gas is present.  There is no
fracture or dislocation.  The joint compartments are maintained.
IMPRESSION: Normal right foot.

## 2011-01-09 ENCOUNTER — Encounter: Payer: Self-pay | Admitting: Family Medicine

## 2011-01-09 ENCOUNTER — Ambulatory Visit (INDEPENDENT_AMBULATORY_CARE_PROVIDER_SITE_OTHER): Payer: Medicare Other | Admitting: Family Medicine

## 2011-01-09 DIAGNOSIS — L03039 Cellulitis of unspecified toe: Secondary | ICD-10-CM

## 2011-01-09 DIAGNOSIS — L02619 Cutaneous abscess of unspecified foot: Secondary | ICD-10-CM

## 2011-01-09 DIAGNOSIS — L03031 Cellulitis of right toe: Secondary | ICD-10-CM

## 2011-01-09 NOTE — Assessment & Plan Note (Signed)
Improving. Continue Doxycyline. Reviewed foot x-ray, normal. Order written and given to pt for wound care/dressing changes.

## 2011-01-09 NOTE — Progress Notes (Signed)
  Subjective:    Patient ID: Jean Williams, female    DOB: January 01, 1961, 50 y.o.   MRN: [id number]  HPI Pt here for f/u R great toe ulcer. She is taking the Doxycycline as prescribed. 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 She denies worsening pain. She denies fever, chills. She denies increased drainage.    Review of Systems  Constitutional: Negative.   Skin: Positive for wound.       Objective:   Physical Exam  Constitutional: She appears well-developed and well-nourished. No distress.  Skin: Skin is warm and dry. She is not diaphoretic. There is erythema.             Assessment & Plan:

## 2011-01-09 NOTE — Patient Instructions (Addendum)
Ms Jean Williams,  Thank you for coming in today. It looks like your ulcer is healing well.   Cellulitis is an infection of the skin and the tissue beneath it. The area is typically red and tender. It is caused by germs (bacteria) (usually staph or strep) that enter the body through cuts or sores. Cellulitis most commonly occurs in the arms or lower legs.   HOME CARE INSTRUCTIONS  If you are given a prescription for medications which kill germs (antibiotics), take as directed until finished.   If the infection is on the arm or leg, keep the limb elevated as able.   Use a warm cloth several times per day to relieve pain and encourage healing.   Only take over-the-counter or prescription medicines for pain, discomfort, or fever as directed by your caregiver.  SEEK MEDICAL CARE IF:  An oral temperature above 101.4 develops, not controlled by medication.   The area of redness (inflammation) is spreading, there are red streaks coming from the infected site, or if a part of the infection begins to turn dark in color.   The joint or bone underneath the infected skin becomes painful after the skin has healed.   The infection returns in the same or another area after it seems to have gone away.   A boil or bump swells up. This may be an abscess.   New, unexplained problems such as pain or fever develop.  SEEK IMMEDIATE MEDICAL CARE IF:  You or your child feels drowsy or lethargic.   There is vomiting, diarrhea, or lasting discomfort or feeling ill (malaise) with muscle aches and pains.  MAKE SURE YOU:    Understand these instructions.   Will watch your condition.   Will get help right away if you are not doing well or get worse.  Document Released: 04/02/2005 Document Re-Released: 04/20/2009

## 2011-01-10 ENCOUNTER — Other Ambulatory Visit: Payer: Self-pay | Admitting: Family Medicine

## 2011-01-10 DIAGNOSIS — F209 Schizophrenia, unspecified: Secondary | ICD-10-CM

## 2011-01-10 MED ORDER — CLOZAPINE 100 MG PO TABS: 100.0000 mg | ORAL_TABLET | Freq: Two times a day (BID) | ORAL | Status: AC

## 2011-01-10 NOTE — Assessment & Plan Note (Signed)
Received refill request for Clozaril from [facility]. Pt was seen in clinic yesterday. Mood and though process were both stable.  Will refill Clozaril at some dose and administration.

## 2011-01-30 ENCOUNTER — Ambulatory Visit (HOSPITAL_COMMUNITY)
Admission: RE | Admit: 2011-01-30 | Discharge: 2011-01-30 | Disposition: A | Discharge status: Home / Self Care | Payer: Medicare Other | Source: Ambulatory Visit | Attending: Family Medicine | Admitting: Family Medicine

## 2011-01-30 ENCOUNTER — Ambulatory Visit: Payer: Medicare Other

## 2011-01-30 ENCOUNTER — Other Ambulatory Visit (HOSPITAL_COMMUNITY): Payer: Self-pay | Admitting: Hematology

## 2011-01-30 DIAGNOSIS — Z1231 Encounter for screening mammogram for malignant neoplasm of breast: Secondary | ICD-10-CM

## 2011-02-04 ENCOUNTER — Ambulatory Visit (INDEPENDENT_AMBULATORY_CARE_PROVIDER_SITE_OTHER): Payer: Medicare Other | Admitting: *Deleted

## 2011-02-04 ENCOUNTER — Telehealth: Payer: Self-pay | Admitting: Family Medicine

## 2011-02-04 DIAGNOSIS — Z111 Encounter for screening for respiratory tuberculosis: Secondary | ICD-10-CM

## 2011-02-04 DIAGNOSIS — IMO0001 Reserved for inherently not codable concepts without codable children: Secondary | ICD-10-CM

## 2011-02-04 NOTE — Telephone Encounter (Signed)
Patient calls back and she states her facility is requiring two PPD . She will return tomorrow.

## 2011-02-04 NOTE — Progress Notes (Signed)
Patient in for PPD , however she had a PPD in April 2012. Printed out letter again and gave to patient stating PPD was negative.

## 2011-02-04 NOTE — Telephone Encounter (Signed)
[name] daughter called because Jean Williams came in for a TB test this morning, but did not get one because she had one back in April.  The daughter is confused and would like an explanation because her mother lives in [facility] and is required to have the TB test.

## 2011-02-04 NOTE — Telephone Encounter (Signed)
Attempted to call daughter back , no answer. Will try again later.

## 2011-02-05 ENCOUNTER — Ambulatory Visit (INDEPENDENT_AMBULATORY_CARE_PROVIDER_SITE_OTHER): Payer: Medicare Other | Admitting: *Deleted

## 2011-02-05 DIAGNOSIS — Z111 Encounter for screening for respiratory tuberculosis: Secondary | ICD-10-CM

## 2011-02-07 ENCOUNTER — Ambulatory Visit (INDEPENDENT_AMBULATORY_CARE_PROVIDER_SITE_OTHER): Payer: Medicare Other | Admitting: *Deleted

## 2011-02-07 DIAGNOSIS — Z111 Encounter for screening for respiratory tuberculosis: Secondary | ICD-10-CM

## 2011-02-07 DIAGNOSIS — IMO0001 Reserved for inherently not codable concepts without codable children: Secondary | ICD-10-CM

## 2011-02-07 LAB — TB SKIN TEST: Induration: 0

## 2011-02-07 NOTE — Progress Notes (Signed)
PPD negative-0 mm. 

## 2011-02-27 ENCOUNTER — Ambulatory Visit (INDEPENDENT_AMBULATORY_CARE_PROVIDER_SITE_OTHER): Payer: Medicare Other | Admitting: Family Medicine

## 2011-02-27 VITALS — BP 140/79 | HR 128 | Temp 98.4°F | Wt 182.8 lb

## 2011-02-27 DIAGNOSIS — L858 Other specified epidermal thickening: Secondary | ICD-10-CM

## 2011-02-27 DIAGNOSIS — D485 Neoplasm of uncertain behavior of skin: Secondary | ICD-10-CM

## 2011-02-27 NOTE — Patient Instructions (Signed)
[facility]

## 2011-02-27 NOTE — Progress Notes (Signed)
  Subjective:    Patient ID: Jean Williams, female    DOB: [birth year], 50 y.o.   MRN: [id number]  HPI 1. Lesion on nose 4 week history of growth of lesion on nose. Central pigmented keratin with rounded outer edge. No discharge. No fever. No other symptoms.   Review of Systems See HPI    Objective:   Physical Exam  HENT:  Nose:         keratoacanthoma      Assessment & Plan:  1. Keratoacanthoma - [facility] - cannot rule out [facility]

## 2011-03-11 ENCOUNTER — Ambulatory Visit (INDEPENDENT_AMBULATORY_CARE_PROVIDER_SITE_OTHER): Payer: Medicare Other | Admitting: Family Medicine

## 2011-03-11 ENCOUNTER — Encounter: Payer: Self-pay | Admitting: Family Medicine

## 2011-03-11 VITALS — BP 132/87 | HR 120 | Temp 98.1°F | Wt 182.0 lb

## 2011-03-11 DIAGNOSIS — R000 Tachycardia, unspecified: Secondary | ICD-10-CM

## 2011-03-11 DIAGNOSIS — L02419 Cutaneous abscess of limb, unspecified: Secondary | ICD-10-CM

## 2011-03-11 DIAGNOSIS — L03119 Cellulitis of unspecified part of limb: Secondary | ICD-10-CM

## 2011-03-11 MED ORDER — DOXYCYCLINE HYCLATE 100 MG PO TABS: 100.0000 mg | ORAL_TABLET | Freq: Two times a day (BID) | ORAL | Status: AC

## 2011-03-11 NOTE — Patient Instructions (Signed)
You have an infection in your skin. Start taking antibiotics as soon as possible. Use antibacterial soap and cleanse the area tonight. Make an appointment tomorrow for a check up with Dr. Cristal Ford. If you have nausea, vomiting, severe weakness or worsening of symptoms, go to Urgent Care or ED tonight.  Cellulitis Cellulitis is an infection of the skin and the tissue beneath it. The area is typically red and tender. It is caused by germs (bacteria) (usually staph or strep) that enter the body through cuts or sores. Cellulitis most commonly occurs in the arms or lower legs.  HOME CARE INSTRUCTIONS  If you are given a prescription for medications which kill germs (antibiotics), take as directed until finished.   If the infection is on the arm or leg, keep the limb elevated as able.   Use a warm cloth several times per day to relieve pain and encourage healing.   See your caregiver for recheck of the infected site in 1 day or sooner if problems arise.   Only take over-the-counter or prescription medicines for pain, discomfort, or fever as directed by your caregiver.  SEEK MEDICAL CARE IF:  An oral temperature above 101 develops, not controlled by medication.   The area of redness (inflammation) is spreading, there are red streaks coming from the infected site, or if a part of the infection begins to turn dark in color.   The joint or bone underneath the infected skin becomes painful after the skin has healed.   The infection returns in the same or another area after it seems to have gone away.   A boil or bump swells up. This may be an abscess.   New, unexplained problems such as pain or fever develop.  SEEK IMMEDIATE MEDICAL CARE IF:  You or your child feels drowsy or lethargic.   There is vomiting, diarrhea, or lasting discomfort or feeling ill (malaise) with muscle aches and pains.  MAKE SURE YOU:   Understand these instructions.   Will watch your condition.   Will get help  right away if you are not doing well or get worse.  Document Released: 04/02/2005 Document Re-Released: 04/20/2009 [facility] Patient Information 2011 [place], [place].

## 2011-03-12 ENCOUNTER — Encounter: Payer: Self-pay | Admitting: Family Medicine

## 2011-03-12 ENCOUNTER — Ambulatory Visit (INDEPENDENT_AMBULATORY_CARE_PROVIDER_SITE_OTHER): Payer: Medicare Other | Admitting: Family Medicine

## 2011-03-12 VITALS — BP 121/87 | HR 112 | Temp 98.3°F | Wt 181.3 lb

## 2011-03-12 DIAGNOSIS — R000 Tachycardia, unspecified: Secondary | ICD-10-CM

## 2011-03-12 DIAGNOSIS — L02419 Cutaneous abscess of limb, unspecified: Secondary | ICD-10-CM

## 2011-03-12 NOTE — Assessment & Plan Note (Signed)
Given history of MRSA and rapid onset, will start empiric doxycycline to cover staph and strep. Appears to be actively draining therefore I&D not performed today. Patient will wash area with antibacterial soap to continue unroofing the lesion. Patient agrees to follow up tomorrow in clinic to evaluate the extent of spread or regression of this cellulitis, and we will decide about the need for draining if an abscess becomes more apparent.

## 2011-03-12 NOTE — Assessment & Plan Note (Signed)
>>  ASSESSMENT AND PLAN FOR TACHYCARDIA WRITTEN ON 03/12/2011 12:12 PM BY KONKOL, JILL N, MD  Noted again today at similar rate over past 2 years. Unsure if related to anxiety/psych vs organic cause. Will draw TSH and evaluate at follow up.

## 2011-03-12 NOTE — Patient Instructions (Signed)
Start doxycycline today. Make appointment if rash doesn't improve in next 2 days. Make appointment for a physical with Dr. Armen Pickup in next month.

## 2011-03-12 NOTE — Assessment & Plan Note (Signed)
Noted again today at similar rate over past 2 years. Unsure if related to anxiety/psych vs organic cause. Will draw TSH and evaluate at follow up.

## 2011-03-12 NOTE — Progress Notes (Signed)
  Subjective:    Patient ID: Jean Williams, female    DOB: 1960-10-23, 50 y.o.   MRN: [id number]  HPI  1. Boil/rash. Onset 2 days ago. Noticed a boil on her posterior right thigh that has started draining pus. No pre-existing skin wounds or cuts or scrapes. She has quickly had spread of redness surrounding this skin boil to cover large portion of anterior and posterior thigh. Feels warm. Denies fevers, chills, n/v, fatigue, syncope, dysuria, vaginal discharge.   Has a history of boils as a younger women and recently seen for toe cellulitis. On review of records there is a positive MRSA wound culture.   Review of Systems See HPI otherwise negative.    Objective:   Physical Exam  Vitals reviewed. Constitutional: She is oriented to person, place, and time. She appears well-developed and well-nourished. No distress.  HENT:  Head: Normocephalic and atraumatic.  Eyes: EOM are normal. Pupils are equal, round, and reactive to light.  Cardiovascular: Normal rate, regular rhythm and normal heart sounds.   No murmur heard. Pulmonary/Chest: Effort normal and breath sounds normal. No respiratory distress.  Musculoskeletal:       Significant erythema on thigh surrounding boil and extending circumferentially to anterior and medial aspects. Ink drawn around margins. Mild/moderate TTP. No mucosal involvement or ulceration. Draining boil posteriorly with purulent material and induration. Underlying abscess not easily palpable, but open area is 2-39mm wide.   Neurological: She is alert and oriented to person, place, and time.  Skin: She is not diaphoretic.          Assessment & Plan:

## 2011-03-13 NOTE — Assessment & Plan Note (Signed)
Improved overnight without antibiotics. No new areas of fluctuance that would benefit from I&D. Patient to start doxycycline tonight once she returns to her house. Encouraged continuation with good hygiene daily use antibacterial soap.

## 2011-03-13 NOTE — Assessment & Plan Note (Signed)
>>  ASSESSMENT AND PLAN FOR TACHYCARDIA WRITTEN ON 03/13/2011  7:25 AM BY TELLIS KATE SAILOR, MD  Asymptomatic. Could be related to anxiety vs thyroid vs iatrogenic vs less likely arrhythmia. Patient resistant to further workup today. Have discussed her returning for CPE and can be addressed by PCP at that time.

## 2011-03-13 NOTE — Progress Notes (Signed)
  Subjective:    Patient ID: Jean Williams, female    DOB: [birth year], 50 y.o.   MRN: [id number]  HPI  1. Leg cellulitis/boil. Comes for 1 day follow up. Patient living at half way house therefore had to wait for her doxycycline until today (meds are delivered from a pharmacy to the house). She has worked all day today. Feels like leg has improved even without antibiotics. Still some throbbing pain. Redness has regressed from our ink markings. Still draining minimal pus. No new areas of involvement. Denies n/v/d, syncope, fevers, difficulty walking.  2. Asymptomatic tachycardia. Patient denies palpitations, chest pain, SOB, fatigue. She has low level of tachycardia observed in office intermittently past few years.   Review of Systems See HPI otherwise negative.     Objective:   Physical Exam  Vitals reviewed. Constitutional: She appears well-developed and well-nourished. No distress.  HENT:  Head: Normocephalic and atraumatic.  Pulmonary/Chest: Effort normal.  Skin:       Erythema has regresses ~1-2cm from margins drawn yesterday. Mildly TTP. Posterior thigh has opening with scant pus visualized. Still no underlying abscesses palpated. No new areas of involvement visualized.           Assessment & Plan:

## 2011-03-13 NOTE — Assessment & Plan Note (Signed)
Asymptomatic. Could be related to anxiety vs thyroid vs iatrogenic vs less likely arrhythmia. Patient resistant to further workup today. Have discussed her returning for CPE and can be addressed by PCP at that time.

## 2011-03-14 ENCOUNTER — Telehealth: Payer: Self-pay | Admitting: Family Medicine

## 2011-03-14 NOTE — Telephone Encounter (Signed)
Pt is wanting to speak to nurse again.  Needs a note to stay home today.

## 2011-03-14 NOTE — Telephone Encounter (Signed)
Pt requesting a work note for staying home today due to viral illness.  Can fax to [facility] [phone] attn: Ewell Poe

## 2011-03-14 NOTE — Telephone Encounter (Signed)
Is on ABX and is dizzy and wants to know if it is caused by abx.  Needs to talk to nurse, asap

## 2011-03-14 NOTE — Telephone Encounter (Signed)
Patient started taking her Doxy on Thursday.  That evening she began to experience dizziness, nausea, headache and "flu like" symptoms.  Asked her if she was running a fever and she said that she felt hot.  Told her that I looked up the side effects of Doxy and those were not typical side effects of that medication.  Advised that she should continue taking the antibiotic and should treat this as a possible viral infection.    Recommended that she rest, drink plenty of fluids and alternate Tylenol and Motrin.  Patient agreeable.

## 2011-03-17 NOTE — Telephone Encounter (Signed)
faxed

## 2011-04-09 ENCOUNTER — Ambulatory Visit (INDEPENDENT_AMBULATORY_CARE_PROVIDER_SITE_OTHER): Payer: Self-pay | Admitting: Family Medicine

## 2011-04-09 VITALS — BP 114/85 | HR 121 | Temp 98.4°F | Wt 179.4 lb

## 2011-04-09 DIAGNOSIS — H6093 Unspecified otitis externa, bilateral: Secondary | ICD-10-CM

## 2011-04-09 DIAGNOSIS — F17200 Nicotine dependence, unspecified, uncomplicated: Secondary | ICD-10-CM

## 2011-04-09 DIAGNOSIS — H6090 Unspecified otitis externa, unspecified ear: Secondary | ICD-10-CM

## 2011-04-09 DIAGNOSIS — H60399 Other infective otitis externa, unspecified ear: Secondary | ICD-10-CM

## 2011-04-09 MED ORDER — NEOMYCIN-POLYMYXIN-HC 3.5-10000-1 OT SOLN: 3.0000 [drp] | Freq: Four times a day (QID) | OTIC | Status: AC

## 2011-04-09 NOTE — Patient Instructions (Signed)
QUIT SMOKING What is an outer ear infection? - An outer ear infection is a condition that can cause pain in the ear canal. The ear canal is the part of the ear that goes from the side of the head to the eardrum (figure 1). An outer ear infection is sometimes called "swimmer's ear." But an outer ear infection does not happen only in people who swim. People who do not swim can also get it. What causes an outer ear infection? - An outer ear infection happens when the skin in the ear canal gets irritated or scratched, and then gets infected. This can happen when a person: Puts cotton swabs, fingers, or other things inside the ear  Cleans the ear canal to remove ear wax  Swims on a regular basis - Water can soften the ear canal, which allows germs to infect the skin more easily.  Wears hearing aids, headphones, or ear plugs that can hurt the skin inside the ear  What are the symptoms of an outer ear infection? - The most common symptoms are: Pain inside the ear, especially when the ear is pulled or moved  Itching inside the ear  Fluid or pus leaking from the ear  Trouble hearing Is there a test for an outer ear infection? - No. There is no test. But your doctor or nurse should be able to tell if you have it by learning about your symptoms and looking in your ear. During the visit, your doctor might clean out your ear so that it can heal more quickly. How is an outer ear infection treated? - Treatments can include: Ear drops - Be sure to finish all the medicine, even if you feel better after a few days. When you use ear drops, you should: Lie on your side or tilt your head  Make sure the ear drops go into the ear canal  Stay in the same position or put a cotton ball in the ear for 20 minutes (after the ear drops are in) Medicines to relieve pain What else should I do during treatment? - It is important to keep the inside of your ear dry while the infection heals. You should not swim for 7 to 10 days  after starting treatment. But you can take a shower. To keep the ear dry during a shower, put some petroleum jelly (Vaseline) on a cotton ball, and then put the cotton ball in your ear. You should also avoid wearing hearing aids or headphones in the infected ear until your symptoms improve.  Should I see a doctor or nurse? - Call your doctor or nurse if: Your symptoms get worse  Your symptoms are not better 2 days after starting treatment  Can an outer ear infection be prevented? - You can reduce your chances of getting an outer ear infection by: Not sticking things in your ears or cleaning inside your ears - The inside of the ears do not usually need to be cleaned. It is normal to have some ear wax in your ears. Ear wax protects the ear canal. But if you are worried that you have too much ear wax, talk to your doctor or nurse. He or she can look in your ears and tell you how to clean them safely.  Following these tips if you swim a lot: Blow dry or shake your ears dry after you swim  Use ear drops that can prevent infections after you swim  Wear ear plugs to prevent water from getting  in your ears All topics are updated as new evidence becomes available and our peer review process is complete.  Literature review current through: Aug 2012.  This topic last updated: May 07, 2009.  Find Print Email  The content on the UpToDate website is not intended nor recommended as a substitute for medical advice, diagnosis, or treatment. Always seek the advice of your own physician or other qualified health care professional regarding any medical questions or conditions. The use of this website is governed by the UpToDate Terms of Use (click here) 2012 UpToDate, Inc.  Topic [phone] Version 3.0  GRAPHICS Normal ear   This figure shows the normal parts of the outer, middle, and inner ear.

## 2011-04-09 NOTE — Assessment & Plan Note (Signed)
Discussed cessation today. Pt is not ready to quit.

## 2011-04-09 NOTE — Assessment & Plan Note (Addendum)
Cortisporin drops to both ears. Discussed risk factor reduction including smoking cessation and decreased frequency of swimming. Handout also given.  May consider ENT referral if this continues to be a recurrent issue.

## 2011-04-09 NOTE — Progress Notes (Signed)
  Subjective:    Patient ID: Jean Williams, female    DOB: Dec 02, 1960, 50 y.o.   MRN: [id number]  HPI Bilateral ear pain x 1 week. Pt states that she noticed this at work. Mainly with ear pain and intermittent decreased hearing. No fevers, dizziness, headache. This has been a recurrent issue per pt. Pt has used ear drops in the past that minimally improved symptoms. Pt is also a chronic smoker and swims on a regular basis.    Review of Systems See HPI     Objective:   Physical Exam Gen: in bed, NAD SKIN: facial erythema diffusely consistent with acne rosacea, mild pustular changes .        Assessment & Plan:

## 2011-04-10 ENCOUNTER — Telehealth: Payer: Self-pay | Admitting: Family Medicine

## 2011-04-10 NOTE — Telephone Encounter (Signed)
Faxed written order to give ears drops to number provided.

## 2011-04-10 NOTE — Telephone Encounter (Signed)
Needs orders written for them to give pt the ear drops.  Needs today. pls fax to [facility] @ [phone]

## 2011-05-01 ENCOUNTER — Ambulatory Visit (INDEPENDENT_AMBULATORY_CARE_PROVIDER_SITE_OTHER): Payer: Medicare Other | Admitting: *Deleted

## 2011-05-01 DIAGNOSIS — Z23 Encounter for immunization: Secondary | ICD-10-CM

## 2011-05-13 DIAGNOSIS — C4401 Basal cell carcinoma of skin of lip: Secondary | ICD-10-CM | POA: Insufficient documentation

## 2011-06-24 ENCOUNTER — Ambulatory Visit (INDEPENDENT_AMBULATORY_CARE_PROVIDER_SITE_OTHER): Payer: Medicare Other | Admitting: Family Medicine

## 2011-06-24 VITALS — BP 131/88 | HR 121 | Temp 98.6°F | Ht 66.0 in | Wt 183.6 lb

## 2011-06-24 DIAGNOSIS — L02411 Cutaneous abscess of right axilla: Secondary | ICD-10-CM

## 2011-06-24 DIAGNOSIS — IMO0002 Reserved for concepts with insufficient information to code with codable children: Secondary | ICD-10-CM

## 2011-06-24 MED ORDER — DOXYCYCLINE HYCLATE 100 MG PO TABS: 100.0000 mg | ORAL_TABLET | Freq: Two times a day (BID) | ORAL | Status: AC

## 2011-06-24 MED ORDER — FLUOCINONIDE-E 0.05 % EX CREA: TOPICAL_CREAM | Freq: Two times a day (BID) | CUTANEOUS | Status: DC | PRN

## 2011-06-24 NOTE — Patient Instructions (Addendum)
Take the antibiotic (doxycycline) for your skin infection/boil.  Put warm compresses over boil to help it drain. Keep boil covered with a bandage and keep area clean.   If the rash or boil is getting bigger, you start having temperatures of 100.4 or greater, please return to the clinic.  Otherwise, please follow-up with me on Friday to have that area examined.

## 2011-06-24 NOTE — Progress Notes (Signed)
  Subjective:    Patient ID: Jean Williams, female    DOB: 17-Sep-1960, 50 y.o.   MRN: [id number]  HPI Abscess at right arm pit  Noticed a few days ago. Started spontaneously draining yesterday. Denies shaving under arm pits, any cuts.   Review of Systems Denies fevers, chills, nausea/vomiting.  Denies chest pain, heart palpitations.     Objective:   Physical Exam Gen: NAD, smells strongly of tobacco, dishevelled, smells of body odor Psych: appropriate to questions HEENT: poor dentition Lymph: no axillary lymphadenopathy CV: tachycardic Pulm: no increased WOB Skin: 2x5 cm area of induration and larger area of erythema at right axilla; not actively draining currently but able to visualize two pustules    Assessment & Plan:

## 2011-06-24 NOTE — Assessment & Plan Note (Signed)
No systemic signs of infection. Offered to I&D, however, patient declined since it is spontaneously draining. Rx for course of doxycycline due to history of recurrent boils, one of which was positive for MRSA in 10/2010. Will follow-up with me in a few days. If worsening, will consider I&D at that time. Patient amenable to plan.

## 2011-06-26 ENCOUNTER — Encounter: Payer: Self-pay | Admitting: Family Medicine

## 2011-06-26 DIAGNOSIS — J341 Cyst and mucocele of nose and nasal sinus: Secondary | ICD-10-CM | POA: Insufficient documentation

## 2011-06-26 DIAGNOSIS — C4401 Basal cell carcinoma of skin of lip: Secondary | ICD-10-CM

## 2011-07-03 ENCOUNTER — Ambulatory Visit: Payer: Medicare Other

## 2011-07-15 ENCOUNTER — Ambulatory Visit: Payer: Medicare Other | Admitting: Family Medicine

## 2011-08-15 ENCOUNTER — Ambulatory Visit (INDEPENDENT_AMBULATORY_CARE_PROVIDER_SITE_OTHER): Payer: Medicare Other | Admitting: Family Medicine

## 2011-08-15 ENCOUNTER — Encounter: Payer: Self-pay | Admitting: Family Medicine

## 2011-08-15 VITALS — BP 133/93 | HR 126 | Temp 98.4°F | Ht 66.0 in | Wt 182.0 lb

## 2011-08-15 DIAGNOSIS — H60399 Other infective otitis externa, unspecified ear: Secondary | ICD-10-CM

## 2011-08-15 DIAGNOSIS — H6093 Unspecified otitis externa, bilateral: Secondary | ICD-10-CM

## 2011-08-15 MED ORDER — CIPROFLOXACIN-DEXAMETHASONE 0.3-0.1 % OT SUSP: 4.0000 [drp] | Freq: Two times a day (BID) | OTIC | Status: AC

## 2011-08-15 MED ORDER — CIPROFLOXACIN-HYDROCORTISONE 0.2-1 % OT SUSP: 3.0000 [drp] | Freq: Two times a day (BID) | OTIC | Status: DC

## 2011-08-15 NOTE — Assessment & Plan Note (Signed)
Persistent otitis externa of both ears. Unclear she completed the course of drops last time. We'll try Cipro HC drops. Take 7-14 days and return in 14 days if still a problem.

## 2011-08-15 NOTE — Progress Notes (Signed)
  Subjective:    Patient ID: Jean Williams, female    DOB: Dec 27, 1960, 51 y.o.   MRN: [id number]  HPI  Patient presents today with itching and drainage as well as pain in the ears. She states this is present since October. She tried some drops in October when she was seen here for otitis externa. She thinks it never got better. She denies any fevers. She has not been swimming recently. She states that her hearing is a little bit less sharp since she began having itching. She denies headaches at this time. Patient has a significant history of sensitive skin as well as a recent removal of a basal cell carcinoma from her lip.  Review of Systems See above    Objective:   Physical Exam  Vital signs reviewed General appearance - alert, well appearing, and in no distress and oriented to person, place, and time Skin-bandage over lip, several areas of excoriation Ears-bilaterally ears with white discharge present right ear is sensitive with movement      Assessment & Plan:

## 2011-08-15 NOTE — Patient Instructions (Signed)
I am sending in a prescription for Cipro HC drops. Put 3 drops in each ear twice a day. Take for 7 days. If not better, use for another 7 days. Come back in 2 weeks if not resolved.

## 2011-08-15 NOTE — Progress Notes (Signed)
Addended by: Ellery Plunk L on: 08/15/2011 12:16 PM   Modules accepted: Orders

## 2011-08-18 ENCOUNTER — Telehealth: Payer: Self-pay | Admitting: Family Medicine

## 2011-08-18 NOTE — Telephone Encounter (Signed)
Caregiver need to speak with someone regarding instructions for ear drops med.  Discrepancy in the rx.  Need clarification of which is which.

## 2011-08-18 NOTE — Telephone Encounter (Signed)
Returned call to patient.  Spoke with caregiver Junious Dresser).  Patient can use Cipro ear drops---3 drops in each ear twice a day for 7 days.  Faxed copy of office note to [phone] per caregiver's request for pharmacy to review.  Gaylene Brooks, RN

## 2011-08-18 NOTE — Telephone Encounter (Signed)
Returned call to patient.  No answer.  Will try to call back later today.  Gaylene Brooks, RN

## 2011-10-06 ENCOUNTER — Encounter: Payer: Self-pay | Admitting: Family Medicine

## 2011-10-06 ENCOUNTER — Ambulatory Visit (INDEPENDENT_AMBULATORY_CARE_PROVIDER_SITE_OTHER): Payer: Medicare Other | Admitting: Family Medicine

## 2011-10-06 VITALS — BP 120/65 | HR 130 | Temp 98.3°F | Ht 66.0 in | Wt 186.4 lb

## 2011-10-06 DIAGNOSIS — L03317 Cellulitis of buttock: Secondary | ICD-10-CM | POA: Insufficient documentation

## 2011-10-06 MED ORDER — SULFAMETHOXAZOLE-TMP DS 800-160 MG PO TABS: 1.0000 | ORAL_TABLET | Freq: Two times a day (BID) | ORAL | Status: AC

## 2011-10-06 NOTE — Assessment & Plan Note (Signed)
History of multiple abscesses.  Today performed I & D- no significant drainage-more induration.  Will place on 7 days of bactrim for MRSA coverage.  Advised return visit if continued pain, swelling or other concerns.

## 2011-10-06 NOTE — Progress Notes (Signed)
  Subjective:    Patient ID: Jean Williams, female    DOB: [birth year], 51 y.o.   MRN: [id number]  HPI 1 week of boil on right buttock.  No drainage. Swollen, tender.  Has axillary boil last year.  Review of Systems No fever, chills.  I have reviewed patient's  PMH, FH, and Social history and Medications as related to this visit. Has recurrent cellulitis/abscess in different areas of body.     Objective:   Physical Exam GEN: NAD  Buttock: left buttock: central scab with scant drainage surround by induration and erythema- total approx 4x4 cm.  Procedure:  Incision and drainage of abscess Risks, benefits, and alternatives explained and consent obtained. Time out conducted. Surface cleaned with betadine x 3  cc lidocaine with epinephine infiltrated around abscess.- 3 cc Adequate anesthesia ensured. #11 blade used to make a stab incision into abscess- approx 1 cm Minimal Pus expressed with pressure.no significant cavity or loculations Hemostasis achieved. Pt stable. Aftercare and follow-up advised.        Assessment & Plan:

## 2011-10-06 NOTE — Patient Instructions (Signed)
Abscess  Care After  An abscess (also called a boil or furuncle) is an infected area that contains a collection of pus. Signs and symptoms of an abscess include pain, tenderness, redness, or hardness, or you may feel a moveable soft area under your skin. An abscess can occur anywhere in the body. The infection may spread to surrounding tissues causing cellulitis. A cut (incision) by the surgeon was made over your abscess and the pus was drained out. Gauze may have been packed into the space to provide a drain that will allow the cavity to heal from the inside outwards. The boil may be painful for 5 to 7 days. Most people with a boil do not have high fevers. Your abscess, if seen early, may not have localized, and may not have been lanced. If not, another appointment may be required for this if it does not get better on its own or with medications.  HOME CARE INSTRUCTIONS     Only take over-the-counter or prescription medicines for pain, discomfort, or fever as directed by your caregiver.    When you bathe, soak and then remove gauze or iodoform packs at least daily or as directed by your caregiver. You may then wash the wound gently with mild soapy water. Repack with gauze or do as your caregiver directs.   SEEK IMMEDIATE MEDICAL CARE IF:     You develop increased pain, swelling, redness, drainage, or bleeding in the wound site.    You develop signs of generalized infection including muscle aches, chills, fever, or a general ill feeling.    An oral temperature above 102 F (38.9 C) develops, not controlled by medication.   See your caregiver for a recheck if you develop any of the symptoms described above. If medications (antibiotics) were prescribed, take them as directed.  Document Released: 01/09/2005 Document Revised: 06/12/2011 Document Reviewed: 09/06/2007  ExitCare Patient Information 2012 [employer].

## 2011-10-10 ENCOUNTER — Other Ambulatory Visit: Payer: Self-pay | Admitting: Family Medicine

## 2011-10-10 ENCOUNTER — Encounter: Payer: Self-pay | Admitting: Family Medicine

## 2011-10-10 MED ORDER — FLUOCINONIDE-E 0.05 % EX CREA: TOPICAL_CREAM | Freq: Two times a day (BID) | CUTANEOUS | Status: DC | PRN

## 2011-10-10 NOTE — Telephone Encounter (Signed)
Faxed in refill. Patient will have to be seen in f/u for additional refills.

## 2011-10-20 ENCOUNTER — Telehealth: Payer: Self-pay | Admitting: Family Medicine

## 2011-10-20 NOTE — Telephone Encounter (Signed)
Is asking if the FL2 is ready to be picked up.

## 2011-10-20 NOTE — Telephone Encounter (Signed)
Forms are ready for pick up. [facility] called.

## 2011-12-09 ENCOUNTER — Ambulatory Visit (INDEPENDENT_AMBULATORY_CARE_PROVIDER_SITE_OTHER): Payer: Medicare Other | Admitting: Family Medicine

## 2011-12-09 ENCOUNTER — Encounter: Payer: Self-pay | Admitting: Family Medicine

## 2011-12-09 VITALS — BP 138/88 | HR 122 | Temp 98.3°F | Ht 66.0 in | Wt 180.0 lb

## 2011-12-09 DIAGNOSIS — D72829 Elevated white blood cell count, unspecified: Secondary | ICD-10-CM

## 2011-12-09 DIAGNOSIS — F209 Schizophrenia, unspecified: Secondary | ICD-10-CM

## 2011-12-09 DIAGNOSIS — D473 Essential (hemorrhagic) thrombocythemia: Secondary | ICD-10-CM

## 2011-12-09 NOTE — Progress Notes (Signed)
  Subjective:    Patient ID: Jean Williams, female    DOB: Dec 15, 1960, 51 y.o.   MRN: [id number]  HPI Patient referred here from our because of abnormal labs. Patient gets serial CBCs drawn by [facility]. Patient is currently on Clozaril. Patient had a CBC drawn on May 30 that showed a white count of 11.4 and a platelet count of 455. Pt was referred here because of this.  Patient denies any significant current issues or concerns. Patient denies any fevers or chills. Patient has a baseline history of recurrent folliculitis and skin abscesses. These have been relatively stable.  Patient's mood has been somewhat unstable recently. No homicidal or suicidal ideations. Pt would like to make an appointment with [facility] to discuss this.   Review of Systems See HPI, otherwise ROS negative     Objective:   Physical Exam Gen: up in chair, NAD HEENT: NCAT, EOMI, marked acne rosacea CV: RRR, no murmurs auscultated PULM:good overall air movement, no wheezes ABD: S/NT EXT: 2+ peripheral pulses SKIN: Noted resolving skin abscesses on L lateral gluteal region    Assessment & Plan:

## 2011-12-09 NOTE — Assessment & Plan Note (Signed)
With concomitant thrombocytosis. Likely secondary to resolving skin abscesses. Exam history overall reassuring. We'll plan for repeat CBC drawn 6 weeks to check for platelet trend. Gen. red flags discussed. Continue to follow.

## 2011-12-09 NOTE — Assessment & Plan Note (Signed)
No homicidal or suicidal ideations currently. Patient will make an appointment to discuss her mood with [facility] within the next week. Gen. psych red flags discussed.

## 2011-12-09 NOTE — Patient Instructions (Signed)
It was good to see today Your labs overall stable. I will like to repeat her labs in 6 weeks Be sure to make an appointment [facility] to discuss her mood Call if any questions.

## 2012-02-17 ENCOUNTER — Encounter: Payer: Self-pay | Admitting: Family Medicine

## 2012-02-17 ENCOUNTER — Ambulatory Visit (INDEPENDENT_AMBULATORY_CARE_PROVIDER_SITE_OTHER): Payer: Medicare Other | Admitting: Family Medicine

## 2012-02-17 VITALS — BP 122/77 | HR 98 | Temp 99.1°F | Wt 181.0 lb

## 2012-02-17 DIAGNOSIS — T162XXA Foreign body in left ear, initial encounter: Secondary | ICD-10-CM | POA: Insufficient documentation

## 2012-02-17 DIAGNOSIS — T169XXA Foreign body in ear, unspecified ear, initial encounter: Secondary | ICD-10-CM

## 2012-02-17 MED ORDER — HYDROCORTISONE-ACETIC ACID 1-2 % OT SOLN
4.0000 [drp] | Freq: Three times a day (TID) | OTIC | Status: DC
Start: 2012-02-17 — End: 2012-02-17

## 2012-02-17 MED ORDER — FLUOCINOLONE ACETONIDE 0.01 % OT OIL: 5.0000 [drp] | TOPICAL_OIL | Freq: Two times a day (BID) | OTIC | Status: AC

## 2012-02-17 NOTE — Assessment & Plan Note (Addendum)
Foreign body Left ear: - Qtip extracted by CMA without complications, patient tolerated well. - TM intact after removal of FB. No bleeding or erythema.  - Vosol otic solution prescribed for ear irritation for 10 days.--> changed d/t insurance co. Fluocinolone. - F/U: PRN or if patient becomes feverish or bleeding occurs from the ear.

## 2012-02-17 NOTE — Addendum Note (Signed)
Addended by: Felix Pacini A on: 02/17/2012 05:04 PM   Modules accepted: Orders

## 2012-02-17 NOTE — Patient Instructions (Addendum)
Ear Foreign Body  An ear foreign body is an object that is stuck in the ear. It is common for young children to put objects into the ear canal. These may include pebbles, beads, beans, and any other small objects which will fit. In adults, objects such as cotton swabs may become lodged in the ear canal. In all ages, the most common foreign bodies are insects that enter the ear canal.   SYMPTOMS   Foreign bodies may cause pain, buzzing or roaring sounds, hearing loss, and ear drainage.   HOME CARE INSTRUCTIONS    Keep all follow-up appointments with your caregiver as told.   Keep small objects out of reach of young children. Tell them not to put anything in their ears.  SEEK IMMEDIATE MEDICAL CARE IF:    You have bleeding from the ear.   You have increased pain or swelling of the ear.   You have reduced hearing.   You have discharge coming from the ear.   You have a fever.   You have a headache.  MAKE SURE YOU:    Understand these instructions.   Will watch your condition.   Will get help right away if you are not doing well or get worse.  Document Released: 06/20/2000 Document Revised: 06/12/2011 Document Reviewed: 02/09/2008  ExitCare Patient Information 2012 [employer].

## 2012-02-17 NOTE — Progress Notes (Signed)
Subjective:     Patient ID: Jean Williams, female   DOB: 1960/12/22, 51 y.o.   MRN: [id number]  HPI Foreign body left ear:  Jean Williams presented today as a same day appointment for a Qtip (cotton portion only), that became stuck in her ear last night. She reports she was having some itchy ear irritation and was digging at it with a Qtip. She denies discharge or bleeding from the ear. No fever or hearing loss.   Review of Systems See above HPI   Objective:   Physical Exam  Constitutional: She appears well-developed and well-nourished. No distress.  HENT:  Right Ear: External ear normal.  Left Ear: External ear normal.  Nose: Nose normal.       Left ear normal after QTIP removal. No bleeding or discharge noted. TM intact.  Eyes: Conjunctivae and EOM are normal. Right eye exhibits no discharge. Left eye exhibits no discharge.  Skin: Skin is warm and dry.

## 2012-04-20 ENCOUNTER — Ambulatory Visit (INDEPENDENT_AMBULATORY_CARE_PROVIDER_SITE_OTHER): Payer: Medicare Other | Admitting: *Deleted

## 2012-04-20 DIAGNOSIS — Z23 Encounter for immunization: Secondary | ICD-10-CM

## 2012-06-16 ENCOUNTER — Other Ambulatory Visit: Payer: Self-pay | Admitting: Family Medicine

## 2012-06-16 MED ORDER — METRONIDAZOLE 0.75 % EX CREA: TOPICAL_CREAM | Freq: Two times a day (BID) | CUTANEOUS | Status: DC

## 2012-09-29 ENCOUNTER — Telehealth: Payer: Self-pay | Admitting: Family Medicine

## 2012-09-29 NOTE — Telephone Encounter (Signed)
Adminstrator dropped off FL2 and a care plan forms to be filled out.

## 2012-10-01 MED ORDER — REFRESH P.M. OP OINT: TOPICAL_OINTMENT | OPHTHALMIC | Status: DC | PRN

## 2012-10-01 NOTE — Telephone Encounter (Signed)
Forms filled out and returned to [facility].

## 2012-10-01 NOTE — Telephone Encounter (Signed)
Forms mailed to [facility] [address], [place] [address] Jean Williams

## 2012-12-27 ENCOUNTER — Other Ambulatory Visit: Payer: Self-pay | Admitting: Family Medicine

## 2012-12-27 DIAGNOSIS — Z593 Problems related to living in residential institution: Secondary | ICD-10-CM | POA: Insufficient documentation

## 2012-12-27 MED ORDER — FAMOTIDINE 40 MG PO TABS: 20.0000 mg | ORAL_TABLET | Freq: Two times a day (BID) | ORAL | Status: DC

## 2012-12-27 NOTE — Telephone Encounter (Signed)
Decreased famotidine dose to 20 mg BID, 50% full dose, for maintenance treatment of dyspepsia. Sent new order to patient's group home.  Fax # [phone]

## 2013-03-04 ENCOUNTER — Encounter: Payer: Self-pay | Admitting: Family Medicine

## 2013-03-04 ENCOUNTER — Ambulatory Visit (INDEPENDENT_AMBULATORY_CARE_PROVIDER_SITE_OTHER): Payer: Medicare Other | Admitting: Family Medicine

## 2013-03-04 VITALS — BP 120/101 | HR 106 | Temp 99.0°F | Ht 66.0 in | Wt 184.0 lb

## 2013-03-04 DIAGNOSIS — J069 Acute upper respiratory infection, unspecified: Secondary | ICD-10-CM

## 2013-03-04 NOTE — Patient Instructions (Signed)
Jean Williams, it was nice seeing you today.  For your viral infection, please take mucinex 2 times per day, nasal saline at night, and you can use sudafed if you would like and stay hydrated as possible.  Smoking may prolong your course of this illness, and if you do not have improvement in 10-14 days, please give [place] a call back.    Thanks, Twana First. Dre Gamino, DO of [facility] 03/04/2013, 11:09 AM

## 2013-03-04 NOTE — Progress Notes (Signed)
Jean Williams is a 52 y.o. female who presents today for URI Sx x 2 days.  Pt states she had started having cough/congestion about two days ago.  Denies fever, chills, increased sputum production, LAD, sinus tenderness, worsening HA.    Past Medical History  Diagnosis Date  . 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 Current Every Day Smoker -- 1.50 packs/day  . Types: Cigarettes  Smokeless tobacco  . Not on file    No family history on file.  Current Outpatient Prescriptions on File Prior to Visit  Medication Sig Dispense Refill  . acetaminophen (TYLENOL) 325 MG tablet 2 tablets by mouth every 4 hours as need for pain/fever.      Marland Kitchen albuterol (PROAIR HFA) 108 (90 BASE) MCG/ACT inhaler Inhale 2 puffs into the lungs every 4 (four) hours as needed. As needed for wheezing or shortness of breath       . Artificial Tear Ointment (ARTIFICIAL TEARS) ointment Place into both eyes as needed. Keep at bedside  3.5 g  12  . calcium-vitamin D (OSCAL WITH D 500-200) 500-200 MG-UNIT per tablet Take 1 tablet by mouth 2 (two) times daily.        . cloZAPine (CLOZARIL) 100 MG tablet Take 1 tablet (100 mg total) by mouth 2 (two) times daily. Take 1/2 tab every am & 3 tabs every evening  105 tablet  3  . docusate sodium (COLACE) 100 MG capsule Take 1 capsule (100 mg total) by mouth 2 (two) times daily.  60 capsule  0  . famotidine (PEPCID) 40 MG tablet Take 0.5 tablets (20 mg total) by mouth 2 (two) times daily.  60 tablet  6  . FLOVENT HFA 110 MCG/ACT inhaler INHALE 1 PUFF TWICE      DAILY.  12 g  0  . Multiple Vitamins-Minerals (THERATRUM COMPLETE PO) Take 1 tablet by mouth daily.        Marland Kitchen PARoxetine (PAXIL-CR) 25 MG 24 hr tablet Take 25 mg by mouth every morning.        . simvastatin (ZOCOR) 20 MG tablet Take 1 tablet (20 mg total) by mouth at bedtime.  30 tablet  3   No current facility-administered medications on file prior to  visit.    ROS: Per HPI.  All other systems reviewed and are negative.   Physical Exam Filed Vitals:   03/04/13 1049  BP: 120/101  Pulse: 106  Temp: 99 F (37.2 C)    Physical Examination: General appearance - alert, well appearing, and in no distress Nose - normal and patent, no erythema, discharge or polyps Lymphatics - no palpable lymphadenopathy Heart - normal rate and regular rhythm

## 2013-03-04 NOTE — Assessment & Plan Note (Signed)
Pt has had Sx for 2 days, will have her use nasal saline at night, Delsym PRN for cough, Mucinex 600 mg BID, and decongestant if needed.  Will see back in 10-14 days if still have Sx.  No concern at this point for sinusitis as no TTP around sinus B/L

## 2013-03-25 ENCOUNTER — Ambulatory Visit (INDEPENDENT_AMBULATORY_CARE_PROVIDER_SITE_OTHER): Payer: Medicare Other | Admitting: Family Medicine

## 2013-03-25 ENCOUNTER — Encounter: Payer: Self-pay | Admitting: Family Medicine

## 2013-03-25 VITALS — BP 136/91 | HR 119 | Ht 66.0 in | Wt 181.0 lb

## 2013-03-25 DIAGNOSIS — B350 Tinea barbae and tinea capitis: Secondary | ICD-10-CM

## 2013-03-25 DIAGNOSIS — L02419 Cutaneous abscess of limb, unspecified: Secondary | ICD-10-CM

## 2013-03-25 DIAGNOSIS — L03115 Cellulitis of right lower limb: Secondary | ICD-10-CM | POA: Insufficient documentation

## 2013-03-25 DIAGNOSIS — F410 Panic disorder [episodic paroxysmal anxiety] without agoraphobia: Secondary | ICD-10-CM

## 2013-03-25 DIAGNOSIS — Z23 Encounter for immunization: Secondary | ICD-10-CM

## 2013-03-25 DIAGNOSIS — L299 Pruritus, unspecified: Secondary | ICD-10-CM

## 2013-03-25 LAB — COMPREHENSIVE METABOLIC PANEL
ALT: 39 U/L — ABNORMAL HIGH (ref 0–35)
CO2: 24 mEq/L (ref 19–32)
Creat: 1.04 mg/dL (ref 0.50–1.10)
Total Bilirubin: 0.4 mg/dL (ref 0.3–1.2)

## 2013-03-25 LAB — CBC WITH DIFFERENTIAL/PLATELET
Basophils Relative: 0 % (ref 0–1)
Eosinophils Absolute: 0 10*3/uL (ref 0.0–0.7)
Eosinophils Relative: 0 % (ref 0–5)
Lymphs Abs: 3.3 10*3/uL (ref 0.7–4.0)
MCH: 31.3 pg (ref 26.0–34.0)
MCHC: 34.8 g/dL (ref 30.0–36.0)
MCV: 89.9 fL (ref 78.0–100.0)
Platelets: 414 10*3/uL — ABNORMAL HIGH (ref 150–400)
RBC: 4.64 MIL/uL (ref 3.87–5.11)
RDW: 13.9 % (ref 11.5–15.5)

## 2013-03-25 MED ORDER — TERBINAFINE HCL 250 MG PO TABS: 500.0000 mg | ORAL_TABLET | Freq: Every day | ORAL | Status: AC

## 2013-03-25 MED ORDER — TERBINAFINE HCL 250 MG PO TABS: 250.0000 mg | ORAL_TABLET | Freq: Every day | ORAL | Status: DC

## 2013-03-25 MED ORDER — SULFAMETHOXAZOLE-TMP DS 800-160 MG PO TABS: 1.0000 | ORAL_TABLET | Freq: Two times a day (BID) | ORAL | Status: DC

## 2013-03-25 MED ORDER — LORATADINE 10 MG PO TABS: 10.0000 mg | ORAL_TABLET | Freq: Every day | ORAL | Status: DC

## 2013-03-25 NOTE — Patient Instructions (Addendum)
Jean Williams,  Thank you for coming in to see me today. For your scalp (tinea capitis): Take lamisil 500 mg daily for one week.  Come back to see me in 4 weeks to repeat liver function test and determine if another weeks is needed (we may need 3 total doses).   For your leg: Bactrim DS 1 tab twice daily for 7 days.  For your anxiety: Please call to [facility] for a f/u visit. I believe you will get great benefit from talking with your therapist.   Dr. Armen Pickup

## 2013-03-25 NOTE — Progress Notes (Signed)
  Subjective:    Patient ID: Jean Williams, female    DOB: 1960/07/25, 52 y.o.   MRN: [id number]  HPI 52 yo  F with schizophrenia, and pertinent history of panic attacks  L lip basal cell carcinoma, recurrent otitis externa, recurrent MRSA cellulitus/folliculits presents to discuss the following:  1. Anxiety: seen at [facility] (by "Amil Amen") every 3 months. No recent medication changes. Symptoms x 2 weeks. Symptoms include fatigue and anorexia. Stressors are her family and lack of support from them and work. She enjoys her work but was more relaxed over the summer.   2. Head bumps: x 2 weeks. Painful and swollen lumps. Pruritic. No fever. No treatment. Seem to be enlarging.  3. R leg bump: x 2 weeks. TTP. Erythematous. No drainage. Seems to be enlarging.   Review of Systems As per HPI   GAD 7: score of 13. 1-2 and 6. 2-1,4,5 and 7. 3-3.  Severity: very difficult.     Objective:   Physical Exam  Constitutional: She appears well-developed and well-nourished. No distress.  HENT:  Head:    Remainder of scalp exam normal   Skin:     Erythematous face with few small papules.       KOH: negative.      Assessment & Plan:

## 2013-03-28 ENCOUNTER — Encounter: Payer: Self-pay | Admitting: Family Medicine

## 2013-03-28 NOTE — Assessment & Plan Note (Signed)
A: R leg cellulitis suspect MRSA. Patient at high risk for recurrent MRSA cellulitis given her chronic picking and scratching that I believe is related to her psychotic disorder.  P: tx with bactrim.

## 2013-03-28 NOTE — Assessment & Plan Note (Addendum)
A: exam consistent with tinea capitis with kerion although KOH is negative. P: treat with pulses lamisil 500 mg x 7 days (weekly) for 2-3 months with f/u q 4 weeks to re-examine and check LFTs. This course is preferred due to patient being on clorazil and desire not to reduce hepatic metabolism of her antipsychotic. Of course if scalp lesions do not respond to either Lamisil or bactrim referral to derm would be indicated for biopsy.

## 2013-03-28 NOTE — Assessment & Plan Note (Signed)
A: positive screen for anxiety P: advised patient return to [facility] for CBT.

## 2013-03-30 ENCOUNTER — Encounter: Payer: Self-pay | Admitting: Family Medicine

## 2013-04-04 ENCOUNTER — Encounter: Payer: Self-pay | Admitting: Family Medicine

## 2013-04-12 ENCOUNTER — Other Ambulatory Visit: Payer: Self-pay | Admitting: Family Medicine

## 2013-04-12 DIAGNOSIS — Z593 Problems related to living in residential institution: Secondary | ICD-10-CM

## 2013-04-12 NOTE — Telephone Encounter (Signed)
6 month review of medications for patient.  List from [facility] [address] [place] [address]

## 2013-04-22 ENCOUNTER — Other Ambulatory Visit: Payer: Self-pay | Admitting: Family Medicine

## 2013-04-22 NOTE — Telephone Encounter (Signed)
Performing medication list review for [facility]. Faxing to [facility].  - Discontinuing mucinex ER 600mg  tab PO 1 tab BID - Discontinuing saline mist 0.65% nose spray prn as pt does not ever use - KEEPING proAir inhaler - pt has asthma so she needs this available in case of emergencies/wheezing/shortness of breath.  Also signed [facility] refill authorization requests clarifying: - Lamisil directions: Take 500mg  by mouth daily for 1 week, follow up in clinic in 4 weeks, will likely repeat for 1-2 courses pending follow-up in clinic - DISCONTINUING mucinex and saline mist due to no longer needed, per request written on refill authorization.  Leona Singleton, MD 04/22/2013 12:58 PM

## 2013-04-25 ENCOUNTER — Ambulatory Visit: Payer: Medicare Other | Admitting: Family Medicine

## 2013-05-18 ENCOUNTER — Ambulatory Visit (INDEPENDENT_AMBULATORY_CARE_PROVIDER_SITE_OTHER): Payer: Medicare Other | Admitting: Family Medicine

## 2013-05-18 ENCOUNTER — Encounter: Payer: Self-pay | Admitting: Family Medicine

## 2013-05-18 VITALS — BP 143/94 | HR 120 | Temp 98.8°F | Ht 66.0 in | Wt 183.0 lb

## 2013-05-18 DIAGNOSIS — F410 Panic disorder [episodic paroxysmal anxiety] without agoraphobia: Secondary | ICD-10-CM

## 2013-05-18 DIAGNOSIS — F411 Generalized anxiety disorder: Secondary | ICD-10-CM

## 2013-05-18 MED ORDER — PAROXETINE HCL ER 37.5 MG PO TB24: 37.5000 mg | ORAL_TABLET | ORAL | Status: DC

## 2013-05-18 NOTE — Patient Instructions (Signed)

## 2013-05-18 NOTE — Assessment & Plan Note (Signed)
Increased Paxil to 37.5 mg

## 2013-05-18 NOTE — Progress Notes (Signed)
  Subjective:    Patient ID: Jean Williams, female    DOB: [birth year], 52 y.o.   MRN: [id number]  Anxiety Presents for initial visit. The problem has been gradually worsening. Symptoms include chest pain, excessive worry, nervous/anxious behavior and shortness of breath. Patient reports no suicidal ideas. Symptoms occur most days. The most recent episode lasted 1 hour. The severity of symptoms is interfering with daily activities and causing significant distress. The symptoms are aggravated by social activities and specific phobias.   Risk factors: schizophrenic. Past treatments include SSRIs and counseling (CBT) (anti-psychotics).   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 On Paxil and thinks it works well.  Reports MD at [facility] asked [place] to see her.  They had previously tried Risperdal which did not help.   Review of Systems  Respiratory: Positive for shortness of breath.   Cardiovascular: Positive for chest pain.  Psychiatric/Behavioral: Positive for hallucinations. Negative for suicidal ideas, sleep disturbance and dysphoric mood. The patient is nervous/anxious. The patient is not hyperactive.        Objective:   Physical Exam  Vitals reviewed. Constitutional: She is oriented to person, place, and time. She appears well-developed and well-nourished.  Neck: Neck supple.  Cardiovascular: Normal rate and regular rhythm.   Pulmonary/Chest: Effort normal.  Abdominal: Soft. There is no tenderness.  Musculoskeletal: Normal range of motion.  Neurological: She is alert and oriented to person, place, and time.  Skin: Skin is warm.  Psychiatric: Her mood appears anxious. Her affect is blunt. Her speech is not rapid and/or pressured and not delayed. She is slowed. Thought content is not delusional. Cognition and memory are impaired. She expresses inappropriate judgment.  Minimal insight, understanding.          Assessment & Plan:

## 2013-07-25 ENCOUNTER — Other Ambulatory Visit: Payer: Self-pay | Admitting: Family Medicine

## 2013-07-26 NOTE — Telephone Encounter (Signed)
Will refill for 3 months but due for re-assessment. Please have pt seen in clinic. Hilton Sinclair, MD

## 2013-08-01 MED ORDER — FAMOTIDINE 20 MG PO TABS: ORAL_TABLET | ORAL | Status: DC

## 2013-08-01 NOTE — Telephone Encounter (Signed)
On further assessment, patient has been on 20mg  BID for long duration without re-assessment; will reduce to maintenance dose famotidine 20mg  daily at bedtime for 3 month supply. Pt due for re-assessment.  Hilton Sinclair, MD

## 2013-08-01 NOTE — Addendum Note (Signed)
Addended by: Conni Slipper T on: 08/01/2013 03:11 PM   Modules accepted: Orders

## 2013-08-24 ENCOUNTER — Other Ambulatory Visit: Payer: Self-pay | Admitting: Family Medicine

## 2013-08-24 DIAGNOSIS — F411 Generalized anxiety disorder: Secondary | ICD-10-CM

## 2013-08-24 MED ORDER — PAROXETINE HCL ER 37.5 MG PO TB24: 37.5000 mg | ORAL_TABLET | ORAL | Status: DC

## 2013-09-09 ENCOUNTER — Ambulatory Visit (INDEPENDENT_AMBULATORY_CARE_PROVIDER_SITE_OTHER): Payer: Medicare Other | Admitting: Family Medicine

## 2013-09-09 ENCOUNTER — Encounter: Payer: Self-pay | Admitting: Family Medicine

## 2013-09-09 VITALS — BP 135/92 | HR 124 | Temp 98.3°F | Ht 66.0 in | Wt 183.0 lb

## 2013-09-09 DIAGNOSIS — H60399 Other infective otitis externa, unspecified ear: Secondary | ICD-10-CM

## 2013-09-09 DIAGNOSIS — H6090 Unspecified otitis externa, unspecified ear: Secondary | ICD-10-CM

## 2013-09-09 MED ORDER — CIPROFLOXACIN-DEXAMETHASONE 0.3-0.1 % OT SUSP: 4.0000 [drp] | Freq: Two times a day (BID) | OTIC | Status: DC

## 2013-09-09 MED ORDER — HYDROCORTISONE VALERATE 0.2 % EX OINT: 1.0000 "application " | TOPICAL_OINTMENT | Freq: Two times a day (BID) | CUTANEOUS | Status: DC

## 2013-09-09 MED ORDER — CIPROFLOXACIN-DEXAMETHASONE 0.3-0.1 % OT SUSP: 4.0000 [drp] | Freq: Two times a day (BID) | OTIC | Status: AC

## 2013-09-09 MED ORDER — HYDROCORTISONE VALERATE 0.2 % EX OINT: 1.0000 "application " | TOPICAL_OINTMENT | Freq: Two times a day (BID) | CUTANEOUS | Status: AC

## 2013-09-09 NOTE — Patient Instructions (Signed)
Laquandra,  Thank you for coming in today. Use drops for one week. Use ointment on the outside of both ears for two weeks.  Dr. Adrian Blackwater

## 2013-09-09 NOTE — Assessment & Plan Note (Addendum)
A: patient with OE and changes to pinna from chronic irritation. P: Stop use of q tips Avoid scratching as much as possible Ciprodex for one week Westcort ointment to external ear BID for two weeks. Faxed printed rx as an order to patient's group home[facility] at [phone]

## 2013-09-09 NOTE — Progress Notes (Signed)
   Subjective:    Patient ID: Jean Williams, female    DOB: 16-Jan-1961, 53 y.o.   MRN: [id number]  HPI 53 year old female with history of recurrent otitis externa presents for same-day visit discuss the following:  #1 bilateral ear pruritus and discharge: X3-4 weeks. Patient does use Q-tips. She denies exposures to pools. She denies use of topicals inside of her ear. She denies fever. She admits to a sensation of ear fullness and being under water.  Soc hx: smoker 1 PPD   Review of Systems As per HPI     Objective:   Physical Exam BP 135/92  Pulse 124  Temp(Src) 98.3 F (36.8 C) (Oral)  Ht 5\' 6"  (1.676 m)  Wt 183 lb (83.008 kg)  BMI 29.55 kg/m2  SpO2 97%  LMP 06/10/2011 General appearance: alert, cooperative and no distress Ears: Both external ears are scaly and erythematous with thickened skin. Otoscopic exam reveal white discharge in both ears as well.  Ear irrigated Recheck: white discharge removed with irrigation. Able to visualize R TM.      Assessment & Plan:

## 2013-09-12 ENCOUNTER — Ambulatory Visit: Payer: Medicare Other | Admitting: Family Medicine

## 2013-09-13 ENCOUNTER — Telehealth: Payer: Self-pay | Admitting: Family Medicine

## 2013-09-13 NOTE — Telephone Encounter (Signed)
Was seen by Dr. Adrian Blackwater 09/09/13. Marlowe Kays calls needing written orders for instructions on where to apply the ointment patient was prescribed on 09/09/13. Also needs order of how long to use ear drops.

## 2013-09-13 NOTE — Telephone Encounter (Signed)
Called back to  [facility] to verify fax # [phone], this is correct. Orders faxed over.

## 2013-09-26 ENCOUNTER — Telehealth: Payer: Self-pay | Admitting: Family Medicine

## 2013-09-26 NOTE — Telephone Encounter (Signed)
I received [facility] personal care physician authorization and care plan that needs to be signed and sent back to facility. I have never met patient and just became her PCP. So that I can fill this out accurately, I need to see her in clinic for office visit. For example, some of the medications listed are different than what is in our EMR. Please have her make this appt.   Hilton Sinclair, MD

## 2013-09-26 NOTE — Telephone Encounter (Signed)
Spoke with emergency contact connie moore.  She made an appt for 10-13-13.  Advised to bring medications to this appt. Fatima Fedie,CMA

## 2013-10-10 ENCOUNTER — Telehealth: Payer: Self-pay | Admitting: Family Medicine

## 2013-10-10 NOTE — Telephone Encounter (Signed)
Received notification from [facility] that patient's paroxetin ER tab 37.5mg  will no longer be covered. Pt also received this notice, per letter. No formulary alternative listed. We could try paroxetine 40mg  daily (paroxetine 20mg  tabs on $4 list at [facility]). No need to call, as I will see pt and caregiver 4/9 and we can discuss if they want this switch at that time.  Hilton Sinclair, MD

## 2013-10-13 ENCOUNTER — Ambulatory Visit (INDEPENDENT_AMBULATORY_CARE_PROVIDER_SITE_OTHER): Payer: Medicare Other | Admitting: Family Medicine

## 2013-10-13 ENCOUNTER — Encounter: Payer: Self-pay | Admitting: Family Medicine

## 2013-10-13 VITALS — BP 140/98 | HR 133 | Temp 99.7°F | Ht 66.0 in | Wt 178.0 lb

## 2013-10-13 DIAGNOSIS — F209 Schizophrenia, unspecified: Secondary | ICD-10-CM

## 2013-10-13 DIAGNOSIS — J069 Acute upper respiratory infection, unspecified: Secondary | ICD-10-CM

## 2013-10-13 DIAGNOSIS — F17200 Nicotine dependence, unspecified, uncomplicated: Secondary | ICD-10-CM

## 2013-10-13 DIAGNOSIS — R000 Tachycardia, unspecified: Secondary | ICD-10-CM

## 2013-10-13 NOTE — Progress Notes (Signed)
Patient ID: ELAF CLAUSON, female   DOB: [birth year], 53 y.o.   MRN: [id number] Subjective:   CC: Patient presents today to meet new MD.  HPI:   Schizophrenia - Pt is tangential during visit making history difficulty. Reports "life is upside down" but later reports "I'm really okay." No one from group home with her today, but pt reports taking clozaril and paxil regularly. Group home helps her with medications. She sees a Teacher, music at [employer] (last seek last month.) Thinks she has another appt coming up within a week. Denies visual or auditory hallucinations.  "Sick" - States she has had runny nose, cough, and sneeze for 3 days. Denies dyspnea. Reports improving symptoms overall.   Chronic tachycardia - Per chart review of last visits. Does not report any chest pain, worsened dyspnea, dizziness, or fainting.   Review of Systems - Per HPI.  PMH: - Reviewed. - No hosp since moved to [place] in 1992.  Med: - Taking flovent PRN and albuterol daily (opposite of how prescribed). No one from group home available to review medications.  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 - Smokes 1-1.5 ppd x 42 years - Denies alc or drugs    Objective:  Physical Exam BP 140/98  Pulse 133  Temp(Src) 99.7 F (37.6 C) (Oral)  Ht 5\' 6"  (1.676 m)  Wt 178 lb (80.74 kg)  BMI 28.74 kg/m2  SpO2 98%  LMP 06/10/2011 GEN: NAD, pleasant CV: tachycardia, regular rhythm, no m/r/g though limited by pt talking during exam PULM: Faint wheezing throughout, normal effort ABD: S/NT/ND PSYCH: Mildly disheveled, some reservation in answering questions, some flight of ideas/tangential thinking, some paranoid thoughts ("the games people play"), intermittent tearfulness. Denies visual or auditory hallucinations. Does feel people can feel others' thoughts., no SI/HI. SKIN: Facial rosacea  Assessment:     ZAKYA HALABI is a 53 y.o. female with h/o schizophrenia here to meet new MD, also with cold  symptoms and chronic tachycardia.    Plan:     Schizophrenia - Stable, no SI/HI or hallucinations, does have tangential thought and some paranoia. - F/u regularly at [facility].  - Obtain records from [facility] at next visit. - Continue current medications - patient does not think she needs refills. - F/u with me every 2-3 months.  Viral URI - Afebrile with normal O2 sat. - Reassurance, rest, hydrate. - Gave work note for yesterday and today.  Tachycardia - Asymptomatic, chronic. O2 saturation 99%. However, could lead to cardiomyopathy from chronic strain. Reports using albuterol daily and flovent PRN (albuterol may be causing her chronic tachycardia).  - Educated to use albuterol prn and flovent daily. Wrote on care plan for group home. - Pt to set up f/u with me at next available to evaluate.  # Health Maintenance: Return for this. Would benefit from having group home member come as well to help with hx and med review.  Follow-up: Follow up in 1 week for evaluation of chronic tachycardia. F/u after that to fill out FL2 form.   Hilton Sinclair, MD [place]

## 2013-10-14 DIAGNOSIS — J069 Acute upper respiratory infection, unspecified: Secondary | ICD-10-CM | POA: Insufficient documentation

## 2013-10-14 NOTE — Assessment & Plan Note (Signed)
Precontemplational. - Discussed benefit of quitting.

## 2013-10-14 NOTE — Assessment & Plan Note (Signed)
Asymptomatic, chronic. O2 saturation 99%. However, could lead to cardiomyopathy from chronic strain. Reports using albuterol daily and flovent PRN (albuterol may be causing her chronic tachycardia).  - Educated to use albuterol prn and flovent daily. Wrote on care plan for group home. - Pt to set up f/u with me at next available to evaluate.

## 2013-10-14 NOTE — Assessment & Plan Note (Signed)
Stable, no SI/HI or hallucinations, does have tangential thought and some paranoia. - F/u regularly at [facility].  - Obtain records from [facility] at next visit. - Continue current medications - patient does not think she needs refills. - F/u with me every 2-3 months.

## 2013-10-14 NOTE — Assessment & Plan Note (Signed)
Afebrile with normal O2 sat. - Reassurance, rest, hydrate. - Gave work note for yesterday and today.

## 2013-10-14 NOTE — Assessment & Plan Note (Signed)
>>  ASSESSMENT AND PLAN FOR TACHYCARDIA WRITTEN ON 10/14/2013  2:47 PM BY THEKKEKANDAM, MARIA T, MD  Asymptomatic, chronic. O2 saturation 99%. However, could lead to cardiomyopathy from chronic strain. Reports using albuterol  daily and flovent  PRN (albuterol  may be causing her chronic tachycardia).  - Educated to use albuterol  prn and flovent  daily. Wrote on care plan for group home. - Pt to set up f/u with me at next available to evaluate.

## 2013-11-01 ENCOUNTER — Encounter: Payer: Self-pay | Admitting: Family Medicine

## 2013-11-01 NOTE — Progress Notes (Signed)
Form given to CSW Hunt Oris). Jean Williams

## 2013-11-01 NOTE — Progress Notes (Signed)
FL2 dropped off by caregiver to be completed.  Please mail to address on form completion form when completed.

## 2013-11-02 ENCOUNTER — Telehealth: Payer: Self-pay | Admitting: Clinical

## 2013-11-02 NOTE — Progress Notes (Signed)
Clinical Education officer, museum (CSW) has called [facility] numerous times today to inquire about forms left- no answer.  Hunt Oris, MSW, [place]

## 2013-11-02 NOTE — Telephone Encounter (Signed)
error 

## 2013-11-02 NOTE — Progress Notes (Signed)
Clinical Education officer, museum (CSW) contacted [place] with [facility] regarding the FL2. Marlowe Kays asks for MD to review both FL2 and Care plan, and sign them she is also requesting that pt medication list in Epic be updated. [facility].  CSW will mail forms once signed by PCP.  Hunt Oris, MSW, [place]

## 2013-11-08 NOTE — Progress Notes (Signed)
Done- will place on Norma's desk. Thanks!  Hilton Sinclair, MD

## 2013-11-08 NOTE — Addendum Note (Signed)
Addended by: Conni Slipper T on: 11/08/2013 04:34 PM   Modules accepted: Orders

## 2013-11-08 NOTE — Telephone Encounter (Signed)
Received form and correcting our medication list based on list given by facility. - discontinuing albuterol as pt is not taking this. - Will fill out remainder of form and place on [employer].  Hilton Sinclair, MD

## 2013-11-09 ENCOUNTER — Other Ambulatory Visit: Payer: Self-pay | Admitting: Family Medicine

## 2013-11-09 NOTE — Progress Notes (Signed)
FL2 and Care plan have been signed by PCP. CSW has placed forms in the mail & called Almetta Lovely with [facility] to notify.  Hunt Oris, MSW, [place]

## 2013-11-11 ENCOUNTER — Telehealth: Payer: Self-pay | Admitting: Clinical

## 2013-11-11 NOTE — Telephone Encounter (Signed)
Jean Williams called CSW requesting a script of flovent and artifical tear ointment sent to pt pharmacy. Jean Williams also inquiring about FL2 - CSW confirmed it was mailed 11/09/13.   CSW has notified PCP of requested scripts.  Hunt Oris, MSW, [place]

## 2013-11-14 ENCOUNTER — Other Ambulatory Visit: Payer: Self-pay | Admitting: Family Medicine

## 2013-11-14 DIAGNOSIS — J45909 Unspecified asthma, uncomplicated: Secondary | ICD-10-CM

## 2013-11-14 MED ORDER — FLUTICASONE PROPIONATE HFA 110 MCG/ACT IN AERO: INHALATION_SPRAY | RESPIRATORY_TRACT | Status: DC

## 2013-11-14 MED ORDER — REFRESH P.M. OP OINT: TOPICAL_OINTMENT | OPHTHALMIC | Status: DC | PRN

## 2013-12-02 ENCOUNTER — Encounter: Payer: Self-pay | Admitting: Family Medicine

## 2013-12-02 ENCOUNTER — Telehealth: Payer: Self-pay | Admitting: *Deleted

## 2013-12-02 ENCOUNTER — Ambulatory Visit (INDEPENDENT_AMBULATORY_CARE_PROVIDER_SITE_OTHER): Payer: Medicare Other | Admitting: Family Medicine

## 2013-12-02 VITALS — BP 138/80 | HR 92 | Temp 98.1°F | Ht 66.0 in | Wt 185.0 lb

## 2013-12-02 DIAGNOSIS — R000 Tachycardia, unspecified: Secondary | ICD-10-CM

## 2013-12-02 DIAGNOSIS — H60399 Other infective otitis externa, unspecified ear: Secondary | ICD-10-CM

## 2013-12-02 DIAGNOSIS — F17200 Nicotine dependence, unspecified, uncomplicated: Secondary | ICD-10-CM

## 2013-12-02 DIAGNOSIS — F209 Schizophrenia, unspecified: Secondary | ICD-10-CM

## 2013-12-02 DIAGNOSIS — H6090 Unspecified otitis externa, unspecified ear: Secondary | ICD-10-CM

## 2013-12-02 MED ORDER — CIPROFLOXACIN HCL 750 MG PO TABS: 750.0000 mg | ORAL_TABLET | Freq: Two times a day (BID) | ORAL | Status: DC

## 2013-12-02 MED ORDER — HYDROCORTISONE-ACETIC ACID 1-2 % OT SOLN: 4.0000 [drp] | Freq: Three times a day (TID) | OTIC | Status: DC
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
## 2013-12-02 NOTE — Patient Instructions (Addendum)
Good to see you.  For your schizophrenia, please continue to see psychiatrist every 3 months. See the therapist more frequently than every three months, and if that is not possible, come see me at least monthly. If you develop severe symptoms, seeing things, hearing things that are not there, thoughts of harming yourself or others, seek immediate care at the Emergency Room. Ask Beverly Sessions about ACT teams.  For your ears, this looks like an outer ear infection with an middle ear infection. - Use ciprofloxacin twice daily for 10 days along with the ear drops. - come back to see me in 1 week to see if this is getting better. - Seek immediate care if you develop fevers or worse symptoms.  You can stop the artificial tears ointment. I will discontinue it.  Follow up with me in 1 week.  Hilton Sinclair, MD

## 2013-12-02 NOTE — Telephone Encounter (Signed)
Received message from Coralyn Mark, Pharmacist at [facility] stating that [facility] ear drops are not covered under pt's insurance.  Cortisporin-HC ear drops are covered.  Ok for formulary change per verbal order given Dr. Dianah Field.  Derl Barrow, RN

## 2013-12-08 ENCOUNTER — Telehealth: Payer: Self-pay | Admitting: Family Medicine

## 2013-12-08 NOTE — Assessment & Plan Note (Signed)
Normal pulse today. No chest pain, dyspnea, dizziness, or syncope. - Monitor.  - Consider beta blocker if returns and persistent.

## 2013-12-08 NOTE — Assessment & Plan Note (Signed)
Precontemplational. - Continue to emphasize and contact me when ready.

## 2013-12-08 NOTE — Assessment & Plan Note (Signed)
>>  ASSESSMENT AND PLAN FOR TACHYCARDIA WRITTEN ON 12/08/2013  4:25 PM BY THEKKEKANDAM, MARIA T, MD  Normal pulse today. No chest pain, dyspnea, dizziness, or syncope. - Monitor.  - Consider beta blocker if returns and persistent.

## 2013-12-08 NOTE — Assessment & Plan Note (Signed)
She takes clozaril and paxil. She sees psychiatrist regularly but not a therapist. She is not very interested in seeing a therapist at this time but is amenable to coming in more frequently to see me. - Continue clozaril and paxil regularly. - F/u with me monthly and psychiatrist every 3 months. See if you can f/u with therapist more regularly. - Return precautions reviewed for SI/HI. - Ask Beverly Sessions about ACT team. Will also ask our social worker Constance Holster for guidance on setting patient up with ACT.

## 2013-12-08 NOTE — Telephone Encounter (Signed)
Jean Williams, any chance we can get ACT team services for this patient?   Thanks!  Hilton Sinclair, MD PGY-2, [place]

## 2013-12-08 NOTE — Assessment & Plan Note (Signed)
Extensive scaling of ear canal likely related to OE with no tenderness behind ear, no fevers or chills. Also some TM dullness/clouding.  - Will treat with ciprofloxacin BID x 10 days and acetic acid-hydrocortisone otic solution. - F/u 1 week to eval for improvement or seek immediate care if worsening.

## 2013-12-09 NOTE — Telephone Encounter (Signed)
CSW has left a message with Beverly Sessions to place a referral for ACT services.  Hunt Oris, MSW, [place]

## 2013-12-30 ENCOUNTER — Ambulatory Visit (INDEPENDENT_AMBULATORY_CARE_PROVIDER_SITE_OTHER): Payer: Medicare Other | Admitting: Family Medicine

## 2013-12-30 ENCOUNTER — Telehealth: Payer: Self-pay | Admitting: *Deleted

## 2013-12-30 ENCOUNTER — Encounter: Payer: Self-pay | Admitting: Family Medicine

## 2013-12-30 VITALS — BP 118/85 | HR 123 | Temp 98.3°F | Wt 183.0 lb

## 2013-12-30 DIAGNOSIS — F410 Panic disorder [episodic paroxysmal anxiety] without agoraphobia: Secondary | ICD-10-CM

## 2013-12-30 DIAGNOSIS — R000 Tachycardia, unspecified: Secondary | ICD-10-CM

## 2013-12-30 DIAGNOSIS — H60399 Other infective otitis externa, unspecified ear: Secondary | ICD-10-CM

## 2013-12-30 DIAGNOSIS — H6093 Unspecified otitis externa, bilateral: Secondary | ICD-10-CM

## 2013-12-30 MED ORDER — NEOMYCIN-COLIST-HC-THONZONIUM 3.3-3-10-0.5 MG/ML OT SUSP: 3.0000 [drp] | Freq: Four times a day (QID) | OTIC | Status: DC

## 2013-12-30 MED ORDER — CARBAMIDE PEROXIDE 6.5 % OT SOLN: 10.0000 [drp] | Freq: Two times a day (BID) | OTIC | Status: DC

## 2013-12-30 NOTE — Telephone Encounter (Signed)
Coralyn Mark, Pharmacist with [facility] called needing a verbal order to change neomycin -TC to [facility].  Neomycin TC is not covered by pt's insurance.  Derl Barrow, RN

## 2013-12-30 NOTE — Telephone Encounter (Signed)
This is fine. Thank you.  Jean Sinclair, MD

## 2013-12-30 NOTE — Patient Instructions (Signed)
For your ears, Use both ear drops. Follow up with me next week (early) to look at and clean out your ears. If you get fevers or pain, seek immediate care. Do not dig into ears if possible.  For your psychiatric medications Continue taking as prescribed. Keep a panic diary - list the date/time they occur, how many minutes/hours they last, and any triggers. Also list what helps relieve them. Plan for panic: At onset, take 10 deep breaths and remove yourself from the situation. See if this helps. Find out if Santiago Glad knows anything about ACT. Follow up in 2 weeks to re-discuss.  For your pulse, Have someone check and write this down at the group home and bring this with you when you come back. If you have chest pain or trouble breathing at any time, seek immediate care.

## 2013-12-30 NOTE — Progress Notes (Signed)
Patient ID: Jean Williams, female   DOB: 1960-09-04, 53 y.o.   MRN: [id number] Subjective:   CC: Follow up ears and psychotropic medications  HPI:   Follow up ears Jean Williams is a 53 y.o. female with recent dx of otitis media and externa. She was treated with ciprofloxacin and otic solution, both of which she used. Her ears are still very itchy but had improved some with ear drops. She denies fevers, pain, or hearing issues. She does not stick q tips in ears but does dig in them with fingers during visit.  Psychotropic medications Patient continues taking clozaril and paxil regularly and seeing psychiatrist at [facility] and therapist. She has had her clozaril increased 3 weeks ago and is hesitant to take this as prescribed due to concern about panic attacks. She reports she still feels panic every morning but is unable to clarify duration for me. She states people notice a difference in me" when she is off her clozaril. She denies VH or AH but reports still having "fantasies" in her head that she likes. She states she feels more "clearheaded" today.  Tachycardia Noted in vitals today and multiple prior visits though not last visit. Pt does not report chest pain or dyspnea. She is always somewhat anxious at visits, which may be contributor. She states she is always nervous about getting here.   Review of Systems - Per HPI.   Smoking status: Continues to smoke    Objective:  Physical Exam BP 118/85  Pulse 123  Temp(Src) 98.3 F (36.8 C) (Oral)  Wt 183 lb (83.008 kg)  LMP 06/10/2011 GEN: NAD HEENT: Caked white dry area present in external auditory meatus bilaterally; no tenderness during exam or at mastoid process bilaterally; TM not purulent or bulging but does have some dullness on exam Skin: Face erythematous at baseline; scalp very dry and with plaques Psych: Mood and affect euthymic, mild flight of ideas and slightly tangential, normal speech, anxious-appearing NEURO: Awake, alert, no  focal deficits PULM: Normal effort CV: Regular rhythm, tachycardic ABD: Soft, nondistended EXTR: Moves all extremities fully and purposefully    Assessment:     Jean Williams is a 53 y.o. female with h/o schizophrenia here for f/u of ears and psychotropic medications.    Plan:     Follow up ears On exam, still has signs of extensive otitis externa with unclear media involvement. Completed ciprofloxacin and otic solution. With no fevers but some benefit from drops, will plan to continue. No posterior auricular or mastoid tenderness. - Restarting otic solution. - Use debrox as well to soften any wax present. - F/u in 3-5 days to have softened wax and dead skin cells cleaned out/debridement. - Return precautions reviewed. - Do not pick ears if possible.  Psychotropic medications and panic attacks Discussed importance of continuing medications as prescribed. Group home helps her get these on time and she is amenable.  - Continue paxil and clozaril. - Continue following up with therapist and psychiatrist regularly. - F/u with me in 2 weeks to discuss panic.  - Pt would benefit from CBT and continuing SSRI. She would also benefit from ACT team. Referral placed and I have asked pt to follow up about this with her therapist/psychiatrist and I will f/u with Constance Holster, our SW. - Panic seems to occur more when she is trying to catch bus.   - Plan: Have a friend come with you as much as possible, take 10 seconds of deep breathing when feeling symptoms coming  on and get yourself away from the situation; keep a trigger diary so we can figure out exactly what is causing panic (time, duration, trigger, any relieving factors). - Continue anxiety class that she went to one time.  Tachycardia Normal BP and no chest pain or dyspnea reported. Likely related to anxiety and checking right when pt arrives. - Asking group home to keep log of patient's pulse. - Consider EKG and beta blocker at f/u.  Other  issues: - Scalp dryness and face redness need eval  # Health Maintenance: Needs visit  Follow-up: Follow up in 2 weeks for f/u panic.   Hilton Sinclair, MD [place]

## 2014-01-01 NOTE — Assessment & Plan Note (Signed)
Normal BP and no chest pain or dyspnea reported. Likely related to anxiety and checking right when pt arrives. - Asking group home to keep log of patient's pulse. - Consider EKG and beta blocker at f/u.

## 2014-01-01 NOTE — Assessment & Plan Note (Signed)
Discussed importance of continuing medications as prescribed. Group home helps her get these on time and she is amenable.  - Continue paxil and clozaril. - Continue following up with therapist and psychiatrist regularly. - F/u with me in 2 weeks to discuss panic.  - Pt would benefit from CBT and continuing SSRI. She would also benefit from ACT team. Referral placed and I have asked pt to follow up about this with her therapist/psychiatrist and I will f/u with Constance Holster, our SW. - Panic seems to occur more when she is trying to catch bus.   - Plan: Have a friend come with you as much as possible, take 10 seconds of deep breathing when feeling symptoms coming on and get yourself away from the situation; keep a trigger diary so we can figure out exactly what is causing panic (time, duration, trigger, any relieving factors). - Continue anxiety class that she went to one time.

## 2014-01-01 NOTE — Assessment & Plan Note (Signed)
On exam, still has signs of extensive otitis externa with unclear media involvement. Completed ciprofloxacin and otic solution. With no fevers but some benefit from drops, will plan to continue. No posterior auricular or mastoid tenderness. - Restarting otic solution. - Use debrox as well to soften any wax present. - F/u in 3-5 days to have softened wax and dead skin cells cleaned out/debridement. - Return precautions reviewed. - Do not pick ears if possible.

## 2014-01-01 NOTE — Assessment & Plan Note (Signed)
>>  ASSESSMENT AND PLAN FOR TACHYCARDIA WRITTEN ON 01/01/2014  7:30 PM BY THEKKEKANDAM, MARIA T, MD  Normal BP and no chest pain or dyspnea reported. Likely related to anxiety and checking right when pt arrives. - Asking group home to keep log of patient's pulse. - Consider EKG and beta blocker at f/u.

## 2014-01-02 ENCOUNTER — Telehealth: Payer: Self-pay | Admitting: *Deleted

## 2014-01-02 ENCOUNTER — Telehealth: Payer: Self-pay | Admitting: Clinical

## 2014-01-02 NOTE — Telephone Encounter (Signed)
CSW contacted Jean Williams regarding pt qualifying for Act Team services. Marlowe Kays informed CSW that pt does not qualify for Act Team through [facility] because pt does not have Medicaid. Pt does not qualify for Medicaid because of her trust fund, per [place].  CSW has left a message for [facility] [phone] inquiring about services.  Hunt Oris, MSW, [place]

## 2014-01-02 NOTE — Telephone Encounter (Signed)
Received fax from [facility] stating that Theratrum complete tablet is no longer available.  Can medication be changed to Sentry multivitamin when current supply is consumed?  Please send new Rx if approved.  Derl Barrow, RN

## 2014-01-02 NOTE — Telephone Encounter (Signed)
CSW received a call back from [facility] who confirmed that unfortunately pt cannot receive services because she does not have Medicaid.  Hunt Oris, MSW, [place]

## 2014-01-02 NOTE — Telephone Encounter (Signed)
Message copied by Hunt Oris on Mon Jan 02, 2014  2:56 PM ------      Message from: Conni Slipper T      Created: Sun Jan 01, 2014  7:31 PM       Hey lady, can you check on the status of referral to ACT team for Ms O'Neal? She states she has not heard anything about it from her group home or psychiatrist.            Thx!            Verdis Frederickson ------

## 2014-01-05 ENCOUNTER — Ambulatory Visit: Payer: Medicare Other | Admitting: Family Medicine

## 2014-01-09 NOTE — Telephone Encounter (Signed)
OK are we out of options?  ThxHilton Sinclair, MD

## 2014-01-13 MED ORDER — SENTRY PO TABS: 1.0000 | ORAL_TABLET | Freq: Every day | ORAL | Status: DC

## 2014-01-13 NOTE — Telephone Encounter (Signed)
Sure - I have discontinued Theratrum Complete and ordered Sentry multivitamins.  Hilton Sinclair, MD

## 2014-01-13 NOTE — Addendum Note (Signed)
Addended by: Conni Slipper T on: 01/13/2014 01:56 PM   Modules accepted: Orders, Medications

## 2014-01-19 ENCOUNTER — Ambulatory Visit (HOSPITAL_COMMUNITY)
Admission: RE | Admit: 2014-01-19 | Discharge: 2014-01-19 | Disposition: A | Discharge status: Home / Self Care | Payer: Medicare Other | Source: Ambulatory Visit | Attending: Family Medicine | Admitting: Family Medicine

## 2014-01-19 ENCOUNTER — Ambulatory Visit (INDEPENDENT_AMBULATORY_CARE_PROVIDER_SITE_OTHER): Payer: Medicare Other | Admitting: Family Medicine

## 2014-01-19 ENCOUNTER — Encounter: Payer: Self-pay | Admitting: Family Medicine

## 2014-01-19 VITALS — BP 130/83 | HR 120 | Temp 98.4°F | Ht 66.0 in | Wt 187.1 lb

## 2014-01-19 DIAGNOSIS — I498 Other specified cardiac arrhythmias: Secondary | ICD-10-CM | POA: Insufficient documentation

## 2014-01-19 DIAGNOSIS — R000 Tachycardia, unspecified: Secondary | ICD-10-CM

## 2014-01-19 DIAGNOSIS — F209 Schizophrenia, unspecified: Secondary | ICD-10-CM

## 2014-01-19 DIAGNOSIS — I517 Cardiomegaly: Secondary | ICD-10-CM | POA: Diagnosis not present

## 2014-01-19 DIAGNOSIS — H6092 Unspecified otitis externa, left ear: Secondary | ICD-10-CM

## 2014-01-19 DIAGNOSIS — R9389 Abnormal findings on diagnostic imaging of other specified body structures: Secondary | ICD-10-CM | POA: Diagnosis not present

## 2014-01-19 DIAGNOSIS — H60399 Other infective otitis externa, unspecified ear: Secondary | ICD-10-CM

## 2014-01-19 LAB — COMPREHENSIVE METABOLIC PANEL
ALK PHOS: 88 U/L (ref 39–117)
ALT: 39 U/L — AB (ref 0–35)
AST: 31 U/L (ref 0–37)
Albumin: 3.9 g/dL (ref 3.5–5.2)
BUN: 11 mg/dL (ref 6–23)
CHLORIDE: 107 meq/L (ref 96–112)
CO2: 25 mEq/L (ref 19–32)
CREATININE: 0.85 mg/dL (ref 0.50–1.10)
Calcium: 9.1 mg/dL (ref 8.4–10.5)
Glucose, Bld: 143 mg/dL — ABNORMAL HIGH (ref 70–99)
POTASSIUM: 4.3 meq/L (ref 3.5–5.3)
Sodium: 139 mEq/L (ref 135–145)
Total Bilirubin: 0.6 mg/dL (ref 0.2–1.2)
Total Protein: 6.3 g/dL (ref 6.0–8.3)

## 2014-01-19 LAB — TSH: TSH: 1.353 u[IU]/mL (ref 0.350–4.500)

## 2014-01-19 LAB — CBC
HCT: 41.3 % (ref 36.0–46.0)
Hemoglobin: 14.5 g/dL (ref 12.0–15.0)
MCH: 31 pg (ref 26.0–34.0)
MCHC: 35.1 g/dL (ref 30.0–36.0)
MCV: 88.2 fL (ref 78.0–100.0)
PLATELETS: 308 10*3/uL (ref 150–400)
RBC: 4.68 MIL/uL (ref 3.87–5.11)
RDW: 14.2 % (ref 11.5–15.5)
WBC: 8.7 10*3/uL (ref 4.0–10.5)

## 2014-01-19 NOTE — Progress Notes (Signed)
Patient ID: Jean Williams, female   DOB: 1961-04-18, 53 y.o.   MRN: [id number] Subjective:   CC: Followup ears, followup tachycardia  HPI:   Followup ears Jean Williams is a 53 year old female who at the last couple of visits has had otitis externa. At the last visit, we started Debrox due to presumed cerumen impaction as well as neomycin eardrops. The patient reports that symptoms have improved, the left ear still itchy and she sometimes scratches inside of it. She denies fevers or chills.  Followup tachycardia At the last visit, I have asked group home to check patient's pulse occasionally. She reports this did not get done. She denies chest pain, shortness of breath, dizziness, or fainting. She feels it is related to anxiety. She has severe anxiety every morning when just to get transportation anywhere. On review of her last 2 visits at least, pulse has been in the 120s. Her last EKG was in 2010, and appears sinus tachycardia. It is also read as possible left atrial enlargement and septal infarct, age undetermined. Does not report any dysuria or increased cough. Denies fevers or chills.  Review of Systems - Per HPI. Additionally, she reports that panic attacks occur daily in the mornings when she is trying to get to the bus. She feels that medications are contributing. She has a mental health appointment today.   Smoking status: Current every day smoker, resistant to quitting or decreasing at this time    Objective:  Physical Exam BP 130/83  Pulse 120  Temp(Src) 98.4 F (36.9 C) (Oral)  Ht 5\' 6"  (1.676 m)  Wt 187 lb 1.6 oz (84.868 kg)  BMI 30.21 kg/m2  LMP 06/10/2011 GEN: NAD CV: Tachycardia to 120 bpm, no discernible murmurs, rubs, or gallops PULM: faint wheezing throughout, no cough, normal effort Extremities: No lower extremity edema or calf tenderness or erythema HEENT: Right TM and EAM clear, left EAM with soft whitish sloughed material, left TM mildly pearly erythematous,  nontender No sinus tenderness or mastoid process tenderness bilaterally Psych: Mood and affect euthymic, speech is mildly pressured and rapid, endorses fantasies but no visual or auditory hallucinations Skin: No rash or cyanosis    Assessment:     Jean Williams is a 53 y.o. female with h/o schizophrenia, major depressive disorder, otitis externa, tobacco dependence, asthma, and unspecified tachycardia here for followup of otitis externa and tachycardia.    Plan:     Followup ear symptoms Previously diagnosis otitis externa with a component of otitis media in the left ear. Appears chronic. This is improved but still present. Patient has taken a course of oral and topical antibiotics. She is having itching but no pain or fevers. -Slough cleaned out in clinic out of left ear today. -Should return to clinic every 2 weeks or so for recheck and cleaning out. -Recommended starting vinegar solution drops daily and either Debrox or mineral oil/essential oil 2-3 times weekly in each year to keep wax soft -Will call and discuss with Almetta Lovely who works at group home.  Followup tachycardia Chronic. Most likely related to anxiety, dehydration, or chronic lung disease with patient's history of asthma. She is stable appearing otherwise with other vitals normal. O2 sat in clinic is 95% on room air. Differential diagnosis also includes infective process, medication, PE, thyroid disease, anemia, hypoxemia, pain, pheochromocytomas, stimulants or other drug use. -EKG today looks very similar to prior in 2010. -Will check CBC, TSH, and CMET today. -Discussed tobacco cessation the patient is not willing at  this time. -Encouraged maintaining adequate hydration -At follow up can consider cardiology referral and/r starting beta blocker. -Followup in 2 weeks. Return precautions reviewed. -At follow up, can evaluate if patient has had formal workup for asthma. Will also need to ask patient about caffeine intake  and any illicit drug use.  # Health Maintenance: Not discussed  Follow-up: Follow up in 2 weeks for followup of tachycardia. -Still need visit to followup panic attacks. Patient states she has an appointment with psychiatrist today Jean Williams). In the meantime, emphasized to patient that it is important she continue medications prescribed by her psychiatric provider.    Hilton Sinclair, MD [place]

## 2014-01-19 NOTE — Patient Instructions (Signed)
We are getting an EKG and labs today and I will call if any are not normal, for your fast heart rate. I will want to follow up with you in at most 2 weeks to rediscuss this. Be sure to stay hydrated. Seek immediate care if you develop chest pain, trouble breathing, dizziness or fainting. Keep working on quitting smoking.  Continue using debrox or you can do mineral oil / essential oil 2 drops per ear 2-3 times weekly. I also want you using a vinegar solution with some vinegar and some water daily in both ears, espeically the left.  Ask behavioral health re: medications for your anxiety.

## 2014-01-19 NOTE — Assessment & Plan Note (Signed)
Previously diagnosis otitis externa with a component of otitis media in the left ear. Appears chronic. This is improved but still present. Patient has taken a course of oral and topical antibiotics. She is having itching but no pain or fevers. -Slough cleaned out in clinic out of left ear today. -Should return to clinic every 2 weeks or so for recheck and cleaning out. -Recommended starting vinegar solution drops daily and either Debrox or mineral oil/essential oil 2-3 times weekly in each year to keep wax soft -Will call and discuss with Almetta Lovely who works at group home.

## 2014-01-19 NOTE — Assessment & Plan Note (Signed)
Chronic. Most likely related to anxiety, dehydration, or chronic lung disease with patient's history of asthma. She is stable appearing otherwise with other vitals normal. O2 sat in clinic is 95% on room air. Differential diagnosis also includes infective process, medication, PE, thyroid disease, anemia, hypoxemia, pain, pheochromocytomas, stimulants or other drug use. -EKG today looks very similar to prior in 2010. -Will check CBC, TSH, and CMET today. -Discussed tobacco cessation the patient is not willing at this time. -Encouraged maintaining adequate hydration -At follow up can consider cardiology referral and/r starting beta blocker. -Followup in 2 weeks. Return precautions reviewed. -At follow up, can evaluate if patient has had formal workup for asthma. Will also need to ask patient about caffeine intake and any illicit drug use.

## 2014-01-19 NOTE — Assessment & Plan Note (Signed)
>>  ASSESSMENT AND PLAN FOR TACHYCARDIA WRITTEN ON 01/19/2014  4:33 PM BY CURTIS HADASSAH DASEN, MD  Chronic. Most likely related to anxiety, dehydration, or chronic lung disease with patient's history of asthma. She is stable appearing otherwise with other vitals normal. O2 sat in clinic is 95% on room air. Differential diagnosis also includes infective process, medication, PE, thyroid disease, anemia, hypoxemia, pain, pheochromocytomas, stimulants or other drug use. -EKG today looks very similar to prior in 2010. -Will check CBC, TSH, and CMET today. -Discussed tobacco cessation the patient is not willing at this time. -Encouraged maintaining adequate hydration -At follow up can consider cardiology referral and/r starting beta blocker. -Followup in 2 weeks. Return precautions reviewed. -At follow up, can evaluate if patient has had formal workup for asthma. Will also need to ask patient about caffeine intake and any illicit drug use.

## 2014-01-26 ENCOUNTER — Encounter: Payer: Self-pay | Admitting: Family Medicine

## 2014-01-26 ENCOUNTER — Ambulatory Visit (INDEPENDENT_AMBULATORY_CARE_PROVIDER_SITE_OTHER): Payer: Medicare Other | Admitting: Family Medicine

## 2014-01-26 VITALS — BP 122/82 | HR 88 | Ht 66.0 in | Wt 182.0 lb

## 2014-01-26 DIAGNOSIS — F410 Panic disorder [episodic paroxysmal anxiety] without agoraphobia: Secondary | ICD-10-CM

## 2014-01-26 DIAGNOSIS — H6093 Unspecified otitis externa, bilateral: Secondary | ICD-10-CM

## 2014-01-26 DIAGNOSIS — H60399 Other infective otitis externa, unspecified ear: Secondary | ICD-10-CM

## 2014-01-26 NOTE — Assessment & Plan Note (Signed)
Slough improved, however overall not much improved. Afebrile with no mastoid tenderness. Chronic.  - Continue debrox. - Restart cortisporin. - Cleaned out today with L curette and bilateral washout

## 2014-01-26 NOTE — Patient Instructions (Addendum)
For your ears: - Continue debrox daily. This softens wax/dry particles. - Continue Cortisporin. This treats inflammation and any infection in the ear canal. - Seek immediate care if you have fevers, ear pain, or worsening symtoms or other concerns. Otherwise, follow up for this in 2 weeks.  For your anxiety: - Continue clozaril. Do not stop until discussing with psychiatrist. - Note to psychiatrist: Please help with guidance on any medication adjustment for her anxiety. She reports a panic component when in crowds. I am glad she is going to a panic attack group Tuesdays and want her to continue this. - I also would like her to have a 1:1 therapist if that is a possibility for her.  Please bring Marlowe Kays to your next appointment. Thank you Marlowe Kays in advance!  Continue thinking about quitting smoking. Let me know when you are ready to discuss this.  Hilton Sinclair, MD  Panic Attacks Panic attacks are sudden, short feelings of great fear or discomfort. You may have them for no reason when you are relaxed, when you are uneasy (anxious), or when you are sleeping.  HOME CARE  Take all your medicines as told.  Check with your doctor before starting new medicines.  Keep all doctor visits. GET HELP IF:  You are not able to take your medicines as told.  Your symptoms do not get better.  Your symptoms get worse. GET HELP RIGHT AWAY IF:  Your attacks seem different than your normal attacks.  You have thoughts about hurting yourself or others.  You take panic attack medicine and you have a side effect. MAKE SURE YOU:  Understand these instructions.  Will watch your condition.  Will get help right away if you are not doing well or get worse. Document Released: 07/26/2010 Document Revised: 04/13/2013 Document Reviewed: 02/04/2013 [facility] Patient Information 2015 [place], [place]. This information is not intended to replace advice given to you by your health care provider. Make  sure you discuss any questions you have with your health care provider.

## 2014-01-26 NOTE — Assessment & Plan Note (Signed)
Patient has q3 month appts with psychiatrist and goes to group therapy re: panic q Tuesday. Requesting to stop medication as she feels it contributes. - Discussed continuing medication and following up Monday with Psychiatrist at [facility] Pauline Good) as scheduled. - Will attempt to contact psychiatrist to find out if any medication would be more effective for patient's panic attacks. - Also requested therapist in [place] which I asked pt to show psychiatrist. Goal: Increased comfort crossing streets. - Requesting Almetta Lovely (group home admin) to accompany Jean Williams to f/u appt, which Izora Gala agrees with.

## 2014-01-26 NOTE — Progress Notes (Signed)
Patient ID: Jean Williams, female   DOB: 08/12/1960, 53 y.o.   MRN: [id number] Subjective:   CC: Follow up ear itching, follow up anxiety  HPI:   Follow up ear itching Jean Williams is a 53 y.o. female with a h/o schizophrenia and otitis externa here for follow up for ear itching. She denies fevers, chills, or ear pain. She does not use qtips but digs in ears with fingers. She uses her debrox but has not used vinegar solution. She states it feels a little better.  Follow up anxiety Patient continues reporting panic attacks in morning when she has to go catch bus, making her want to start taking taxis instead. She thinks this is due to her medication for schizophrenia and would like to discuss with me. She thought she had an appointment with her psychiatrist last week but it is actually this week. She currently takes her clozaril and paxil daily.   Collateral: Her group home manager Jean Williams confirms this over the phone. She also participates in a group therapy class about panic attacks. She does not have an individual therapist.    Review of Systems - Per HPI.  SH: Lives in a group home Smoking status: Current everyday smoker, not ready to discuss quitting    Objective:  Physical Exam BP 122/82  Pulse 88  Ht 5\' 6"  (1.676 m)  Wt 182 lb (82.555 kg)  BMI 29.39 kg/m2  LMP 06/10/2011 GEN: NAD HEENT: TMs bilateral moderate erythema and pearliness, left EAM coated with thin sloughed skin substance that is easily removed with curette. No tenderness. Right EAM clear. No mastoid process tenderness. PSYCH: Mood and affect anxious but euthymic    Assessment:     Jean Williams is a 53 y.o. female with h/o schizophrenia and otitis externa here for follow up.    Plan:     FOllow up otitis externa Slough improved, however overall not much improved. Afebrile with no mastoid tenderness. Chronic.  - Continue debrox. - Restart cortisporin. - Cleaned out today with L curette and bilateral  washout  Anxiety with panic attacks Patient has q3 month appts with psychiatrist and goes to group therapy re: panic q Tuesday. Requesting to stop medication as she feels it contributes. - Discussed continuing medication and following up Monday with Psychiatrist at [facility] Jean Williams) as scheduled. - Will attempt to contact psychiatrist to find out if any medication would be more effective for patient's panic attacks. - Also requested therapist in [place] which I asked pt to show psychiatrist. Goal: Increased comfort crossing streets. - Requesting Jean Williams (group home admin) to accompany Jean Williams to f/u appt, which Jean Williams agrees with.   Follow-up: Follow up in 2 weeks for f/u of ears and anxiety.   Hilton Sinclair, MD [place]

## 2014-02-07 ENCOUNTER — Encounter: Payer: Self-pay | Admitting: Family Medicine

## 2014-02-07 NOTE — Progress Notes (Signed)
Patient ID: Jean Williams, female   DOB: 1960-12-01, 53 y.o.   MRN: [id number]  Patient is on pepcid for dyspepsia. She is due for reassessment and possible dose reduction/discontinuation of pepcid.  Hilton Sinclair, MD

## 2014-02-21 ENCOUNTER — Ambulatory Visit: Payer: Medicare Other | Admitting: Family Medicine

## 2014-03-15 ENCOUNTER — Other Ambulatory Visit: Payer: Self-pay

## 2014-03-15 DIAGNOSIS — Z1231 Encounter for screening mammogram for malignant neoplasm of breast: Secondary | ICD-10-CM

## 2014-03-29 ENCOUNTER — Ambulatory Visit: Payer: Medicare Other

## 2014-03-31 ENCOUNTER — Ambulatory Visit: Payer: Medicare Other | Admitting: Family Medicine

## 2014-05-29 ENCOUNTER — Other Ambulatory Visit (HOSPITAL_COMMUNITY)
Admission: RE | Admit: 2014-05-29 | Discharge: 2014-05-29 | Disposition: A | Discharge status: Home / Self Care | Payer: Medicare Other | Source: Ambulatory Visit | Attending: Family Medicine | Admitting: Family Medicine

## 2014-05-29 ENCOUNTER — Ambulatory Visit (INDEPENDENT_AMBULATORY_CARE_PROVIDER_SITE_OTHER): Payer: Medicare Other | Admitting: *Deleted

## 2014-05-29 ENCOUNTER — Encounter: Payer: Self-pay | Admitting: Family Medicine

## 2014-05-29 ENCOUNTER — Ambulatory Visit (INDEPENDENT_AMBULATORY_CARE_PROVIDER_SITE_OTHER): Payer: Medicare Other | Admitting: Family Medicine

## 2014-05-29 VITALS — BP 142/94 | HR 129 | Temp 98.1°F | Ht 66.0 in | Wt 188.0 lb

## 2014-05-29 DIAGNOSIS — Z1151 Encounter for screening for human papillomavirus (HPV): Secondary | ICD-10-CM | POA: Diagnosis present

## 2014-05-29 DIAGNOSIS — Z124 Encounter for screening for malignant neoplasm of cervix: Secondary | ICD-10-CM | POA: Insufficient documentation

## 2014-05-29 DIAGNOSIS — Z202 Contact with and (suspected) exposure to infections with a predominantly sexual mode of transmission: Secondary | ICD-10-CM

## 2014-05-29 DIAGNOSIS — R7309 Other abnormal glucose: Secondary | ICD-10-CM

## 2014-05-29 DIAGNOSIS — Z113 Encounter for screening for infections with a predominantly sexual mode of transmission: Secondary | ICD-10-CM | POA: Insufficient documentation

## 2014-05-29 DIAGNOSIS — Z23 Encounter for immunization: Secondary | ICD-10-CM

## 2014-05-29 DIAGNOSIS — Z20828 Contact with and (suspected) exposure to other viral communicable diseases: Secondary | ICD-10-CM

## 2014-05-29 DIAGNOSIS — E782 Mixed hyperlipidemia: Secondary | ICD-10-CM

## 2014-05-29 DIAGNOSIS — Z0000 Encounter for general adult medical examination without abnormal findings: Secondary | ICD-10-CM | POA: Insufficient documentation

## 2014-05-29 LAB — LIPID PANEL
Cholesterol: 203 mg/dL — ABNORMAL HIGH (ref 0–200)
HDL: 40 mg/dL (ref >39)
Total CHOL/HDL Ratio: 5.1 Ratio
Triglycerides: 603 mg/dL — ABNORMAL HIGH (ref <150)

## 2014-05-29 LAB — POCT GLYCOSYLATED HEMOGLOBIN (HGB A1C): Hemoglobin A1C: 6

## 2014-05-29 NOTE — Assessment & Plan Note (Signed)
-   No colonoscopy as pt thinks she had this 2 years ago and normal. - Call to set up mammogram. - A1c today>>6. Prediabetes. Sent letter recommending diet/exercise and recheck 6 mo. - F/u BP next visit. - HIV/RPR, gonorrhea and chlamydia tested today - Pap smear performed. Difficult cervix as stenosis and irregularity of cervix present. Await pap but may recommend colpo either way. - Lipid panel today - Have recommended cutting back on tobacco, pt resistant - Flu shot administered. Ask about TDAP and pneumonia shot at f/u - Brush teeth 2x daily and dentist q6 mo. - Continue regular psychiatric visits.

## 2014-05-29 NOTE — Progress Notes (Signed)
Patient ID: Jean Williams, female   DOB: 1961-01-21, 53 y.o.   MRN: [id number] Subjective:   CC: Well woman visit  HPI:   As of 05/29/2014 :  Colonoscopy: Pt thinks she had this 2 years ago and it was normal. Unable to find record of this.   Mammogram: Last documented 01/2011 negative recommending f/u in 1 year; never abnormal per patient.  A1c never checked. Pt on clozapine and h/o abnormal BG.  BP: Mildly elevated at 140s/90s today.  STD check: Denies sexual activity, abnormal vaginal discharge, or pelvic pain but amenable to test. No HIV check in system.  Pap smears: 03/2009 is last in chart. Pt thinks she had tissue removed for abnormal pap in the past.  Lipids not checked since 2011.   Smoking status: Every day smoker  TDAP, Zostavax, pneumococcal, influenza:  Dental: brushes teeth in AM only.  Review of weight: Stable in mid-high 180s since May. LMP: 2 years ago  Review of Systems - Per HPI.     Objective:  Physical Exam BP 142/94 mmHg  Pulse 129  Temp(Src) 98.1 F (36.7 C) (Oral)  Ht 5\' 6"  (1.676 m)  Wt 188 lb (85.276 kg)  BMI 30.36 kg/m2  LMP 06/10/2011 GEN: NAD CV: regular rhythm, mild tachycardia 110s, no m/r/g PULM: CTAB, normal effort ABD: S/NT/ND EXTR: No LE edema or calf tenderness SKIN: Right anterior palm with 2cm patch of erythematous skin with no purulence, tenderness, or warmth; Face erythematous and thickened GU: Mild hyperpigmentation lower right labia minora with firmness; otherwise normal vaginal introitus and mucosa; Cervix visualized, stenotic, Slight irregular feeling. Thin white discharge. No CMT or uterine/adnexal tenderness with normal size and mobility.    Assessment:     Jean Williams is a 53 y.o. female here for well woman visit.    Plan:     Follow-up: Follow up in 1 year for next well woman visit. Needs sooner f/u for tachycardia.    Hilton Sinclair, MD [place]

## 2014-05-29 NOTE — Patient Instructions (Signed)
You are due for a mammogram. We are getting labs and i will call if any are NOT normal. Follow up regularly with your psychiatrist.  Keep trying to cut back on smoking. You got a flu shot today. Brush at night as well as in AM.  Best,  Hilton Sinclair, MD  Health Maintenance Adopting a healthy lifestyle and getting preventive care can go a long way to promote health and wellness. Talk with your health care provider about what schedule of regular examinations is right for you. This is a good chance for you to check in with your provider about disease prevention and staying healthy. In between checkups, there are plenty of things you can do on your own. Experts have done a lot of research about which lifestyle changes and preventive measures are most likely to keep you healthy. Ask your health care provider for more information. WEIGHT AND DIET  Eat a healthy diet  Be sure to include plenty of vegetables, fruits, low-fat dairy products, and lean protein.  Do not eat a lot of foods high in solid fats, added sugars, or salt.  Get regular exercise. This is one of the most important things you can do for your health.  Most adults should exercise for at least 150 minutes each week. The exercise should increase your heart rate and make you sweat (moderate-intensity exercise).  Most adults should also do strengthening exercises at least twice a week. This is in addition to the moderate-intensity exercise.  Maintain a healthy weight  Body mass index (BMI) is a measurement that can be used to identify possible weight problems. It estimates body fat based on height and weight. Your health care provider can help determine your BMI and help you achieve or maintain a healthy weight.  For females 59 years of age and older:   A BMI below 18.5 is considered underweight.  A BMI of 18.5 to 24.9 is normal.  A BMI of 25 to 29.9 is considered overweight.  A BMI of 30 and above is considered  obese.  Watch levels of cholesterol and blood lipids  You should start having your blood tested for lipids and cholesterol at 54 years of age, then have this test every 5 years.  You may need to have your cholesterol levels checked more often if:  Your lipid or cholesterol levels are high.  You are older than 54 years of age.  You are at high risk for heart disease.  CANCER SCREENING   Lung Cancer  Lung cancer screening is recommended for adults 62-56 years old who are at high risk for lung cancer because of a history of smoking.  A yearly low-dose CT scan of the lungs is recommended for people who:  Currently smoke.  Have quit within the past 15 years.  Have at least a 30-pack-year history of smoking. A pack year is smoking an average of one pack of cigarettes a day for 1 year.  Yearly screening should continue until it has been 15 years since you quit.  Yearly screening should stop if you develop a health problem that would prevent you from having lung cancer treatment.  Breast Cancer  Practice breast self-awareness. This means understanding how your breasts normally appear and feel.  It also means doing regular breast self-exams. Let your health care provider know about any changes, no matter how small.  If you are in your 20s or 30s, you should have a clinical breast exam (CBE) by a health care  provider every 1-3 years as part of a regular health exam.  If you are 57 or older, have a CBE every year. Also consider having a breast X-ray (mammogram) every year.  If you have a family history of breast cancer, talk to your health care provider about genetic screening.  If you are at high risk for breast cancer, talk to your health care provider about having an MRI and a mammogram every year.  Breast cancer gene (BRCA) assessment is recommended for women who have family members with BRCA-related cancers. BRCA-related cancers  include:  Breast.  Ovarian.  Tubal.  Peritoneal cancers.  Results of the assessment will determine the need for genetic counseling and BRCA1 and BRCA2 testing. Cervical Cancer Routine pelvic examinations to screen for cervical cancer are no longer recommended for nonpregnant women who are considered low risk for cancer of the pelvic organs (ovaries, uterus, and vagina) and who do not have symptoms. A pelvic examination may be necessary if you have symptoms including those associated with pelvic infections. Ask your health care provider if a screening pelvic exam is right for you.   The Pap test is the screening test for cervical cancer for women who are considered at risk.  If you had a hysterectomy for a problem that was not cancer or a condition that could lead to cancer, then you no longer need Pap tests.  If you are older than 65 years, and you have had normal Pap tests for the past 10 years, you no longer need to have Pap tests.  If you have had past treatment for cervical cancer or a condition that could lead to cancer, you need Pap tests and screening for cancer for at least 20 years after your treatment.  If you no longer get a Pap test, assess your risk factors if they change (such as having a new sexual partner). This can affect whether you should start being screened again.  Some women have medical problems that increase their chance of getting cervical cancer. If this is the case for you, your health care provider may recommend more frequent screening and Pap tests.  The human papillomavirus (HPV) test is another test that may be used for cervical cancer screening. The HPV test looks for the virus that can cause cell changes in the cervix. The cells collected during the Pap test can be tested for HPV.  The HPV test can be used to screen women 24 years of age and older. Getting tested for HPV can extend the interval between normal Pap tests from three to five years.  An HPV  test also should be used to screen women of any age who have unclear Pap test results.  After 54 years of age, women should have HPV testing as often as Pap tests.  Colorectal Cancer  This type of cancer can be detected and often prevented.  Routine colorectal cancer screening usually begins at 54 years of age and continues through 54 years of age.  Your health care provider may recommend screening at an earlier age if you have risk factors for colon cancer.  Your health care provider may also recommend using home test kits to check for hidden blood in the stool.  A small camera at the end of a tube can be used to examine your colon directly (sigmoidoscopy or colonoscopy). This is done to check for the earliest forms of colorectal cancer.  Routine screening usually begins at age 22.  Direct examination of the colon  should be repeated every 5-10 years through 54 years of age. However, you may need to be screened more often if early forms of precancerous polyps or small growths are found. Skin Cancer  Check your skin from head to toe regularly.  Tell your health care provider about any new moles or changes in moles, especially if there is a change in a mole's shape or color.  Also tell your health care provider if you have a mole that is larger than the size of a pencil eraser.  Always use sunscreen. Apply sunscreen liberally and repeatedly throughout the day.  Protect yourself by wearing long sleeves, pants, a wide-brimmed hat, and sunglasses whenever you are outside. HEART DISEASE, DIABETES, AND HIGH BLOOD PRESSURE   Have your blood pressure checked at least every 1-2 years. High blood pressure causes heart disease and increases the risk of stroke.  If you are between 55 years and 79 years old, ask your health care provider if you should take aspirin to prevent strokes.  Have regular diabetes screenings. This involves taking a blood sample to check your fasting blood sugar  level.  If you are at a normal weight and have a low risk for diabetes, have this test once every three years after 54 years of age.  If you are overweight and have a high risk for diabetes, consider being tested at a younger age or more often. PREVENTING INFECTION  Hepatitis B  If you have a higher risk for hepatitis B, you should be screened for this virus. You are considered at high risk for hepatitis B if:  You were born in a country where hepatitis B is common. Ask your health care provider which countries are considered high risk.  Your parents were born in a high-risk country, and you have not been immunized against hepatitis B (hepatitis B vaccine).  You have HIV or AIDS.  You use needles to inject street drugs.  You live with someone who has hepatitis B.  You have had sex with someone who has hepatitis B.  You get hemodialysis treatment.  You take certain medicines for conditions, including cancer, organ transplantation, and autoimmune conditions. Hepatitis C  Blood testing is recommended for:  Everyone born from 1945 through 1965.  Anyone with known risk factors for hepatitis C. Sexually transmitted infections (STIs)  You should be screened for sexually transmitted infections (STIs) including gonorrhea and chlamydia if:  You are sexually active and are younger than 54 years of age.  You are older than 54 years of age and your health care provider tells you that you are at risk for this type of infection.  Your sexual activity has changed since you were last screened and you are at an increased risk for chlamydia or gonorrhea. Ask your health care provider if you are at risk.  If you do not have HIV, but are at risk, it may be recommended that you take a prescription medicine daily to prevent HIV infection. This is called pre-exposure prophylaxis (PrEP). You are considered at risk if:  You are sexually active and do not regularly use condoms or know the HIV status  of your partner(s).  You take drugs by injection.  You are sexually active with a partner who has HIV. Talk with your health care provider about whether you are at high risk of being infected with HIV. If you choose to begin PrEP, you should first be tested for HIV. You should then be tested every 3 months for   as long as you are taking PrEP.  PREGNANCY   If you are premenopausal and you may become pregnant, ask your health care provider about preconception counseling.  If you may become pregnant, take 400 to 800 micrograms (mcg) of folic acid every day.  If you want to prevent pregnancy, talk to your health care provider about birth control (contraception). OSTEOPOROSIS AND MENOPAUSE   Osteoporosis is a disease in which the bones lose minerals and strength with aging. This can result in serious bone fractures. Your risk for osteoporosis can be identified using a bone density scan.  If you are 65 years of age or older, or if you are at risk for osteoporosis and fractures, ask your health care provider if you should be screened.  Ask your health care provider whether you should take a calcium or vitamin D supplement to lower your risk for osteoporosis.  Menopause may have certain physical symptoms and risks.  Hormone replacement therapy may reduce some of these symptoms and risks. Talk to your health care provider about whether hormone replacement therapy is right for you.  HOME CARE INSTRUCTIONS   Schedule regular health, dental, and eye exams.  Stay current with your immunizations.   Do not use any tobacco products including cigarettes, chewing tobacco, or electronic cigarettes.  If you are pregnant, do not drink alcohol.  If you are breastfeeding, limit how much and how often you drink alcohol.  Limit alcohol intake to no more than 1 drink per day for nonpregnant women. One drink equals 12 ounces of beer, 5 ounces of wine, or 1 ounces of hard liquor.  Do not use street  drugs.  Do not share needles.  Ask your health care provider for help if you need support or information about quitting drugs.  Tell your health care provider if you often feel depressed.  Tell your health care provider if you have ever been abused or do not feel safe at home. Document Released: 01/06/2011 Document Revised: 11/07/2013 Document Reviewed: 05/25/2013 Exit[employer] Patient Information 2015 [employer]. This information is not intended to replace advice given to you by your health care provider. Make sure you discuss any questions you have with your health care provider.  

## 2014-05-30 LAB — RPR

## 2014-05-30 LAB — HIV ANTIBODY (ROUTINE TESTING W REFLEX): HIV: NONREACTIVE

## 2014-05-31 LAB — CERVICOVAGINAL ANCILLARY ONLY
CHLAMYDIA, DNA PROBE: NEGATIVE
Neisseria Gonorrhea: NEGATIVE

## 2014-06-05 LAB — CYTOLOGY - PAP

## 2014-07-03 ENCOUNTER — Telehealth: Payer: Self-pay | Admitting: Family Medicine

## 2014-07-03 NOTE — Telephone Encounter (Signed)
Please call to let patient know that labs from our last visit showed normal pap smear and we can continue routine screening.  Also let her know she may benefit from higher-intensity cholesterol medicine (atorvastatin or rosouvastatin). I can change these if she is amenable. I would also like to check direct LDL at next visit, and her triglycerides were high so i would like her to watch her sugary foods/drinks intake.  Finally, HIV, RPR, GC/chlamydia were all normal.  Notes to provider:  Pap smear: Normal and neg for high risk HPV but transformation zone absent (inadequate sample). Nevertheless, guidelines state to continue routine screening.  Lipid panel: High triglycerides and unable to measure LDL. When she follows up next, I would like to obtain direct LDL value. After this, we can decide on medication specifically for trilgycerides. In the meantime, decrease sweets. 10 year ASCVD risk is 7.7%, suggesting moderate or high intensity statin. She is on moderate (simvastatin 20mg  daily) and we will discuss changing to high intensity. At last visit, she was not ready for smoking cessation. Will continue tio discuss.   Hilton Sinclair, MD

## 2014-07-04 MED ORDER — ATORVASTATIN CALCIUM 40 MG PO TABS: 40.0000 mg | ORAL_TABLET | Freq: Every day | ORAL | Status: DC

## 2014-07-04 NOTE — Telephone Encounter (Signed)
I am covering for Dr. Dianah Field who is away from the office.  Will rx lipitor 40mg  daily. Please inform patient that she should stop simvastatin 20mg  and start atorvastatin 40mg  daily.  Leeanne Rio, MD

## 2014-07-04 NOTE — Addendum Note (Signed)
Addended by: Leeanne Rio on: 07/04/2014 07:24 PM   Modules accepted: Orders, Medications

## 2014-07-04 NOTE — Telephone Encounter (Signed)
Pt informed and agreeable to medication increase.  Will forward to MD Fleeger, Salome Spotted

## 2014-07-05 NOTE — Telephone Encounter (Signed)
Pt informed and also spoke with RN at her group home and informed of below info.  Will fax over this phone note to [phone] as an order to change medication. Emma Schupp, Salome Spotted

## 2014-07-14 ENCOUNTER — Ambulatory Visit (INDEPENDENT_AMBULATORY_CARE_PROVIDER_SITE_OTHER): Payer: Medicare Other | Admitting: Family Medicine

## 2014-07-14 ENCOUNTER — Encounter: Payer: Self-pay | Admitting: Family Medicine

## 2014-07-14 VITALS — BP 155/103 | HR 106 | Temp 97.8°F | Ht 66.0 in | Wt 189.9 lb

## 2014-07-14 DIAGNOSIS — F209 Schizophrenia, unspecified: Secondary | ICD-10-CM

## 2014-07-14 NOTE — Patient Instructions (Signed)
We filled out your forms today.  I recommend going to anxiety classes.  Come back to see me to follow up on other issues.  Best,  Hilton Sinclair, MD

## 2014-07-18 NOTE — Progress Notes (Signed)
Patient ID: Jean Williams, female   DOB: [birth year], 54 y.o.   MRN: [id number] Subjective:   CC: Paperwork  HPI:   Jean Williams is a 54 y.o. female with h/o schizophrenia panic d/o with agoraphobia here with paperwork for recent panic attack. She states she has been having panic attacks and has been out of work since 12/2. Things got particularly worse per pt because she missed her [facility] dr appt in December due to the doctor having a conflict. She saw them 1 week ago and they discussed starting anxiety medication PRN that has been helping some, and starting Tuesday weekly anxiety classes. Wishes to go back to work January. Still not back to normal - anxiety makes it hard to enter places - but feels lots better and was able to come into clinic today.  Review of Systems - Per HPI.  Smoking status: current every day smoker    Objective:  Physical Exam BP 155/103 mmHg  Pulse 106  Temp(Src) 97.8 F (36.6 C) (Oral)  Ht 5\' 6"  (1.676 m)  Wt 189 lb 14.4 oz (86.138 kg)  BMI 30.67 kg/m2  LMP 06/10/2011 GEN: NAD PSYCH: Mood and affect anxious but interacts appropriately, friendly NEURO: Awake, alert, no focal deficits     Assessment:     Jean Williams is a 54 y.o. female here for paperwork for missing work.    Plan:     # See problem list and after visit summary for problem-specific plans.  Follow-up: Follow up PRN. - Recheck BP at next visit - likely elevated due in part to anxiety.   Of note, patient was late but we worked her in at end of clinic.  Hilton Sinclair, MD [place]

## 2014-07-18 NOTE — Assessment & Plan Note (Signed)
Recent panic attacks, unclear trigger - ?missed [facility] appt vs holidays vs other? Forms for leave filled out. Recommended anxiety classes. ROI for last few appts at [facility] to know which medications they have started.

## 2014-09-15 ENCOUNTER — Telehealth: Payer: Self-pay | Admitting: Family Medicine

## 2014-09-15 NOTE — Telephone Encounter (Signed)
Form placed in provider's box.  Jean Williams,CMA  

## 2014-09-15 NOTE — Telephone Encounter (Signed)
Jean Williams dropped Forms to be signed by PCP, and mailed to [facility].

## 2014-09-22 ENCOUNTER — Other Ambulatory Visit: Payer: Self-pay | Admitting: Family Medicine

## 2014-09-22 NOTE — Telephone Encounter (Signed)
Forms placed in mail per request to [facility] [address] [place], [place] [address]. Derl Barrow, RN

## 2014-11-06 ENCOUNTER — Encounter: Payer: Self-pay | Admitting: Family Medicine

## 2014-11-06 ENCOUNTER — Ambulatory Visit (INDEPENDENT_AMBULATORY_CARE_PROVIDER_SITE_OTHER): Payer: Self-pay | Admitting: Family Medicine

## 2014-11-06 VITALS — BP 117/84 | HR 116 | Temp 97.6°F | Ht 66.0 in | Wt 173.5 lb

## 2014-11-06 DIAGNOSIS — M79644 Pain in right finger(s): Secondary | ICD-10-CM

## 2014-11-06 MED ORDER — DOXYCYCLINE HYCLATE 100 MG PO TABS: 100.0000 mg | ORAL_TABLET | Freq: Two times a day (BID) | ORAL | Status: DC

## 2014-11-06 NOTE — Assessment & Plan Note (Addendum)
New problem. Unclear etiology. Given erythema and swelling as well as the lesion and as noted under the volar side of the DIP joint, there is concern for underlying infection. I'm starting the patient on doxycycline today. Additionally, given the unclear nature of this problem there is also a potential for underlying fracture.  X-ray ordered today. Recommended follow up later this week for re-check.

## 2014-11-06 NOTE — Progress Notes (Signed)
   Subjective:    Patient ID: Jean Williams, female    DOB: 10-13-1960, 54 y.o.   MRN: [id number]  HPI 54 year old female with a history of schizophrenia presents for same day appointment with a chief complaint of finger pain.  1) Finger pain  Patient reports that she developed pain of her right index finger yesterday.  She does not recall any fall, trauma, injury.  Pain is currently moderate in severity.  She reports that yesterday she had difficulty moving her finger.  This has subsequently improved.  No exacerbating or relieving factors. No treatments tried.  No associated fevers or chills.    Social Hx - current everyday smoker.   Review of Systems  Constitutional: Negative for fever and chills.  Musculoskeletal: Positive for arthralgias.  Skin: Negative for rash and wound.      Objective:   Physical Exam Filed Vitals:   11/06/14 1538  BP: 117/84  Pulse: 116  Temp: 97.6 F (36.4 C)   Vital signs reviewed.  Exam: General: well appearing, NAD. Extremities: R index finger: Erythema and swelling noted from the PIP to the DIP.  No wound or evidence of penetrating trauma. Patient does have flexion at the DIP joint.  On the volar side at the DIP there is a small white lesion underneath the skin.  Hand with 2+ radial pulses. Sensation intact.     Assessment & Plan:  See Problem List

## 2014-11-06 NOTE — Patient Instructions (Signed)
It was nice to see you today.  The concern is that you have an underlying infection or fracture.  Please take the antibiotic as prescribed.  Please going get an x-ray.  We will call you with the results  Follow-up later this week (2 days to 1 week).

## 2014-11-27 ENCOUNTER — Other Ambulatory Visit: Payer: Self-pay | Admitting: *Deleted

## 2014-11-27 DIAGNOSIS — Z0000 Encounter for general adult medical examination without abnormal findings: Secondary | ICD-10-CM

## 2014-11-27 NOTE — Telephone Encounter (Signed)
Received refill request for Buspirone HCL 10 mg tablet. Medication is not listed on medication list.  Derl Barrow, RN

## 2014-11-30 MED ORDER — ATORVASTATIN CALCIUM 40 MG PO TABS: 40.0000 mg | ORAL_TABLET | Freq: Every day | ORAL | Status: DC

## 2014-11-30 MED ORDER — SENTRY PO TABS: 1.0000 | ORAL_TABLET | Freq: Every day | ORAL | Status: DC

## 2014-11-30 MED ORDER — PAROXETINE HCL 20 MG PO TABS: 20.0000 mg | ORAL_TABLET | Freq: Every day | ORAL | Status: DC

## 2014-11-30 MED ORDER — LORATADINE 10 MG PO TABS: 10.0000 mg | ORAL_TABLET | Freq: Every day | ORAL | Status: DC

## 2014-11-30 MED ORDER — CARBAMIDE PEROXIDE 6.5 % OT SOLN: 10.0000 [drp] | Freq: Two times a day (BID) | OTIC | Status: DC

## 2014-11-30 MED ORDER — DOCUSATE SODIUM 100 MG PO CAPS: 100.0000 mg | ORAL_CAPSULE | Freq: Two times a day (BID) | ORAL | Status: DC

## 2014-11-30 MED ORDER — REFRESH P.M. OP OINT: TOPICAL_OINTMENT | OPHTHALMIC | Status: DC | PRN

## 2014-11-30 MED ORDER — CALCIUM CARBONATE-VITAMIN D 500-200 MG-UNIT PO TABS: 1.0000 | ORAL_TABLET | Freq: Two times a day (BID) | ORAL | Status: DC

## 2014-11-30 MED ORDER — FAMOTIDINE 40 MG PO TABS: ORAL_TABLET | ORAL | Status: DC

## 2014-11-30 MED ORDER — FLUTICASONE PROPIONATE HFA 110 MCG/ACT IN AERO: INHALATION_SPRAY | RESPIRATORY_TRACT | Status: DC

## 2014-11-30 NOTE — Telephone Encounter (Signed)
I am approving these 10 rx's, but please ask pt to contact psychiatrist about buspirone since it is not on our medication list. Psychiatrist should also refill future orders for paxil.  Hilton Sinclair, MD

## 2014-12-01 NOTE — Telephone Encounter (Signed)
LM for patient to call back. Jean Williams,CMA  

## 2015-02-16 ENCOUNTER — Encounter: Payer: Self-pay | Admitting: Obstetrics and Gynecology

## 2015-02-16 ENCOUNTER — Ambulatory Visit (INDEPENDENT_AMBULATORY_CARE_PROVIDER_SITE_OTHER): Payer: Medicare Other | Admitting: Obstetrics and Gynecology

## 2015-02-16 VITALS — BP 129/82 | HR 115 | Temp 98.2°F | Wt 178.0 lb

## 2015-02-16 DIAGNOSIS — H6123 Impacted cerumen, bilateral: Secondary | ICD-10-CM

## 2015-02-16 NOTE — Patient Instructions (Addendum)
Here are some of the things we discussed today: -No signs of ear infection currently -clean out your ears with the ear was drops but you need to get a bottle to expel the wax -Look at your pharmacy for ear was removal kit -we also discussed daily cleaning of ear with warm water and rag -Come back in to discuss sugars with your PCP Dr. Brett Albino  Thanks for allowing me to be a part of your care! Dr. Gerarda Fraction

## 2015-02-16 NOTE — Progress Notes (Signed)
   Subjective:   Patient ID: Jean Williams, female    DOB: [birth year], 54 y.o.   MRN: [id number]  Patient presents for Same Day Appointment  CC: Ear Pain  HPI: #EAR PAIN Location: Both ears but R>L Ear pain started: unknown, constant for years Pain: No pain, just feels uncomfortable She believes she has an ear infection States she has decreased hearing as well Patient admits to picking in her ears alot Medications tried: ear wax eardrops Recent ear trauma: no Prior ear surgeries: no Antibiotics in the last 30 days: no  Last time occurred she was put on ear drops  Symptoms Ear discharge: ?yes Fever: no Pain with chewing: no Ringing in ears: no Dizziness: no Hearing loss: yes Rashes or blisters around ear: no Weight loss: no  Review of Symptoms - see HPI PMH - Smoking status noted.    Past medical history, surgical, family, and social history reviewed and updated in the EMR as appropriate.  Objective:  BP 129/82 mmHg  Pulse 115  Temp(Src) 98.2 F (36.8 C) (Oral)  Wt 178 lb (80.74 kg)  LMP 06/10/2011  Physical Exam General: alert, well-appearing, NAD Ears: External ear exam reveals no significant deformities. She does have scars and dried skin around both external ears from picking. Canals blocked with wax.TMs unable to be visualized.  Psych: Tangential speech, anxious.  Ear clean out performed --> after cleaning TM visualized and normal and canal clear and normal.    Assessment & Plan:  Impacted ear wax causing bilateral ear discomfort and decreased hearing. Patient with improvement of symptoms s/p cleaning of ears. No signs of infection at this time -conservative management  -discussed with patient good ear hygiene -handout given -return precautions discussed  Luiz Blare, DO 02/16/2015, 3:51 PM PGY-2, [place]

## 2015-04-18 ENCOUNTER — Other Ambulatory Visit: Payer: Self-pay | Admitting: *Deleted

## 2015-04-19 MED ORDER — CALCIUM CARBONATE-VITAMIN D 500-200 MG-UNIT PO TABS: 1.0000 | ORAL_TABLET | Freq: Two times a day (BID) | ORAL | Status: DC

## 2015-04-19 NOTE — Telephone Encounter (Signed)
appt made for 05/26/15 due to patient not able to come in until after 3pm.  Jmarion Christiano,CMA

## 2015-04-19 NOTE — Telephone Encounter (Signed)
Please let Jean Williams know that I have refilled her prescription. She is overdue for a regular follow-up so please let her know that she should schedule an appointment. Thank you!

## 2015-04-23 ENCOUNTER — Ambulatory Visit (INDEPENDENT_AMBULATORY_CARE_PROVIDER_SITE_OTHER): Payer: Medicare Other | Admitting: *Deleted

## 2015-04-23 DIAGNOSIS — Z23 Encounter for immunization: Secondary | ICD-10-CM | POA: Diagnosis not present

## 2015-05-16 ENCOUNTER — Encounter: Payer: Self-pay | Admitting: Internal Medicine

## 2015-05-16 ENCOUNTER — Ambulatory Visit (INDEPENDENT_AMBULATORY_CARE_PROVIDER_SITE_OTHER): Payer: Medicare Other | Admitting: Internal Medicine

## 2015-05-16 ENCOUNTER — Other Ambulatory Visit (INDEPENDENT_AMBULATORY_CARE_PROVIDER_SITE_OTHER): Payer: Medicare Other

## 2015-05-16 VITALS — Ht 66.0 in | Wt 178.0 lb

## 2015-05-16 DIAGNOSIS — R631 Polydipsia: Secondary | ICD-10-CM

## 2015-05-16 LAB — GLUCOSE, CAPILLARY: Glucose-Capillary: 79 mg/dL (ref 65–99)

## 2015-05-16 NOTE — Progress Notes (Signed)
Patient had an office visit with Dr Brett Albino but decided to cancel after her CBG was already done. Result dropped in chart.

## 2015-05-24 ENCOUNTER — Ambulatory Visit: Payer: Medicare Other | Admitting: Internal Medicine

## 2015-06-08 ENCOUNTER — Encounter: Payer: Self-pay | Admitting: Internal Medicine

## 2015-06-08 ENCOUNTER — Ambulatory Visit (INDEPENDENT_AMBULATORY_CARE_PROVIDER_SITE_OTHER): Payer: Medicare Other | Admitting: Internal Medicine

## 2015-06-08 VITALS — BP 107/89 | HR 125 | Temp 98.2°F | Ht 66.0 in | Wt 178.5 lb

## 2015-06-08 DIAGNOSIS — F419 Anxiety disorder, unspecified: Secondary | ICD-10-CM | POA: Diagnosis not present

## 2015-06-08 DIAGNOSIS — Z0000 Encounter for general adult medical examination without abnormal findings: Secondary | ICD-10-CM

## 2015-06-08 NOTE — Progress Notes (Signed)
Subjective:     Jean Williams is a 54 y.o. female and is here for a comprehensive physical exam. The patient reports no problems.  Social History   Social History  . Marital Status: Single    Spouse Name: N/A  . Number of Children: N/A  . Years of Education: N/A   Occupational History  . Not on file.   Social History Main Topics  . Smoking status: Current Every Day Smoker -- 1.50 packs/day    Types: Cigarettes  . Smokeless tobacco: Not on file  . Alcohol Use: Not on file  . Drug Use: Not on file  . Sexual Activity: Not on file   Other Topics Concern  . Not on file   Social History Narrative   Health Maintenance  Topic Date Due  . Hepatitis C Screening  1961-03-02  . COLONOSCOPY  05/24/2010  . MAMMOGRAM  01/29/2013  . TETANUS/TDAP  01/11/2016  . INFLUENZA VACCINE  02/05/2016  . PAP SMEAR  05/29/2017  . HIV Screening  Completed    The following portions of the patient's history were reviewed and updated as appropriate: allergies, current medications, past family history, past medical history, past social history, past surgical history and problem list.  Review of Systems Constitutional: negative Eyes: negative for visual disturbance Respiratory: positive for cough Cardiovascular: positive for palpitations Gastrointestinal: negative Integument/breast: negative for breast lump Hematologic/lymphatic: positive for bleeding and easy bruising, negative for lymphadenopathy Musculoskeletal:negative for neck pain Neurological: negative for gait problems and speech problems Endocrine: negative for temperature intolerance   Objective:    BP 107/89 mmHg  Pulse 125  Temp(Src) 98.2 F (36.8 C) (Oral)  Ht 5\' 6"  (1.676 m)  Wt 178 lb 8 oz (80.967 kg)  BMI 28.82 kg/m2  LMP 06/10/2011 Gen: Appears agitated, talking fast, rocking back and forth, occasionally scratching arms and legs.  HEENT: NCAT, EOMI, PERRL, MMM Neck: FROM, supple CV: RRR, good S1/S2, no murmur, 2+ DP  pulses Resp: Inspiratory and expiratory wheezes auscultated in all lung fields, normal work of breathing GI: SNTND, BS present, no guarding or organomegaly Msk: No edema, warm, normal tone, moves UE/LE spontaneously Neuro: Alert and oriented; 5/5 muscle strength in the upper and lower extremities bilaterally; normal sensation throughout Skin: no rashes, no lesions Psych: Somewhat pressured speech, appears agitated   Assessment:    Mostly female exam.      Plan:  1. Tobacco cessation education: Currently smoking 1 pack per day. Pre-contemplative.  - Encouraged Pt to stop smoking, stating that it is affecting her lungs. - Not interested in talking about it further.  2. Health Maintenance - Pt information about number to call to have her mammogram done. She is overdue. - Will get a Hepatitis C screening and lipid panel today.  3. Psychiatric issues. Hx of schizophrenia, panic attacks, anxiety. Today, she is visibly agitated with pressured speech. - Takes Clozaril, Buspar, and Paxil - Advised Pt to follow-up with psychiatrist

## 2015-06-08 NOTE — Patient Instructions (Signed)
It was so nice to meet you!  Please call the breast imaging center to make an appointment for a mammogram. Please come back when you have not eaten to have a lipid panel and hepatitis C screening.

## 2015-06-12 ENCOUNTER — Other Ambulatory Visit: Payer: Self-pay

## 2015-06-12 DIAGNOSIS — Z1231 Encounter for screening mammogram for malignant neoplasm of breast: Secondary | ICD-10-CM

## 2015-06-17 NOTE — Progress Notes (Signed)
Patient left because her ride was here and she could not wait to be seen by me.  Hyman Bible, MD

## 2015-07-12 ENCOUNTER — Ambulatory Visit
Admission: RE | Admit: 2015-07-12 | Discharge: 2015-07-12 | Disposition: A | Discharge status: Home / Self Care | Payer: Medicare Other | Source: Ambulatory Visit

## 2015-07-12 DIAGNOSIS — Z1231 Encounter for screening mammogram for malignant neoplasm of breast: Secondary | ICD-10-CM

## 2015-07-12 IMAGING — MG MM SCREENING BREAST TOMO BILATERAL
9 series · 9 of 25 positions shown · non-contrast
Comparison: Previous exam(s).

CLINICAL DATA: Screening.

EXAM:
DIGITAL SCREENING BILATERAL MAMMOGRAM WITH 3D TOMO WITH CAD

[R MLO (1 of 2)]
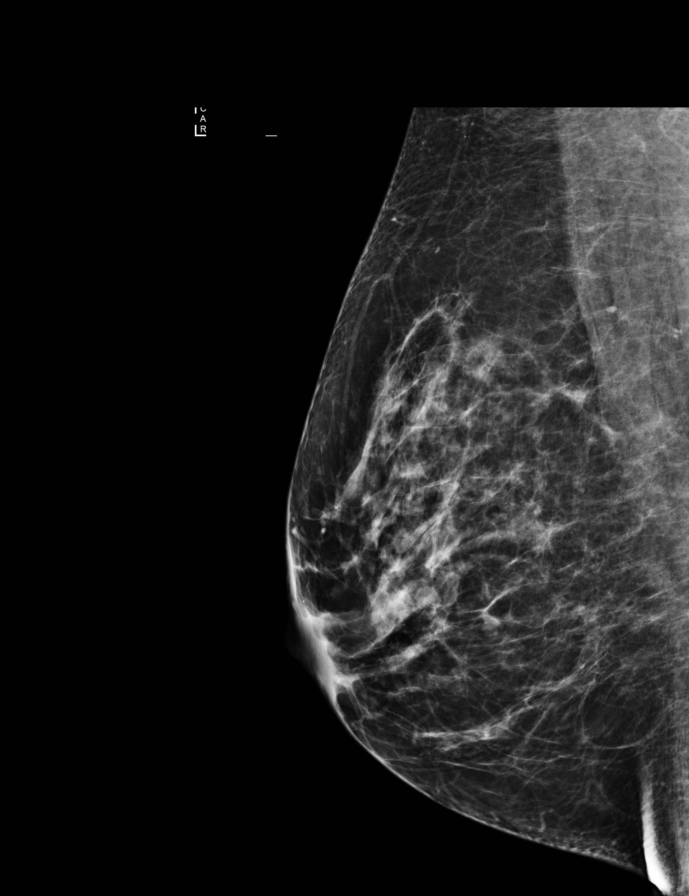

[L MLO]
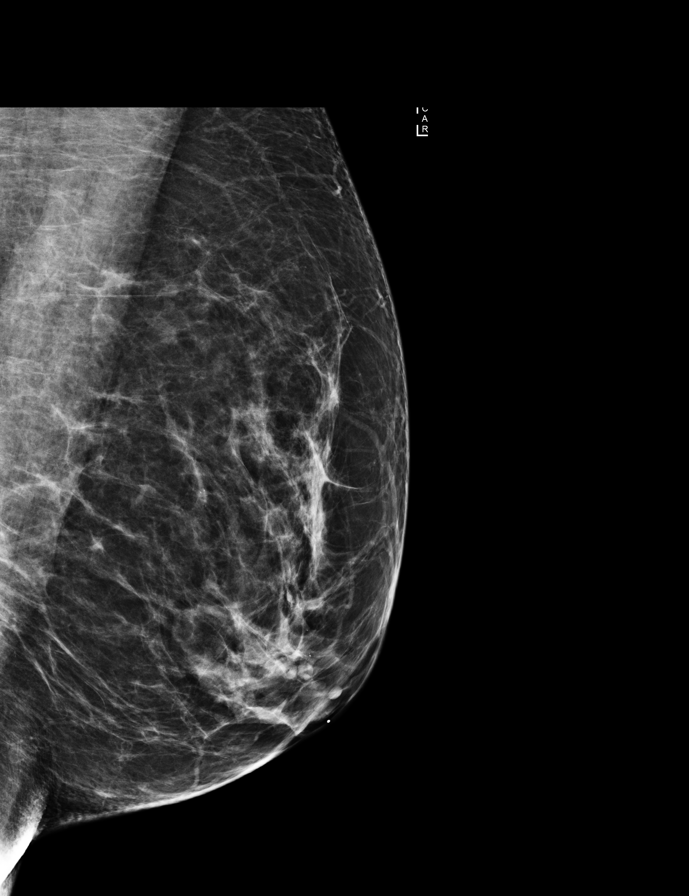

[R MLO (2 of 2)]
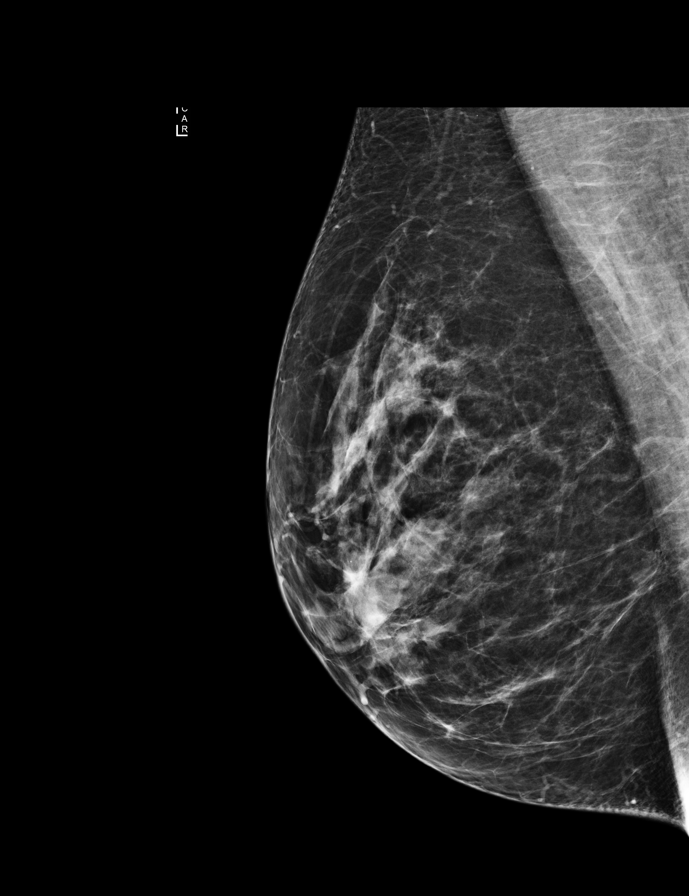

[R CC]
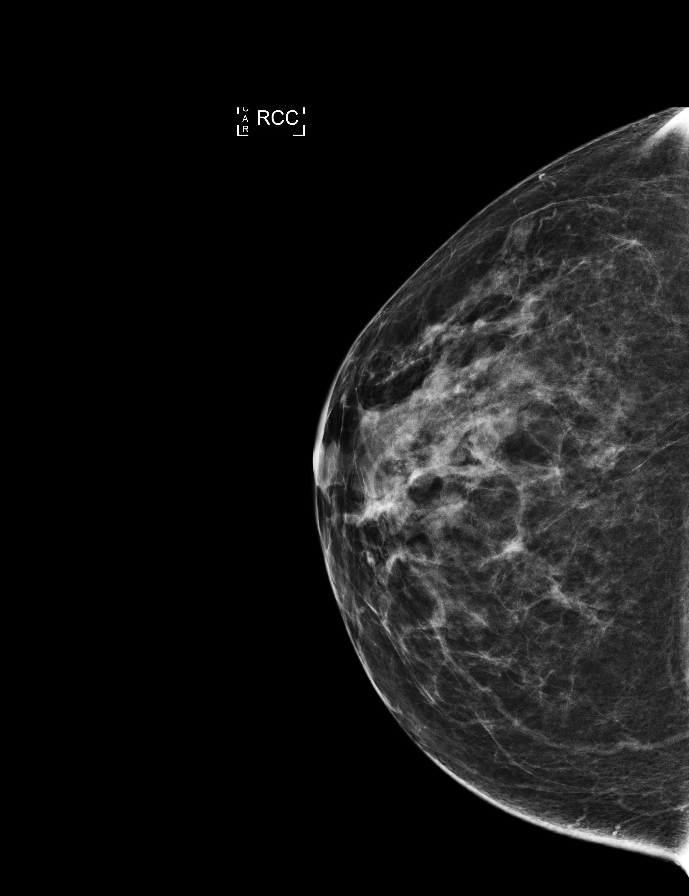

[L CC]
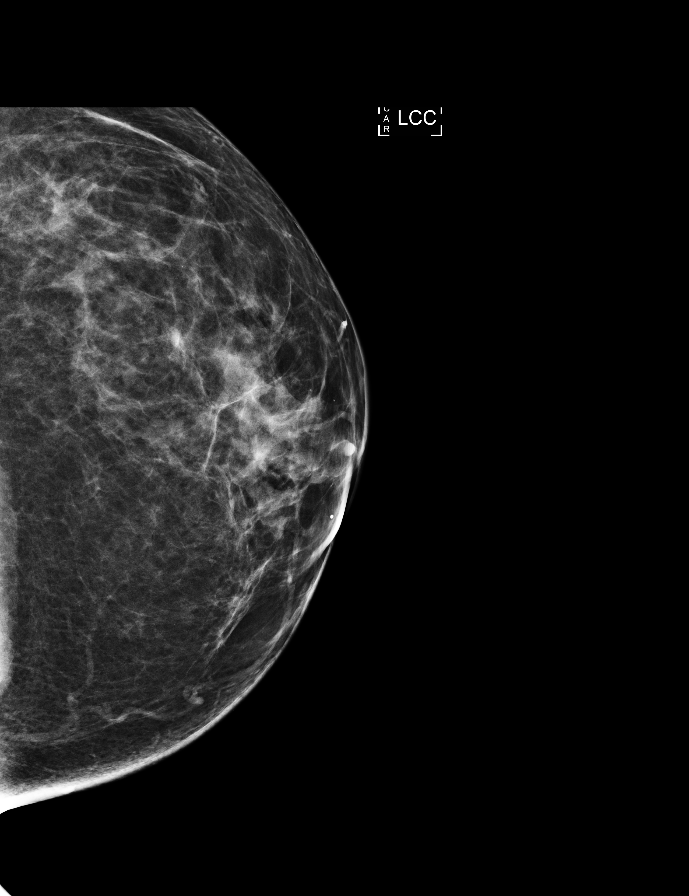

[L CC tomo · tomo slice 37/73.0]
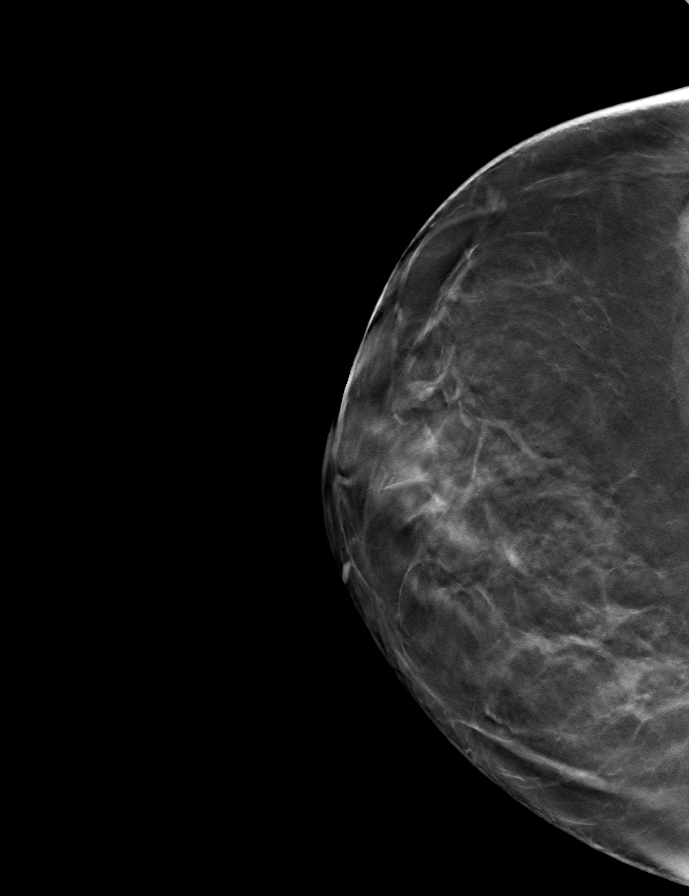

[R CC tomo · tomo slice 36/71.0]
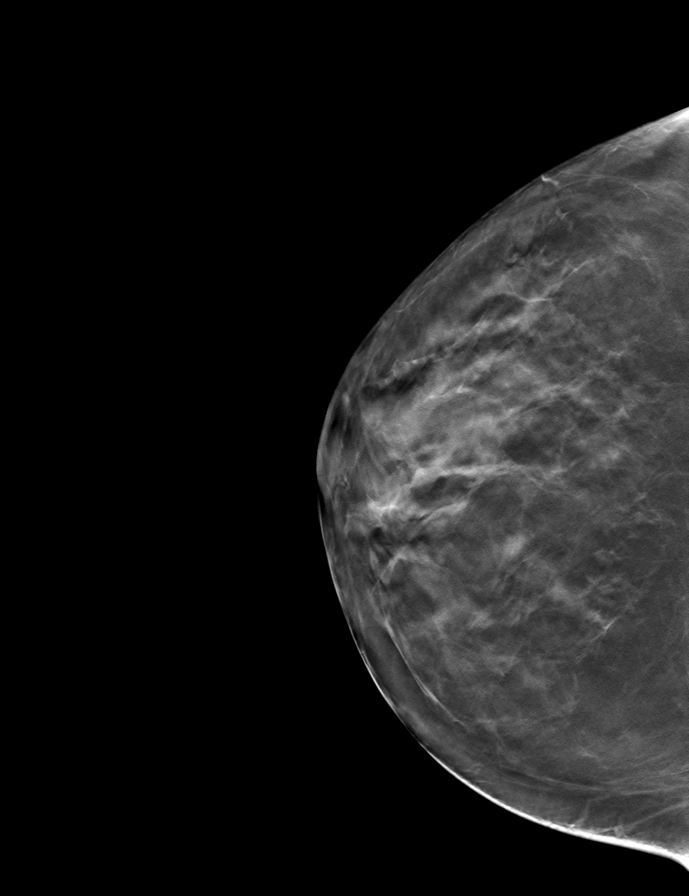

[L MLO tomo · tomo slice 37/73.0]
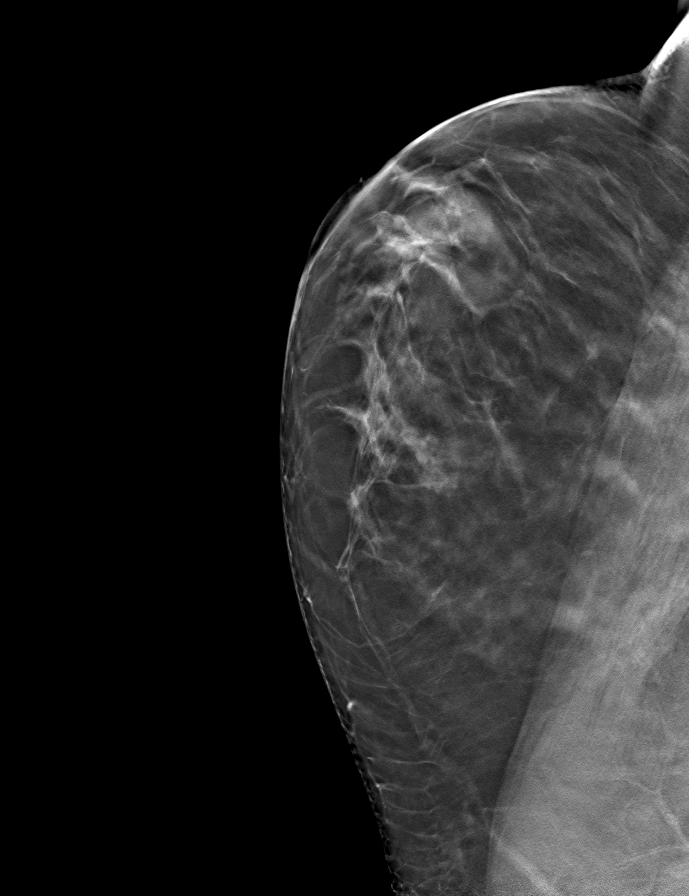

[R MLO tomo · tomo slice 37/72.0]
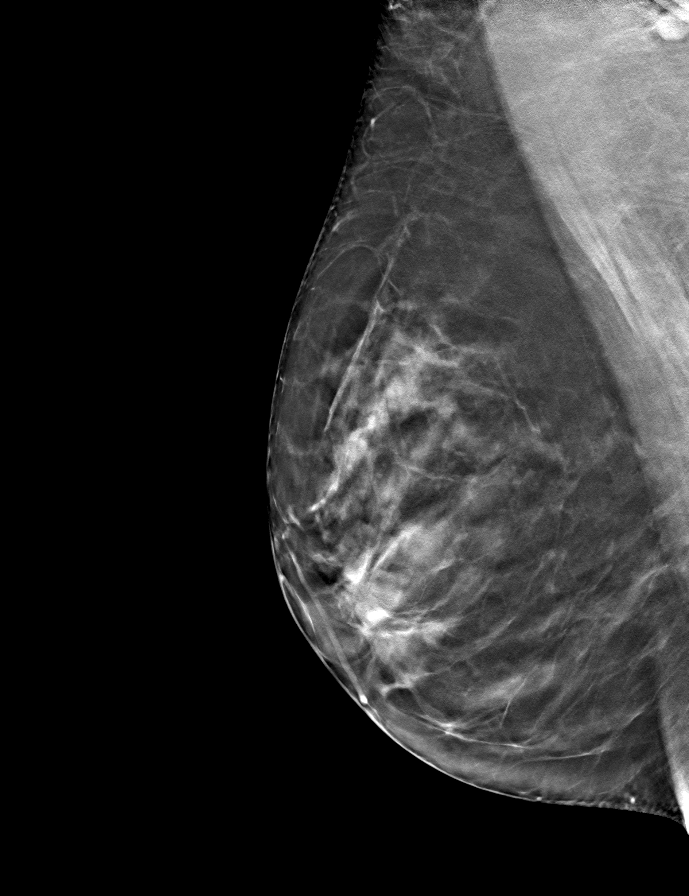

[9 of 25 positions shown; findings below may reference images not displayed]

ACR Breast Density Category c: The breast tissue is heterogeneously
dense, which may obscure small masses.
FINDINGS: There are no findings suspicious for malignancy. Images were
processed with CAD.
IMPRESSION: No mammographic evidence of malignancy. A result letter of this
screening mammogram will be mailed directly to the patient.

RECOMMENDATION:
Screening mammogram in one year. (Code:OA-G-1SS)

BI-RADS CATEGORY  1: Negative.

## 2015-07-18 ENCOUNTER — Other Ambulatory Visit: Payer: Self-pay | Admitting: *Deleted

## 2015-07-18 MED ORDER — CALCIUM CARBONATE-VITAMIN D 500-200 MG-UNIT PO TABS: 1.0000 | ORAL_TABLET | Freq: Two times a day (BID) | ORAL | Status: DC

## 2015-09-21 ENCOUNTER — Ambulatory Visit (INDEPENDENT_AMBULATORY_CARE_PROVIDER_SITE_OTHER): Payer: Medicare Other | Admitting: Family Medicine

## 2015-09-21 ENCOUNTER — Encounter: Payer: Self-pay | Admitting: Family Medicine

## 2015-09-21 VITALS — BP 120/71 | HR 120 | Temp 97.5°F | Wt 187.0 lb

## 2015-09-21 DIAGNOSIS — H1132 Conjunctival hemorrhage, left eye: Secondary | ICD-10-CM | POA: Diagnosis not present

## 2015-09-21 NOTE — Progress Notes (Signed)
Patient ID: Jean Williams, female   DOB: 1960/07/11, 55 y.o.   MRN: [id number]   [facility] Aquilla Hacker, MD Phone: [phone]  Subjective:   # Left Eye Puffiness - pt. Says she was told by a coworker that she had some puffiness / irritation around her eye and should be seen by a pysycian.  - No recent vision changes, no blurry vision .  - No pain around her eye.  - No nasal discharge.  - no sinus pain.  - No headache, no pain when she chews or temporal pain.  - No tooth pain.  - No increased lacrimation.   All relevant systems were reviewed and were negative unless otherwise noted in the HPI  Past Medical History Reviewed problem list.  Medications- reviewed and updated Current Outpatient Prescriptions  Medication Sig Dispense Refill  . acetaminophen (TYLENOL) 325 MG tablet 2 tablets by mouth every 4 hours as need for pain/fever.    . Artificial Tear Ointment (ARTIFICIAL TEARS) ointment Place into both eyes as needed. Keep at bedside 3.5 g 12  . atorvastatin (LIPITOR) 40 MG tablet Take 1 tablet (40 mg total) by mouth daily. 90 tablet 3  . calcium-vitamin D (OSCAL WITH D) 500-200 MG-UNIT tablet Take 1 tablet by mouth 2 (two) times daily. 60 tablet 2  . carbamide peroxide (DEBROX) 6.5 % otic solution Place 10 drops into both ears 2 (two) times daily. Dispense 1 bottle 15 mL 0  . cloZAPine (CLOZARIL) 100 MG tablet Take 1 tablet (100 mg total) by mouth 2 (two) times daily. Take 1/2 tab every am & 3 tabs every evening 105 tablet 3  . docusate sodium (COLACE) 100 MG capsule Take 1 capsule (100 mg total) by mouth 2 (two) times daily. 60 capsule 0  . doxycycline (VIBRA-TABS) 100 MG tablet Take 1 tablet (100 mg total) by mouth 2 (two) times daily. 20 tablet 0  . famotidine (PEPCID) 40 MG tablet TAKE 1/2 TABLET (=TO A 20MG  DOSE) BY MOUTH TWICE DAILY. (BEFORE MEALS) 60 tablet 5  . fluticasone (FLOVENT HFA) 110 MCG/ACT inhaler INHALE 1 PUFF TWICE      DAILY. 12 g  11  . loratadine (CLARITIN) 10 MG tablet Take 1 tablet (10 mg total) by mouth daily. 30 tablet 11  . Multiple Vitamins-Minerals (SENTRY) TABS Take 1 tablet by mouth daily. 30 tablet 11  . neomycin-colistin-hydrocortisone-thonzonium (CORTISPORIN-TC) 3.09-06-08-0.5 MG/ML otic suspension Place 3 drops into both ears 4 (four) times daily. 10 mL 1  . PARoxetine (PAXIL) 20 MG tablet Take 1 tablet (20 mg total) by mouth daily. Psychiatrist to fill subsequent rxs for this 30 tablet 0   No current facility-administered medications for this visit.   Chief complaint-noted No additions to family history Social history- patient is a non smoker  Objective: BP 120/71 mmHg  Pulse 120  Temp(Src) 97.5 F (36.4 C) (Oral)  Wt 187 lb (84.823 kg)  SpO2 100%  LMP 06/10/2011 Gen: NAD, alert, cooperative with exam HEENT: NCAT, EOMI, PERRL, TMs nml, EOMI, Fundoscopic exam limited, but no gross abnormalities, unable to clearly see the optic disk. No floaters or gross hemorrhage visible. No conjunctival injection. No skin puffiness, redness, or signs of infection. Grossly normal. CN II-XII in tact.  Neck: FROM, supple, no LAD CV: RRR, good S1/S2, no murmur Resp: CTABL, no wheezes, non-labored Abd: SNTND, BS present, no guarding or organomegaly Ext: No edema, warm, normal tone, moves UE/LE spontaneously Neuro: Alert and oriented, No gross deficits  Skin: no rashes no lesions  Assessment/Plan:  # Eye puffiness - gone now. No ocular changes. Exam normal. Pt. Says her symptoms are gone.  - monitor, no intervention at this time.  - Pt. Given return precautions including vision changes, eye pain, or pressure.  - F/U with pcp as needed

## 2015-09-21 NOTE — Patient Instructions (Signed)
Thanks for letting us take care of you.   You may have had a little irritation, but it looks much better now.   If you develop any vision changes, or pain in your eye then please return for evaluation .   Thanks for letting us take care of you.   Sincerely, Paula Compton, MD Family Medicine - PGY 2

## 2015-09-25 ENCOUNTER — Telehealth: Payer: Self-pay | Admitting: Internal Medicine

## 2015-09-25 NOTE — Telephone Encounter (Signed)
Will forward to PCP to discontinue medication from 11-14-14.  Damiano Stamper,CMA

## 2015-09-25 NOTE — Telephone Encounter (Signed)
Need an discontinue order sent to group home where patient resides for the medication doxycycline.  Fax to attn: Almetta Lovely, [phone]

## 2015-09-27 ENCOUNTER — Encounter: Payer: Self-pay | Admitting: Family Medicine

## 2015-09-27 ENCOUNTER — Ambulatory Visit (INDEPENDENT_AMBULATORY_CARE_PROVIDER_SITE_OTHER): Payer: Medicare Other | Admitting: Family Medicine

## 2015-09-27 VITALS — BP 120/88 | HR 116 | Temp 98.3°F | Ht 66.0 in | Wt 187.0 lb

## 2015-09-27 DIAGNOSIS — L299 Pruritus, unspecified: Secondary | ICD-10-CM | POA: Diagnosis present

## 2015-09-27 MED ORDER — HYDROCORTISONE 2.5 % EX CREA: TOPICAL_CREAM | Freq: Two times a day (BID) | CUTANEOUS | Status: DC

## 2015-09-27 NOTE — Progress Notes (Signed)
   Subjective:    Jean Williams - 55 y.o. female MRN [id number]  Date of birth: 08/08/1960  CC ear itching   HPI  Jean Williams is here for ear itching.  Ear itching:   Occurring for months.  She digs in them all day long.  No pain associated.  Seems like it is draining  No fevers or chills  Have been onear drops before but it never cleared up.  Denies being in the water.  Has not been taking flonase or claritin.  Doesn't seem to be related to weather.  Denies any recent illness.    PMH: panic attacks, schizophrenia, asthma  SH: current tobacco user   Health Maintenance: Health Maintenance Due  Topic Date Due  . Hepatitis C Screening  25-Oct-1960    Review of Systems See HPI     Objective:   Physical Exam BP 120/88 mmHg  Pulse 116  Temp(Src) 98.3 F (36.8 C) (Oral)  Ht 5\' 6"  (1.676 m)  Wt 187 lb (84.823 kg)  BMI 30.20 kg/m2  SpO2 95%  LMP 06/10/2011 Gen: NAD, alert, cooperative with exam HEENT: excoriations on cavum conchae on bilateral ears. No pain to palpation of tragus. No drainage observed. No pain with insertion of otoscope. TM in intact with suggestion of perforation    Assessment & Plan:   Ear itching Denies any eczema anywhere but does have a history of rosacea.  Outside of ear canal suggest that there is a potential for eczematous change within the ear canal.  There is no pain or suggestion of infection.  - demonstrated how to roll a kleenex and put into ear two times daily prior to steroid use.  - sent in hydrocortisone and advised to apply a small drop on a Qtip and apply to the ear canal

## 2015-09-27 NOTE — Patient Instructions (Signed)
Thank you for coming in,   Please roll up the tissue paper and leave in your ear on each side for at least 5 minutes.   Then you put an drop of steroid cream on a Q-tip and apply that on the inside of the ear but making sure not to go in very deep.   Please bring all of your medications with you to each visit.   Sign up for My Chart to have easy access to your labs results, and communication with your Primary care physician   Please feel free to call with any questions or concerns at any time, at [phone]. --Dr. Raeford Razor

## 2015-09-28 DIAGNOSIS — L299 Pruritus, unspecified: Secondary | ICD-10-CM | POA: Insufficient documentation

## 2015-09-28 NOTE — Assessment & Plan Note (Signed)
Denies any eczema anywhere but does have a history of rosacea.  Outside of ear canal suggest that there is a potential for eczematous change within the ear canal.  There is no pain or suggestion of infection.  - demonstrated how to roll a kleenex and put into ear two times daily prior to steroid use.  - sent in hydrocortisone and advised to apply a small drop on a Qtip and apply to the ear canal

## 2015-10-01 ENCOUNTER — Ambulatory Visit (INDEPENDENT_AMBULATORY_CARE_PROVIDER_SITE_OTHER): Payer: Medicare Other | Admitting: Family Medicine

## 2015-10-01 ENCOUNTER — Encounter: Payer: Self-pay | Admitting: Family Medicine

## 2015-10-01 VITALS — BP 119/91 | HR 121 | Temp 98.9°F | Wt 176.0 lb

## 2015-10-01 DIAGNOSIS — J069 Acute upper respiratory infection, unspecified: Secondary | ICD-10-CM | POA: Diagnosis not present

## 2015-10-01 DIAGNOSIS — B9789 Other viral agents as the cause of diseases classified elsewhere: Principal | ICD-10-CM

## 2015-10-01 MED ORDER — FLUTICASONE PROPIONATE 50 MCG/ACT NA SUSP: 2.0000 | Freq: Every day | NASAL | Status: DC

## 2015-10-01 MED ORDER — ALBUTEROL SULFATE HFA 108 (90 BASE) MCG/ACT IN AERS: 1.0000 | INHALATION_SPRAY | Freq: Four times a day (QID) | RESPIRATORY_TRACT | Status: DC | PRN

## 2015-10-01 NOTE — Progress Notes (Signed)
    Subjective:  Jean Williams is a 55 y.o. female who presents to the [facility] today for same day appointment with a chief complaint of cough.   HPI:  Cough Started about 3 days ago. Cough is nonproductive. Worse at night and in the morning. Feels like it is getting a little better. Has some fevers and sweats at night. Also has occasional sneeze. Patient is a smoker and has a history of asthma, but does not have an albuterol inhaler. She has a controller inhaler which she uses regularly. She has tried taking aspirin which has not helped. She has had some wheezing, but no worsened shortness of breath. No noticeable rhinorrhea, nasal congestion, or sore throat. No sick contacts.  ROS: Per HPI  Objective:  Physical Exam: BP 119/91 mmHg  Pulse 121  Temp(Src) 98.9 F (37.2 C) (Oral)  Wt 176 lb (79.833 kg)  SpO2 95%  LMP 06/10/2011  Gen: NAD, resting comfortably, speaking in full sentences HEENT: MMM, OP clear. Nasal turbinate erythematous bilaterally. CV: RRR with no murmurs appreciated Pulm: NWOB, Diffuse wheezes and rhonchi noted throughout.  MSK: no edema, cyanosis, or clubbing noted Skin: warm, dry Neuro: grossly normal, moves all extremities  Assessment/Plan:  Cough Likely viral URI with mild asthma exacerbation. No signs of bacterial infection. Maintaining good air movement on exam. Will prescribe albuterol inhaler and fluticasone nasal spray. Return precautions reviewed. Will not give prednisone at this time as patient is not having any shortness of breath. She may have COPD component given smoking history. Patient will likely need PFTs after this acute flare is over, will defer to PCP. If not improving within 5-7 days or worsens, can consider steroid burst with antibiotics to treat for possible COPD exacerbation.   Algis Greenhouse. Jerline Pain, [facility] Resident PGY-2 10/01/2015 3:33 PM

## 2015-10-01 NOTE — Patient Instructions (Signed)
You have a virus. Please use the albuterol as needed. Please also use the nasal spray.  If you are not getting better in 5-7 days or if you are getting worse, please let us know.  Take care,  Dr Jerline Pain

## 2015-12-25 ENCOUNTER — Other Ambulatory Visit: Payer: Self-pay | Admitting: Internal Medicine

## 2015-12-25 MED ORDER — HYDROCORTISONE 2.5 % EX CREA: TOPICAL_CREAM | Freq: Two times a day (BID) | CUTANEOUS | Status: DC

## 2015-12-25 NOTE — Telephone Encounter (Signed)
Pt needs refill on hydrocortizone cream.  Uses [facility].  Sharyn Lull is the pt caregiver at the group home. If the pt is suppose to discontinue use of the cream, please fax a discontinue notice to the group home. The fax number is the same as the phone number.

## 2016-03-07 ENCOUNTER — Ambulatory Visit: Payer: Medicare Other | Admitting: Internal Medicine

## 2016-03-11 ENCOUNTER — Ambulatory Visit (INDEPENDENT_AMBULATORY_CARE_PROVIDER_SITE_OTHER): Payer: Medicare Other | Admitting: Family Medicine

## 2016-03-11 ENCOUNTER — Encounter: Payer: Self-pay | Admitting: Family Medicine

## 2016-03-11 VITALS — BP 123/89 | HR 115 | Temp 98.8°F | Wt 184.0 lb

## 2016-03-11 DIAGNOSIS — L309 Dermatitis, unspecified: Secondary | ICD-10-CM

## 2016-03-11 DIAGNOSIS — Z23 Encounter for immunization: Secondary | ICD-10-CM | POA: Diagnosis not present

## 2016-03-11 DIAGNOSIS — R000 Tachycardia, unspecified: Secondary | ICD-10-CM | POA: Diagnosis not present

## 2016-03-11 DIAGNOSIS — L719 Rosacea, unspecified: Secondary | ICD-10-CM

## 2016-03-11 MED ORDER — PERMETHRIN 5 % EX CREA: 1.0000 "application " | TOPICAL_CREAM | Freq: Every day | CUTANEOUS | 0 refills | Status: AC

## 2016-03-11 MED ORDER — HYDROCORTISONE 2.5 % EX CREA: TOPICAL_CREAM | Freq: Two times a day (BID) | CUTANEOUS | 0 refills | Status: AC

## 2016-03-11 MED ORDER — METRONIDAZOLE 0.75 % EX GEL: 1.0000 "application " | Freq: Two times a day (BID) | CUTANEOUS | 0 refills | Status: AC

## 2016-03-11 NOTE — Patient Instructions (Signed)
It was nice seeing you today. Your skin rash looks like scabies. Please change your bed sheet or wash with hot water. I have prescribed Permethrin cream for you. Use daily for 7 days and then twice weeksly for 3 doses. Come see you PCP soon for follow up or sooner if no improvement with the cream. You can also use the hydrocortisone cream once a day to help with the itching and inflammation.   I prescribed metrogel for your rosacea. See your PCP in 1 wk for follow up.   Scabies, Adult Scabies is a skin condition that happens when very small insects get under the skin (infestation). This causes a rash and severe itchiness. Scabies can spread from person to person (is contagious). If you get scabies, it is common for others in your household to get scabies too. With proper treatment, symptoms usually go away in 2-4 weeks. Scabies usually does not cause lasting problems. CAUSES This condition is caused by mites (Sarcoptes scabiei, or human itch mites) that can only be seen with a microscope. The mites get into the top layer of skin and lay eggs. Scabies can spread from person to person through:  Close contact with a person who has scabies.  Contact with infested items, such as towels, bedding, or clothing. RISK FACTORS This condition is more likely to develop in:  People who live in nursing homes and other extended-care facilities.  People who have sexual contact with a partner who has scabies.  Young children who attend child care facilities.  People who care for others who are at increased risk for scabies. SYMPTOMS Symptoms of this condition may include:  Severe itchiness. This is often worse at night.  A rash that includes tiny red bumps or blisters. The rash commonly occurs on the wrist, elbow, armpit, fingers, waist, groin, or buttocks. Bumps may form a line (burrow) in some areas.  Skin irritation. This can include scaly patches or sores. DIAGNOSIS This condition is diagnosed  with a physical exam. Your health care provider will look closely at your skin. In some cases, your health care provider may take a sample of your affected skin (skin scraping) and have it examined under a microscope. TREATMENT This condition may be treated with:  Medicated cream or lotion that kills the mites. This is spread on the entire body and left on for several hours. Usually, one treatment with medicated cream or lotion is enough to kill all of the mites. In severe cases, the treatment may be repeated.  Medicated cream that relieves itching.  Medicines that help to relieve itching.  Medicines that kill the mites. This treatment is rarely used. HOME CARE INSTRUCTIONS Medicines  Take or apply over-the-counter and prescription medicines as told by your health care provider.  Apply medicated cream or lotion as told by your health care provider.  Do not wash off the medicated cream or lotion until the necessary amount of time has passed. Skin Care  Avoid scratching your affected skin.  Keep your fingernails closely trimmed to reduce injury from scratching.  Take cool baths or apply cool washcloths to help reduce itching. General Instructions  Clean all items that you recently had contact with, including bedding, clothing, and furniture. Do this on the same day that your treatment starts.  Use hot water when you wash items.  Place unwashable items into closed, airtight plastic bags for at least 3 days. The mites cannot live for more than 3 days away from human skin.  Vacuum  furniture and mattresses that you use.  Make sure that other people who may have been infested are examined by a health care provider. These include members of your household and anyone who may have had contact with infested items.  Keep all follow-up visits as told by your health care provider. This is important. SEEK MEDICAL CARE IF:  You have itching that does not go away after 4 weeks of  treatment.  You continue to develop new bumps or burrows.  You have redness, swelling, or pain in your rash area after treatment.  You have fluid, blood, or pus coming from your rash.   This information is not intended to replace advice given to you by your health care provider. Make sure you discuss any questions you have with your health care provider.   Document Released: 03/14/2015 Document Reviewed: 01/23/2015 Elsevier Interactive Patient Education [employer].

## 2016-03-11 NOTE — Progress Notes (Signed)
Subjective:     Patient ID: Jean Williams, female   DOB: Oct 09, 1960, 55 y.o.   MRN: [id number]  Rash  This is a recurrent (First episode was when she was younger about 40 yrs ago. 1 month ago she started having similar rash) problem. The current episode started more than 1 month ago. The problem has been gradually worsening since onset. The affected locations include the chest, face, right elbow and left elbow (back of the leg. This is different from her rosacea). The rash is characterized by redness, itchiness, dryness and scaling. She was exposed to nothing. Pertinent negatives include no anorexia, cough, diarrhea, facial edema, fever, shortness of breath, sore throat or vomiting. (No chest pain) Treatments tried: Hydrocortisone prescribed for her ears helps some. The treatment provided moderate relief. There is no history of allergies or eczema.  It seems her sister has similar rash. They just got a new dog that might have a rash but she is not sure of this. Rosacea: She is requesting treatment of her facial rash as well. Tachycardia: Denies chest pain, no SOB, she stated this is not new for her. Denies any concern.  Current Outpatient Prescriptions on File Prior to Visit  Medication Sig Dispense Refill  . cloZAPine (CLOZARIL) 100 MG tablet Take 1 tablet (100 mg total) by mouth 2 (two) times daily. Take 1/2 tab every am & 3 tabs every evening 105 tablet 3  . Multiple Vitamins-Minerals (SENTRY) TABS Take 1 tablet by mouth daily. 30 tablet 11  . PARoxetine (PAXIL) 20 MG tablet Take 1 tablet (20 mg total) by mouth daily. Psychiatrist to fill subsequent rxs for this 30 tablet 0  . acetaminophen (TYLENOL) 325 MG tablet 2 tablets by mouth every 4 hours as need for pain/fever.    Marland Kitchen albuterol (PROVENTIL HFA;VENTOLIN HFA) 108 (90 Base) MCG/ACT inhaler Inhale 1-2 puffs into the lungs every 6 (six) hours as needed for wheezing or shortness of breath. 2 Inhaler 2  . Artificial Tear Ointment (ARTIFICIAL  TEARS) ointment Place into both eyes as needed. Keep at bedside 3.5 g 12  . atorvastatin (LIPITOR) 40 MG tablet Take 1 tablet (40 mg total) by mouth daily. 90 tablet 3  . calcium-vitamin D (OSCAL WITH D) 500-200 MG-UNIT tablet Take 1 tablet by mouth 2 (two) times daily. 60 tablet 2  . carbamide peroxide (DEBROX) 6.5 % otic solution Place 10 drops into both ears 2 (two) times daily. Dispense 1 bottle 15 mL 0  . docusate sodium (COLACE) 100 MG capsule Take 1 capsule (100 mg total) by mouth 2 (two) times daily. 60 capsule 0  . famotidine (PEPCID) 40 MG tablet TAKE 1/2 TABLET (=TO A 20MG  DOSE) BY MOUTH TWICE DAILY. (BEFORE MEALS) 60 tablet 5  . fluticasone (FLONASE) 50 MCG/ACT nasal spray Place 2 sprays into both nostrils daily. 16 g 6  . fluticasone (FLOVENT HFA) 110 MCG/ACT inhaler INHALE 1 PUFF TWICE      DAILY. 12 g 11  . hydrocortisone 2.5 % cream Apply topically 2 (two) times daily. 30 g 0  . loratadine (CLARITIN) 10 MG tablet Take 1 tablet (10 mg total) by mouth daily. 30 tablet 11  . neomycin-colistin-hydrocortisone-thonzonium (CORTISPORIN-TC) 3.09-06-08-0.5 MG/ML otic suspension Place 3 drops into both ears 4 (four) times daily. 10 mL 1   No current facility-administered medications on file prior to visit.    Past Medical History:  Diagnosis Date  . 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 Basal cell carcinoma, lip 05/13/2011  Left upper lip. Treated with Mohs micrographic surgery by Dr. Tonita Cong MD. On 08/22/11.    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 Benign per Derm. Dr. Lajean Saver.        Review of Systems  Constitutional: Negative for fever.  HENT: Negative for sore throat.   Respiratory: Negative.  Negative for cough, chest tightness and shortness of breath.   Cardiovascular: Negative.  Negative for chest pain and palpitations.  Gastrointestinal: Negative for anorexia, diarrhea and vomiting.    Skin: Positive for rash.  All other systems reviewed and are negative.      Vitals:   03/11/16 0822 03/11/16 0841  BP: 123/89   Pulse: (!) 125 (!) 115  Temp: 98.8 F (37.1 C)   TempSrc: Oral   Weight: 184 lb (83.5 kg)     Objective:   Physical Exam  Constitutional: She is oriented to person, place, and time. She appears well-developed. No distress.  Cardiovascular: Normal rate, regular rhythm, normal heart sounds and intact distal pulses.   No murmur heard. Pulmonary/Chest: Effort normal and breath sounds normal. No respiratory distress. She has no wheezes.  Musculoskeletal: Normal range of motion. She exhibits no edema.  Neurological: She is alert and oriented to person, place, and time.  Skin: Skin is warm. Rash noted.  Dry, scaly, erythematous, macular rash with excoriation marks and crusting on her chest, her elbow and knee creases, wrist creases B/L. Few burrow lesion seen.  Nursing note and vitals reviewed.        Assessment:     Dermatitis : Likely scabies. Rosacea Tachycardia    Plan:     1. Permathrin cream prescribed. Use instruction given.     F/U in 1 wk for reassessment or sooner if rash worsened.     May use hydrocortisone alternating with permathrin since it helped some with her symptoms.     Letter to stay off work for 3 days after initiating treatment given.     Contact precaution discussed to prevent spread.  NB: Jean Williams from group home called to speak with me ([phone]), she stated patient informed her of her diagnosis and will like to know if there is anything to do to prevent spread.         Contact avoidance as well as bed sheet cleaning discussed.  2. Metrogel prescribed for her.     F/U with PCP soon for further management.  3. I reviewed her record, this has been on going since 2010. She had full work up back then.'     This is likely due to Clozaril.     F/U with PCP soon to discuss med. She is currently asymptomatic.

## 2016-03-13 ENCOUNTER — Telehealth: Payer: Self-pay | Admitting: Internal Medicine

## 2016-03-13 NOTE — Telephone Encounter (Signed)
Will forward to PCP.  Martin, Tamika L, RN  

## 2016-03-13 NOTE — Telephone Encounter (Signed)
Pt's caregiver would like someone to call in a Rx for soap to get rid of rash and itching from scabies. Please advise. Thanks! ep

## 2016-03-13 NOTE — Telephone Encounter (Signed)
Spoke with Dr. Gwendlyn Deutscher who saw patient on 9/5. She states the is no soap for scabies and that patient should use the permertherin cream prescribed at visit. Tried calling caregiver to inform but line just rings with no option for voicemail. If caregiver calls back please inform her of above.

## 2016-03-21 ENCOUNTER — Ambulatory Visit (INDEPENDENT_AMBULATORY_CARE_PROVIDER_SITE_OTHER): Payer: Medicare Other | Admitting: Internal Medicine

## 2016-03-21 ENCOUNTER — Encounter: Payer: Self-pay | Admitting: Internal Medicine

## 2016-03-21 DIAGNOSIS — R21 Rash and other nonspecific skin eruption: Secondary | ICD-10-CM | POA: Insufficient documentation

## 2016-03-21 NOTE — Progress Notes (Signed)
   [facility] Phone: [phone]  Subjective:  Jean Williams is a 55 year old female presenting to clinic for follow-up of a rash. She was seen on 9/5 by Dr. Gwendlyn Deutscher and was thought to have scabies because a few burrow lesions were appreciated. She was given Permethrin cream, which she has been using daily. She feels like the cream has really helped. The rash has greatly improved. She still has some residual erythema in her antecubital fossas bilaterally and her left popliteal fossa. The itching has improved as well. She has been using hydrocortisone cream intermittently. She has also washed her bed sheets three times. She denies any fevers or spreading redness.  ROS: See HPI for pertinent positives and negatives  Past Medical History- Persistent asthma, HLD, schizophrenia, tobacco abuse  Family history reviewed for today's visit. No changes.  Social history- patient is a current smoker. Works in [employer] at [employer]. Lives in a group home.  Objective: BP 113/80   Pulse (!) 123   Temp 98.7 F (37.1 C) (Oral)   Ht 5\' 6"  (1.676 m)   Wt 182 lb (82.6 kg)   LMP 06/10/2011   SpO2 95%   BMI 29.38 kg/m  Gen: NAD, alert, cooperative with exam HEENT: NCAT, EOMI, MMM Skin: Antecubital fossas are mildly erythematous with few minor overlying excoriations. Left popliteal fossa area is mildly erythematous. No burrow lesions seen. Skin overall appears dry.   Assessment/Plan: Rash: Improved. Thought to be secondary to scabies. Has undergone treatment with Permethrin cream, which has helped a lot. Rash has almost completely resolved. Skin overall appears dry. - Do not think she needs another round of treatment with Permethrin at this time - Advised Pt to use Aquaphor or Vaseline to skin twice daily - Can use hydrocortisone cream (previous prescription) as needed to the antecubital fossas and left popliteal fossa regions. - Follow-up in 6 months or earlier if needed   Hyman Bible,  MD PGY-2

## 2016-03-21 NOTE — Patient Instructions (Signed)
It was nice to see you!  I would recommend that you use Aquaphor or Vaseline to the dry areas of skin twice a day. Make sure one of these times is after a shower.  You should also use the cortisone cream 1-2 times per day.  I think your scabies has completely resolved.  -Dr. Brett Albino

## 2016-03-21 NOTE — Assessment & Plan Note (Addendum)
Improved. Thought to be secondary to scabies. Has undergone treatment with Permethrin cream, which has helped a lot. Rash has almost completely resolved. Skin overall appears dry. - Do not think she needs another round of treatment with Permethrin at this time - Advised Pt to use Aquaphor or Vaseline to skin twice daily - Can use hydrocortisone cream (previous prescription) as needed to the antecubital fossas and left popliteal fossa regions. - Follow-up in 6 months or earlier if needed

## 2016-04-08 ENCOUNTER — Other Ambulatory Visit: Payer: Self-pay | Admitting: Internal Medicine

## 2016-04-08 ENCOUNTER — Other Ambulatory Visit: Payer: Self-pay | Admitting: Family Medicine

## 2016-04-14 ENCOUNTER — Encounter: Payer: Self-pay | Admitting: Student

## 2016-04-14 ENCOUNTER — Ambulatory Visit (INDEPENDENT_AMBULATORY_CARE_PROVIDER_SITE_OTHER): Payer: Medicare Other | Admitting: Student

## 2016-04-14 ENCOUNTER — Ambulatory Visit: Payer: Medicare Other | Admitting: Family Medicine

## 2016-04-14 DIAGNOSIS — R21 Rash and other nonspecific skin eruption: Secondary | ICD-10-CM | POA: Diagnosis present

## 2016-04-14 MED ORDER — HYDROCORTISONE 2.5 % EX CREA: TOPICAL_CREAM | Freq: Two times a day (BID) | CUTANEOUS | 1 refills | Status: DC

## 2016-04-14 NOTE — Progress Notes (Signed)
   Subjective:    Patient ID: Jean Williams, female    DOB: 1961/06/07, 55 y.o.   MRN: [id number]   CC: Rash  HPI: 55 year old female presents for continued rash  Rash - Worse over the insides of her bilateral elbows - Itchy in nature.  -She does feel that the hydrocortisone she was prescribed previously helped but reports taking it only intermittently - She reports that she has not been moisturizing daily - Given previous diagnosis scabies she has changed her bedding, washed all of her clothing - Denies contact with anyone else with similar rash - Denies changes in medications, exposure to substances like detergerants  - she additionally notes rash on her neck which she had had before and states that she has " skin that likes to break out"  Smoking status reviewed Smokes one pack per day  Review of Systems  Per HPI, else denies recent illness, fever, chest pain, shortness of breath, abdominal pain, N/V/D, weakness    Objective:  BP (!) 131/95   Pulse (!) 118   Temp 98.6 F (37 C) (Oral)   Wt 180 lb (81.6 kg)   LMP 06/10/2011   SpO2 100%   BMI 29.05 kg/m  Vitals and nursing note reviewed  General: NAD Cardiac: RRR Respiratory: CTAB, normal effort Extremities: no edema or cyanosis. WWP. Skin: Dry, scaly rash over bilateral antecubital fossa. Rash approximately 5 cm in diameter. Papular, erythematous rash over neck and superior chest. No rash in interdigit spaces  Rash Neuro: alert and oriented   Assessment & Plan:    Rash and nonspecific skin eruption Rash in elbows appears to be more due to dry skin and non compliance with hydration regimen as laid out by previous provider. Will treat with hydrocortisone as well as given hydration precautions to use eucerin and vaseline daily She additionally reports rash on her neck which she has had before. Differential includes exposure, insect bites. Less likely exposure driven given no change in product use, also insect  bites less likely given rash limited to her neck and superior chest. Will treat with hydrocortisone for non specific dermatitis Will follow with PCP for resolution of rash    Jean Williams A. Lincoln Brigham MD, [facility] Resident PGY-3 Pager [phone]

## 2016-04-14 NOTE — Assessment & Plan Note (Addendum)
Rash in elbows appears to be more due to dry skin and non compliance with hydration regimen as laid out by previous provider. Will treat with hydrocortisone as well as given hydration precautions to use eucerin and vaseline daily She additionally reports rash on her neck which she has had before. Differential includes exposure, insect bites. Less likely exposure driven given no change in product use, also insect bites less likely given rash limited to her neck and superior chest. Will treat with hydrocortisone for non specific dermatitis Will follow with PCP for resolution of rash

## 2016-04-14 NOTE — Patient Instructions (Signed)
Follow up with your PCP in 2 weeks for rash Apply hydrocortizone twice daily for rash If you have questions or concerns, call the office at [phone]

## 2016-04-28 ENCOUNTER — Ambulatory Visit (INDEPENDENT_AMBULATORY_CARE_PROVIDER_SITE_OTHER): Payer: Medicare Other | Admitting: Internal Medicine

## 2016-04-28 ENCOUNTER — Encounter: Payer: Self-pay | Admitting: Internal Medicine

## 2016-04-28 DIAGNOSIS — R21 Rash and other nonspecific skin eruption: Secondary | ICD-10-CM

## 2016-04-28 MED ORDER — CLOBETASOL PROPIONATE 0.05 % EX CREA: 1.0000 "application " | TOPICAL_CREAM | Freq: Two times a day (BID) | CUTANEOUS | 0 refills | Status: DC

## 2016-04-28 NOTE — Progress Notes (Signed)
   [facility] Phone: [phone]  Subjective:  Jean Williams is a 55 year old female presenting to clinic for follow-up of a rash. She has had an itchy, erythematous rash since 03/11/16. She has been seen multiple times in clinic for this. At her initial visit, she was thought to have scabies. She was treated with Permethrin cream and washed all of her clothes and bedding. She has since been seen twice in clinic for dry, itchy, erythematous rash on her chest and antecubital fossa bilaterally. She was prescribed Hydrocortisone cream and advised to use lotion twice daily. The rash hasn't gotten better. She denies any fevers, chills, nausea, vomiting, or spreading redness.   ROS: See HPI for pertinent positives and negatives  Past Medical History- Schizophrenia, MDD, panic attacks, tobacco use, asthma  Family history reviewed for today's visit. No changes.  Social history- patient is a current smoker  Objective: LMP 06/10/2011  Gen: NAD, alert, cooperative with exam Skin: Dry, erythematous patches of skin with overlying excoriations present over the antecubital fossa bilaterally, skin on chest appears erythematous and dry, no burrow lesions appreciated.   Assessment/Plan: Rash: Pt with itchy, erythematous rash on antecubital fossa for the last ~2 months. She was initially treated with Permethrin cream for scabies after burrow lesions were appreciated at her initial clinic visit. Now she continues to have a rash that has not improved with Hydrocortisone cream. Appearance is similar to eczema. Differentials include post-scabies dermatitis vs irritant dermatitis secondary to the scabies treatment vs eczema. - Advised Pt to continue using lotion or Vaseline twice a day to the skin - Will prescribe Clobetasol cream x 2 weeks - Considered treatment with oral Prednisone, but given her history of schizophrenia (being treated with Clozaril), do not want to cause a steroid-induced  psychosis - Follow up in 2 weeks. Will refer to Dermatology if rash has not improved.   Hyman Bible, MD PGY-2

## 2016-04-28 NOTE — Patient Instructions (Signed)
It was so nice to see you!  I have sent in a prescription for Clobetasol cream. You should use this twice a day for 2 weeks. Please give me a call in 2 weeks to let me know if the rash is improving.  -Dr. Brett Albino

## 2016-04-29 ENCOUNTER — Telehealth: Payer: Self-pay | Admitting: *Deleted

## 2016-04-29 NOTE — Assessment & Plan Note (Signed)
Pt with itchy, erythematous rash on antecubital fossa for the last ~2 months. She was initially treated with Permethrin cream for scabies after burrow lesions were appreciated at her initial clinic visit. Now she continues to have a rash that has not improved with Hydrocortisone cream. Appearance is similar to eczema. Differentials include post-scabies dermatitis vs irritant dermatitis secondary to the scabies treatment vs eczema. - Advised Pt to continue using lotion or Vaseline twice a day to the skin - Will prescribe Clobetasol cream x 2 weeks - Considered treatment with oral Prednisone, but given her history of schizophrenia (being treated with Clozaril), do not want to cause a steroid-induced psychosis - Follow up in 2 weeks. Will refer to Dermatology if rash has not improved.

## 2016-04-29 NOTE — Telephone Encounter (Signed)
Prior Authorization received from [place] for clobetasol 0.05% cream.  PA form placed in provider box for completion. Derl Barrow, RN

## 2016-04-30 NOTE — Telephone Encounter (Signed)
Prior authorization filled out and placed in Tamika's box.  Hyman Bible, MD

## 2016-05-05 NOTE — Telephone Encounter (Signed)
PA form faxed to [facility] for review.  Martin, Tamika L, RN  

## 2016-05-05 NOTE — Telephone Encounter (Signed)
Bentia from [facility],stated the pharmacy has not received it yet. Please advise. Thanks! ep

## 2016-05-06 NOTE — Telephone Encounter (Signed)
Prior Authorization for clobetasol cream was approved via [facility].  Approval valid 02/05/16-05/05/17.

## 2016-06-27 ENCOUNTER — Encounter: Payer: Self-pay | Admitting: Internal Medicine

## 2016-07-18 ENCOUNTER — Other Ambulatory Visit: Payer: Self-pay | Admitting: Internal Medicine

## 2016-07-18 MED ORDER — BUSPIRONE HCL 15 MG PO TABS: 15.0000 mg | ORAL_TABLET | Freq: Three times a day (TID) | ORAL | 0 refills | Status: DC

## 2016-07-18 NOTE — Progress Notes (Signed)
6 month drug review completed and sent back to [facility].

## 2016-08-18 ENCOUNTER — Ambulatory Visit: Payer: Medicare Other | Admitting: Family Medicine

## 2016-08-19 ENCOUNTER — Encounter: Payer: Self-pay | Admitting: *Deleted

## 2016-08-19 ENCOUNTER — Ambulatory Visit (INDEPENDENT_AMBULATORY_CARE_PROVIDER_SITE_OTHER): Payer: Medicare Other | Admitting: Family Medicine

## 2016-08-19 ENCOUNTER — Encounter: Payer: Self-pay | Admitting: Family Medicine

## 2016-08-19 VITALS — BP 106/78 | HR 118 | Temp 98.5°F | Ht 66.0 in | Wt 175.6 lb

## 2016-08-19 DIAGNOSIS — B9789 Other viral agents as the cause of diseases classified elsewhere: Secondary | ICD-10-CM

## 2016-08-19 DIAGNOSIS — F209 Schizophrenia, unspecified: Secondary | ICD-10-CM | POA: Diagnosis not present

## 2016-08-19 DIAGNOSIS — J069 Acute upper respiratory infection, unspecified: Secondary | ICD-10-CM | POA: Diagnosis not present

## 2016-08-19 MED ORDER — GUAIFENESIN-DM 100-10 MG/5ML PO SYRP: 5.0000 mL | ORAL_SOLUTION | ORAL | 0 refills | Status: AC | PRN

## 2016-08-19 MED ORDER — IPRATROPIUM BROMIDE 0.06 % NA SOLN: 2.0000 | Freq: Four times a day (QID) | NASAL | 0 refills | Status: DC

## 2016-08-19 NOTE — Progress Notes (Signed)
    Subjective:  Jean Williams is a 56 y.o. female who presents to the [facility] today with a chief complaint of congestion and low mood.   HPI:  Sinus Congestion Symptoms started 2 days. Associated symptoms include sinus pressure, rhinorrhea, cough, sneeze, some sore throat. No medications tried. No fevers. No shortness of breath. Lives in a group home and several other members have similar symptoms.  Low Mood/Schizophrenia Low mood and low energy for months. Thinks that the clozaril is causing this. No SI or HI.   ROS: Per HPI  PMH: Smoking history reviewed.   Objective:  Physical Exam: BP 106/78   Pulse (!) 118   Temp 98.5 F (36.9 C) (Oral)   Ht 5\' 6"  (1.676 m)   Wt 175 lb 9.6 oz (79.7 kg)   LMP 06/10/2011   SpO2 98%   BMI 28.34 kg/m   Gen: NAD, resting comfortably HEENT: MMM. TMs clear bilaterally. Nasal turbinates boggy bilaterally with clear discharge.  CV: Tachycardic, no murmurs.  Pulm: NWOB, Occasional ronchi noted, otherwise clear.  MSK: no edema, cyanosis, or clubbing noted Skin: warm, dry Neuro: grossly normal, moves all extremities Psych: Tangential. No apparent AVH. No SI or HI   Assessment/Plan:  Viral URI with Cough No signs of bacterial infection. Will treat symptomatically with atrovent and robitussin. Encouraged tylenol or ibuprofen as needed for pain and fever. Return precautions reviewed. Follow up as needed.  Schizophrenia, unspecified type ([place]) No apparent AVH today. No SI or HI. Patient meet with [facility] today. Asked her to schedule a follow up appointment and to see her psychiatrist soon.   Algis Greenhouse. Jerline Pain, [facility] Resident PGY-3 08/19/2016 11:38 AM

## 2016-08-19 NOTE — Patient Instructions (Addendum)
You have a viral infection.  Use the atrovent and robutussin.  Please see your psychiatrist soon.   Take care,  Dr Jerline Pain

## 2016-08-19 NOTE — Progress Notes (Signed)
Dr. Jerline Pain requested a [place].   Presenting Issue:  Patient complaining of her body feeling weird, thinks this may be due to Clozaril. Patient states that it is hard for her to explain how she is feeling, but that it is uncomfortable and she would like a psychiatrist to sit with her and listen so that they know how to change her medications to help with this.   Assessment / Plan / Recommendations: Patient states that she has a psychiatrist who she sees once a month - Dr. Alphonzo Grieve at [facility]. We discussed how she could have this conversation with him. I worked through some problem-solving and planning with her, and she made a plan to call her psychiatrist when she got back home and to make an appointment with him as soon as possible. Patient stated feeling comfortable with this plan.

## 2016-08-19 NOTE — Assessment & Plan Note (Addendum)
No apparent AVH today. No SI or HI. Patient meet with [facility] today. Asked her to schedule a follow up appointment and to see her psychiatrist soon.

## 2016-08-29 ENCOUNTER — Telehealth: Payer: Self-pay | Admitting: *Deleted

## 2016-08-29 ENCOUNTER — Other Ambulatory Visit: Payer: Self-pay | Admitting: Internal Medicine

## 2016-08-29 NOTE — Telephone Encounter (Signed)
Please let administrator know that I already wrote these orders on a prescription pad this morning and faxed it to their office. Thanks! - AJL

## 2016-08-29 NOTE — Telephone Encounter (Signed)
Ms. Rulon Eisenmenger, administrator,  with patient's group home ask that they receive a new order reflecting that patient's clobetasol cream is to be used PRN and that also her atrovent can be taken TID vs QID since patient is not at the home for her noon dose.  Please print this in a letter or on a script pad and fax to [phone].  Thanks [employer]

## 2016-08-29 NOTE — Telephone Encounter (Signed)
Spoke with group home and made them aware that order is being faxed today.Ronette Hank,CMA

## 2016-10-06 ENCOUNTER — Other Ambulatory Visit: Payer: Self-pay | Admitting: *Deleted

## 2016-10-06 MED ORDER — FLUTICASONE PROPIONATE HFA 110 MCG/ACT IN AERO: INHALATION_SPRAY | RESPIRATORY_TRACT | 11 refills | Status: DC

## 2016-11-07 ENCOUNTER — Other Ambulatory Visit: Payer: Self-pay | Admitting: Internal Medicine

## 2016-11-11 ENCOUNTER — Telehealth: Payer: Self-pay | Admitting: Internal Medicine

## 2016-11-11 NOTE — Telephone Encounter (Signed)
FL-2 and personal care  form dropped off for at front desk for completion.  Verified that patient section of form has been completed.  Last DOS with PCP was 04/28/16  Placed form in blue  team folder to be completed by clinical staff.  Carmina Miller

## 2016-11-11 NOTE — Telephone Encounter (Signed)
Clinical info completed on FL2 form.  Place form in Dr. Velia Meyer box for completion.  Jean Williams, [place]

## 2016-11-12 NOTE — Telephone Encounter (Signed)
Form filled out and placed in Jean Williams's box. 

## 2016-11-12 NOTE — Telephone Encounter (Signed)
FL-2 forms copied for scanning in patient's record.  Forms placed in outgoing mail to [address] [place] Attn: Aurea Graff.  Derl Barrow, RN

## 2017-01-05 ENCOUNTER — Other Ambulatory Visit: Payer: Self-pay | Admitting: Internal Medicine

## 2017-01-05 DIAGNOSIS — Z0000 Encounter for general adult medical examination without abnormal findings: Secondary | ICD-10-CM

## 2017-01-05 NOTE — Telephone Encounter (Signed)
Patient requested refill on paroxetine. Reviewing her chart, her last refill was about 9 months ago for 30 tablets without refills. So I suggested forewarning refill requests to her psychiatrist for this. I have refilled the rest of her medications.

## 2017-02-04 ENCOUNTER — Other Ambulatory Visit: Payer: Self-pay | Admitting: Internal Medicine

## 2017-03-10 ENCOUNTER — Other Ambulatory Visit: Payer: Self-pay | Admitting: Student

## 2017-04-06 ENCOUNTER — Other Ambulatory Visit (HOSPITAL_COMMUNITY)
Admission: RE | Admit: 2017-04-06 | Discharge: 2017-04-06 | Disposition: A | Discharge status: Home / Self Care | Payer: Medicare Other | Source: Ambulatory Visit | Attending: Family Medicine | Admitting: Family Medicine

## 2017-04-06 ENCOUNTER — Encounter: Payer: Self-pay | Admitting: Family Medicine

## 2017-04-06 ENCOUNTER — Ambulatory Visit (INDEPENDENT_AMBULATORY_CARE_PROVIDER_SITE_OTHER): Payer: Medicare Other | Admitting: Family Medicine

## 2017-04-06 VITALS — BP 128/84 | HR 93 | Temp 98.4°F | Ht 66.0 in | Wt 172.8 lb

## 2017-04-06 DIAGNOSIS — Z124 Encounter for screening for malignant neoplasm of cervix: Secondary | ICD-10-CM

## 2017-04-06 DIAGNOSIS — Z0001 Encounter for general adult medical examination with abnormal findings: Secondary | ICD-10-CM | POA: Diagnosis not present

## 2017-04-06 DIAGNOSIS — Z1231 Encounter for screening mammogram for malignant neoplasm of breast: Secondary | ICD-10-CM

## 2017-04-06 DIAGNOSIS — F17200 Nicotine dependence, unspecified, uncomplicated: Secondary | ICD-10-CM

## 2017-04-06 DIAGNOSIS — Z0000 Encounter for general adult medical examination without abnormal findings: Secondary | ICD-10-CM

## 2017-04-06 DIAGNOSIS — Z1211 Encounter for screening for malignant neoplasm of colon: Secondary | ICD-10-CM

## 2017-04-06 DIAGNOSIS — Z1239 Encounter for other screening for malignant neoplasm of breast: Secondary | ICD-10-CM

## 2017-04-06 MED ORDER — NYSTATIN-TRIAMCINOLONE 100000-0.1 UNIT/GM-% EX OINT: 1.0000 "application " | TOPICAL_OINTMENT | Freq: Two times a day (BID) | CUTANEOUS | 0 refills | Status: DC

## 2017-04-06 NOTE — Assessment & Plan Note (Signed)
  Patient is daily smoker. Has asthma. Wheezing on exam. Uses albuterol inhaler and flovent.   -encouraged smoking cessation, hand out given -continue asthma medications -follow up as needed

## 2017-04-06 NOTE — Progress Notes (Signed)
    Subjective:    Patient ID: Jean Williams, female    DOB: [birth year], 56 y.o.   MRN: [id number]   CC: here for physical exam and pap  Reports she is doing well. Has a rash on crook of elbow that has been there for a while. It is itchy. No discharge. Not painful. She reports she can't use steroid creams because they "make her crazy"  She wants to cut down on Clozaril dose. Sees psych monthly. Reports her sister who is legal guardian won't let her decrease this dose. Reports she has taken only 1 pill every night instead of 2. Reports feeling better with doing this. Denies SI/HI. Endorses anxiety.   Screening: unsure when she had her colonoscopy last but it was "a long time ago". Had mammogram over a year ago and is due for this. She does not have a dentist. She has not seen an eye doctor in a long time. She is here today for pap smear.   Smoking status reviewed- current every day smoker  Review of Systems   Objective:  BP 128/84   Pulse 93   Temp 98.4 F (36.9 C) (Oral)   Ht 5\' 6"  (1.676 m)   Wt 172 lb 12.8 oz (78.4 kg)   LMP 06/10/2011   SpO2 93%   BMI 27.89 kg/m  Vitals and nursing note reviewed  General: well nourished, in no acute distress HEENT: normocephalic, no scleral icterus or conjunctival pallor, no nasal discharge, moist mucous membranes, good dentition without erythema or discharge noted in posterior oropharynx Neck: supple, non-tender, without lymphadenopathy Cardiac: RRR, clear S1 and S2, no murmurs, rubs, or gallops Respiratory: wheezes auscultated bilaterally, decreased air movement. No increased work of breathing. Abdomen: soft, nontender, nondistended, no masses or organomegaly. Bowel sounds present GU: normal female external genitalia, moderate amount of white discharge present in vaginal canal Extremities: no edema or cyanosis. Skin: warm and dry, no rashes noted Neuro: alert and oriented, no focal deficits   Assessment & Plan:    Health care  maintenance  -pap smear done today -referral for GI made for colonoscopy -referral for mammogram placed -return in 1 year or as needed   TOBACCO DEPENDENCE  Patient is daily smoker. Has asthma. Wheezing on exam. Uses albuterol inhaler and flovent.   -encouraged smoking cessation, hand out given -continue asthma medications -follow up as needed     Return in about 1 year (around 04/06/2018).   Lucila Maine, DO Family Medicine Resident PGY-2

## 2017-04-06 NOTE — Assessment & Plan Note (Signed)
-  pap smear done today -referral for GI made for colonoscopy -referral for mammogram placed -return in 1 year or as needed

## 2017-04-06 NOTE — Patient Instructions (Signed)
It was nice to meet you today.  Please get your mammogram scheduled. We will call you with your pap smear results. Please use fungal cream to rash twice a day.   If you have questions or concerns please do not hesitate to call at [phone].  Lucila Maine, DO PGY-2, [facility] 04/06/2017 9:27 AM     Steps to Quit Smoking Smoking tobacco can be bad for your health. It can also affect almost every organ in your body. Smoking puts you and people around you at risk for many serious long-lasting (chronic) diseases. Quitting smoking is hard, but it is one of the best things that you can do for your health. It is never too late to quit. What are the benefits of quitting smoking? When you quit smoking, you lower your risk for getting serious diseases and conditions. They can include:  Lung cancer or lung disease.  Heart disease.  Stroke.  Heart attack.  Not being able to have children (infertility).  Weak bones (osteoporosis) and broken bones (fractures).  If you have coughing, wheezing, and shortness of breath, those symptoms may get better when you quit. You may also get sick less often. If you are pregnant, quitting smoking can help to lower your chances of having a baby of low birth weight. What can I do to help me quit smoking? Talk with your doctor about what can help you quit smoking. Some things you can do (strategies) include:  Quitting smoking totally, instead of slowly cutting back how much you smoke over a period of time.  Going to in-person counseling. You are more likely to quit if you go to many counseling sessions.  Using resources and support systems, such as: ? Database administrator with a Social worker. ? Phone quitlines. ? Careers information officer. ? Support groups or group counseling. ? Text messaging programs. ? Mobile phone apps or applications.  Taking medicines. Some of these medicines may have nicotine in them. If you are pregnant or  breastfeeding, do not take any medicines to quit smoking unless your doctor says it is okay. Talk with your doctor about counseling or other things that can help you.  Talk with your doctor about using more than one strategy at the same time, such as taking medicines while you are also going to in-person counseling. This can help make quitting easier. What things can I do to make it easier to quit? Quitting smoking might feel very hard at first, but there is a lot that you can do to make it easier. Take these steps:  Talk to your family and friends. Ask them to support and encourage you.  Call phone quitlines, reach out to support groups, or work with a Social worker.  Ask people who smoke to not smoke around you.  Avoid places that make you want (trigger) to smoke, such as: ? Bars. ? Parties. ? Smoke-break areas at work.  Spend time with people who do not smoke.  Lower the stress in your life. Stress can make you want to smoke. Try these things to help your stress: ? Getting regular exercise. ? Deep-breathing exercises. ? Yoga. ? Meditating. ? Doing a body scan. To do this, close your eyes, focus on one area of your body at a time from head to toe, and notice which parts of your body are tense. Try to relax the muscles in those areas.  Download or buy apps on your mobile phone or tablet that can help you stick to your  quit plan. There are many free apps, such as QuitGuide from the [employer] Office manager for Disease Control and Prevention). You can find more support from smokefree.gov and other websites.  This information is not intended to replace advice given to you by your health care provider. Make sure you discuss any questions you have with your health care provider. Document Released: 04/19/2009 Document Revised: 02/19/2016 Document Reviewed: 11/07/2014 Elsevier Interactive Patient Education  2018 [employer].

## 2017-04-07 ENCOUNTER — Telehealth: Payer: Self-pay

## 2017-04-07 ENCOUNTER — Encounter: Payer: Self-pay | Admitting: Gastroenterology

## 2017-04-07 ENCOUNTER — Other Ambulatory Visit: Payer: Self-pay | Admitting: Internal Medicine

## 2017-04-07 MED ORDER — ATORVASTATIN CALCIUM 40 MG PO TABS: 40.0000 mg | ORAL_TABLET | Freq: Every day | ORAL | 0 refills | Status: DC

## 2017-04-07 NOTE — Telephone Encounter (Signed)
The medicine for fungal cream needs to be changed to something that her insurance will cover.Jean Williams

## 2017-04-07 NOTE — Telephone Encounter (Signed)
Will forward to Dr. Vanetta Shawl, who saw her in clinic yesterday and prescribed the cream.

## 2017-04-08 LAB — CYTOLOGY - PAP
ADEQUACY: ABSENT
Diagnosis: NEGATIVE
HPV: NOT DETECTED

## 2017-04-09 ENCOUNTER — Encounter: Payer: Medicare Other | Admitting: Internal Medicine

## 2017-04-09 ENCOUNTER — Other Ambulatory Visit: Payer: Self-pay | Admitting: Family Medicine

## 2017-04-09 MED ORDER — NYSTATIN 100000 UNIT/GM EX OINT: 1.0000 "application " | TOPICAL_OINTMENT | Freq: Two times a day (BID) | CUTANEOUS | 0 refills | Status: DC

## 2017-04-09 NOTE — Telephone Encounter (Signed)
Caregiver calls again to check the status of this request.

## 2017-04-09 NOTE — Telephone Encounter (Signed)
New prescription sent in. No number to contact caregiver. Called patient to inform her new prescription sent in, no answer, left message. Thanks.

## 2017-04-10 ENCOUNTER — Telehealth: Payer: Self-pay

## 2017-04-10 ENCOUNTER — Other Ambulatory Visit: Payer: Self-pay | Admitting: Family Medicine

## 2017-04-10 MED ORDER — METRONIDAZOLE 500 MG PO TABS: 500.0000 mg | ORAL_TABLET | Freq: Two times a day (BID) | ORAL | 0 refills | Status: AC

## 2017-04-10 NOTE — Progress Notes (Unsigned)
  Sent in rx for patient for BV which was present on her pap results. Pap negative for pre-cancerous changes.   Lucila Maine, DO PGY-2, [facility] 04/10/2017 2:27 PM

## 2017-04-10 NOTE — Telephone Encounter (Signed)
Jean Rhyme, DO  [facility] 1 hour ago (2:28 PM)    Please inform patient medicine for BV sent to pharmacy- I was unable to reach her via phone.  (Routing comment)     Steve Rattler, DO 1 hour ago (2:27 PM)       Sent in rx for patient for BV which was present on her pap results. Pap negative for pre-cancerous changes.   Lucila Maine, DO PGY-2, [facility] 04/10/2017 2:27 PM     Unsigned

## 2017-04-28 ENCOUNTER — Ambulatory Visit
Admission: RE | Admit: 2017-04-28 | Discharge: 2017-04-28 | Disposition: A | Discharge status: Home / Self Care | Payer: Medicare Other | Source: Ambulatory Visit | Attending: Family Medicine | Admitting: Family Medicine

## 2017-04-28 DIAGNOSIS — Z1239 Encounter for other screening for malignant neoplasm of breast: Secondary | ICD-10-CM

## 2017-04-28 IMAGING — MG DIGITAL SCREENING BILATERAL MAMMOGRAM WITH CAD
4 series · 4 of 4 positions shown · non-contrast
Comparison: Previous exam(s).

CLINICAL DATA: Screening.

EXAM:
DIGITAL SCREENING BILATERAL MAMMOGRAM WITH CAD

[L MLO]
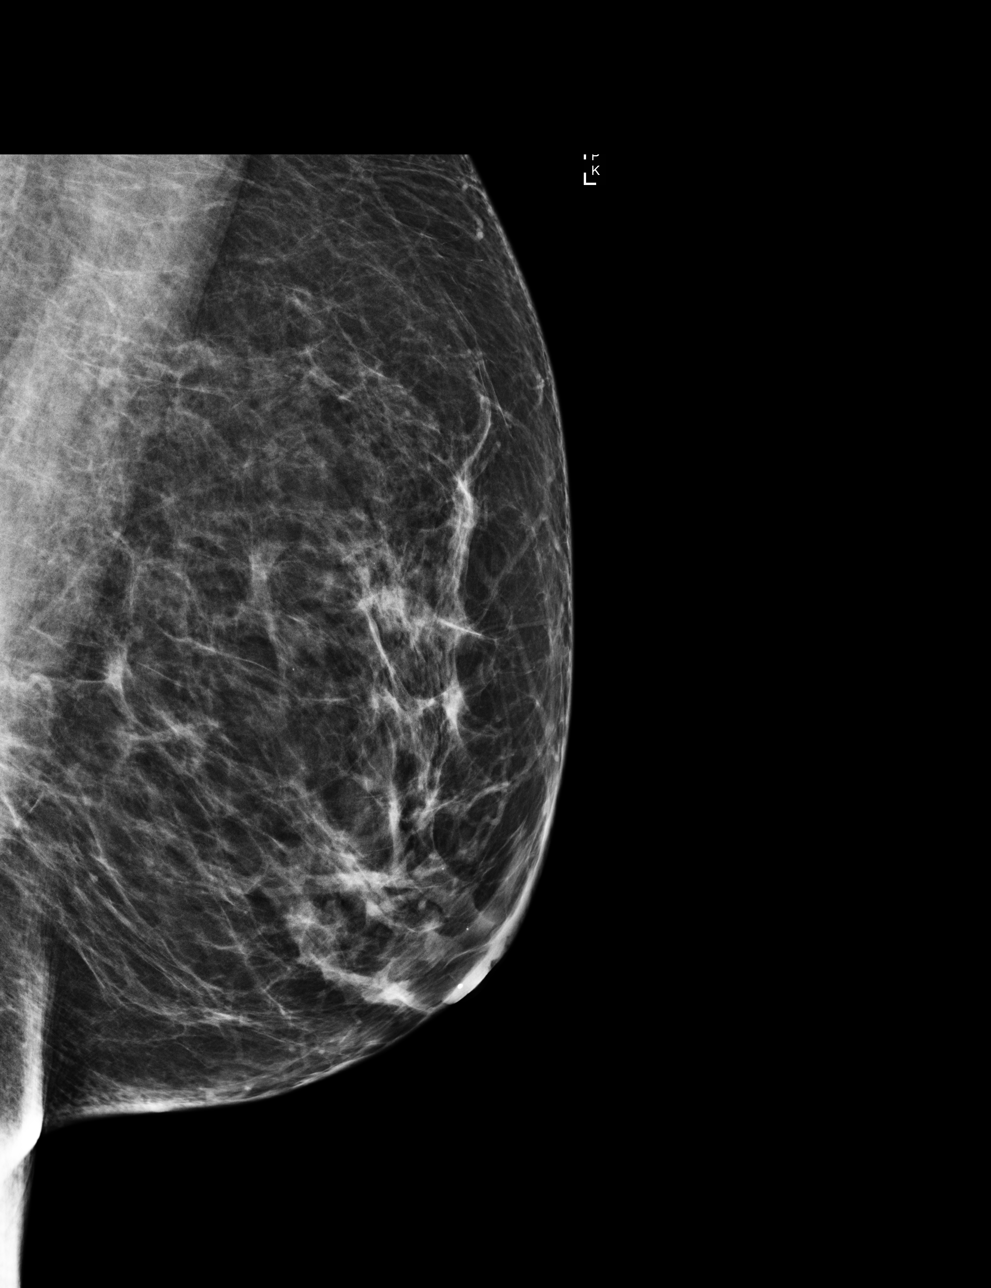

[R MLO]
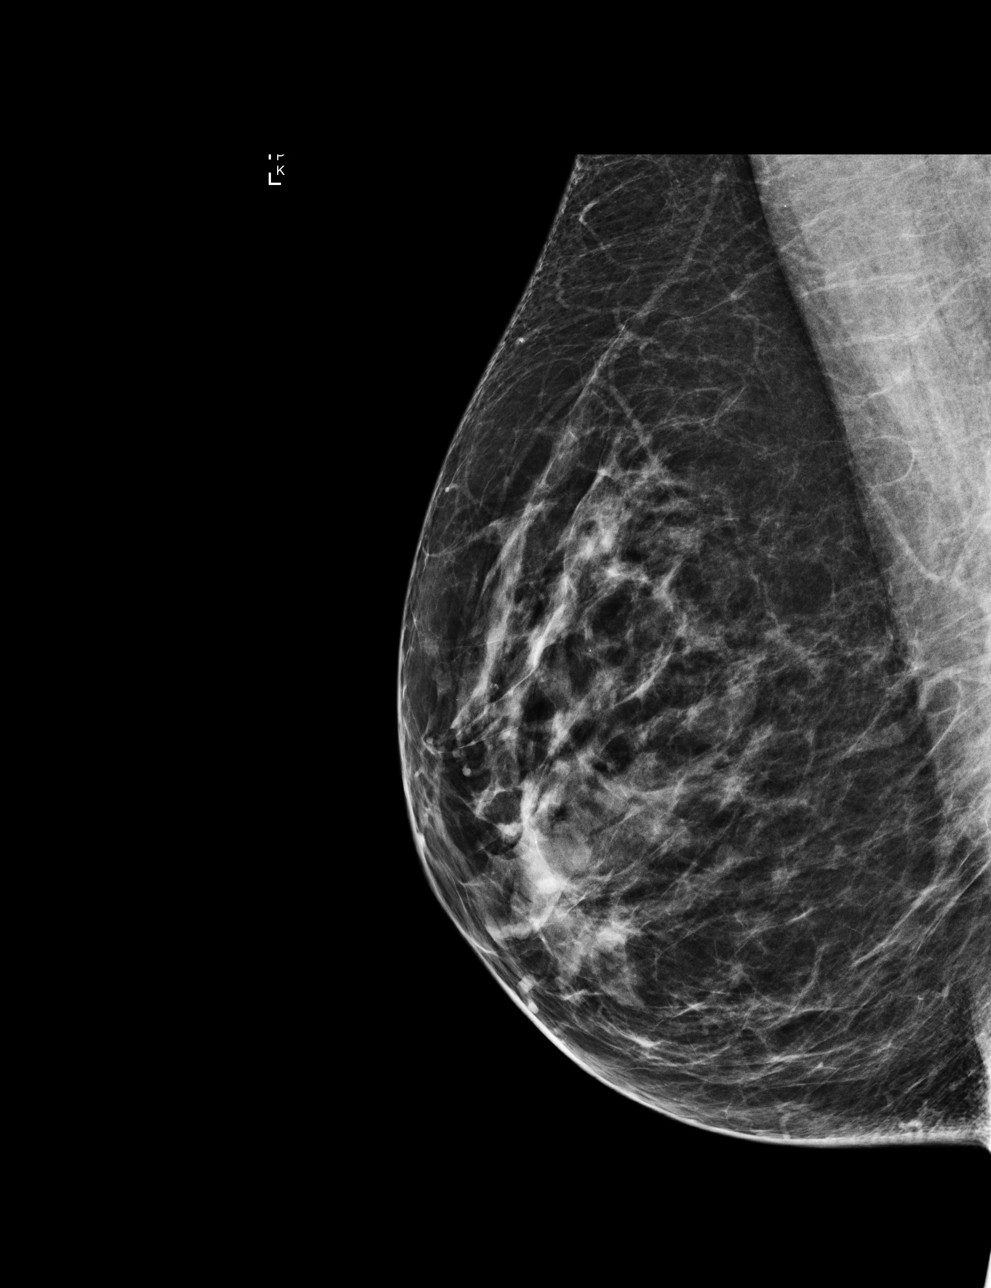

[L CC]
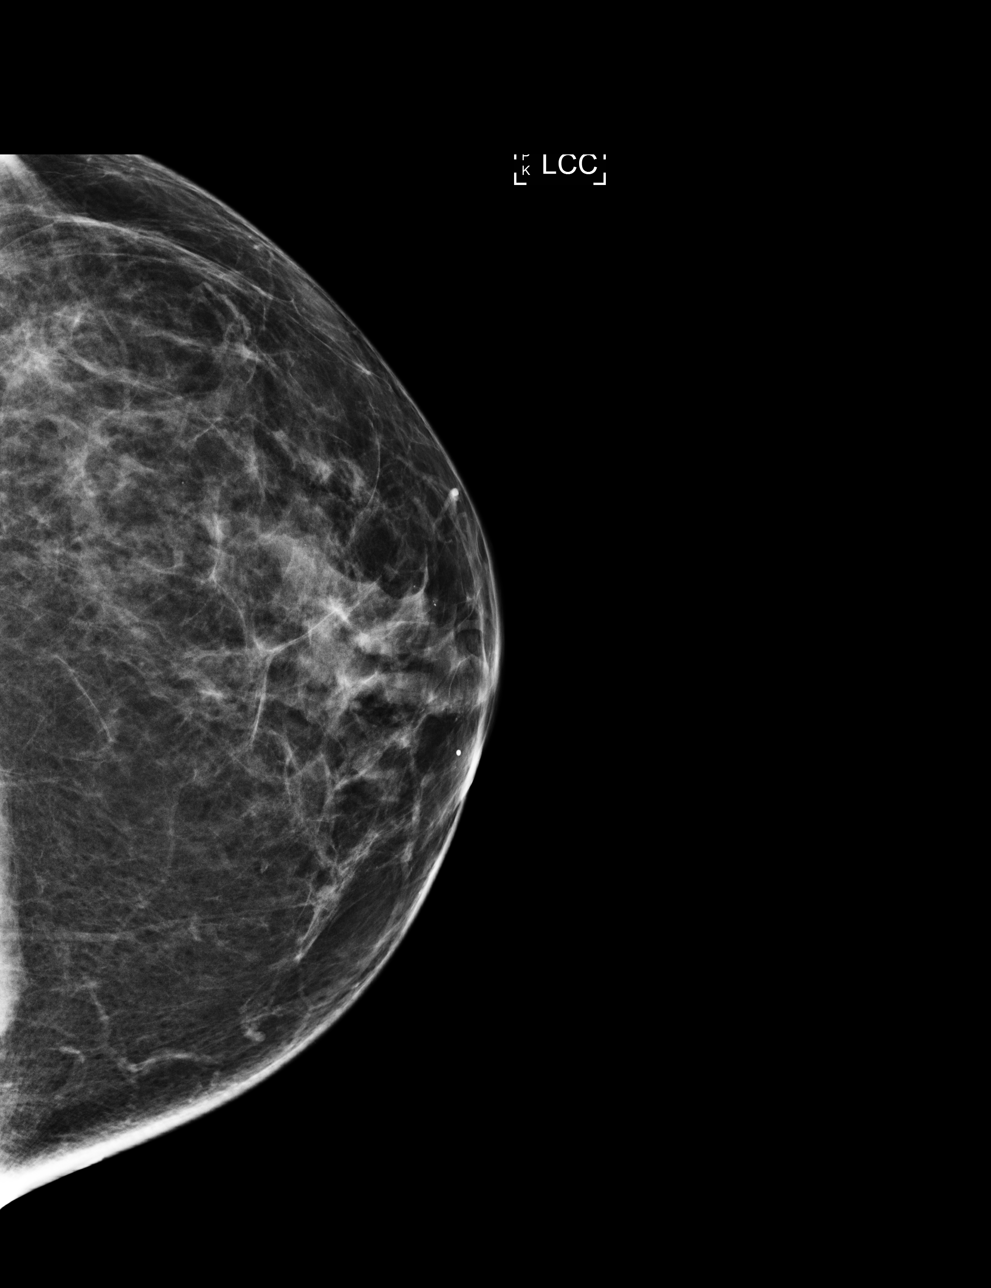

[R CC]
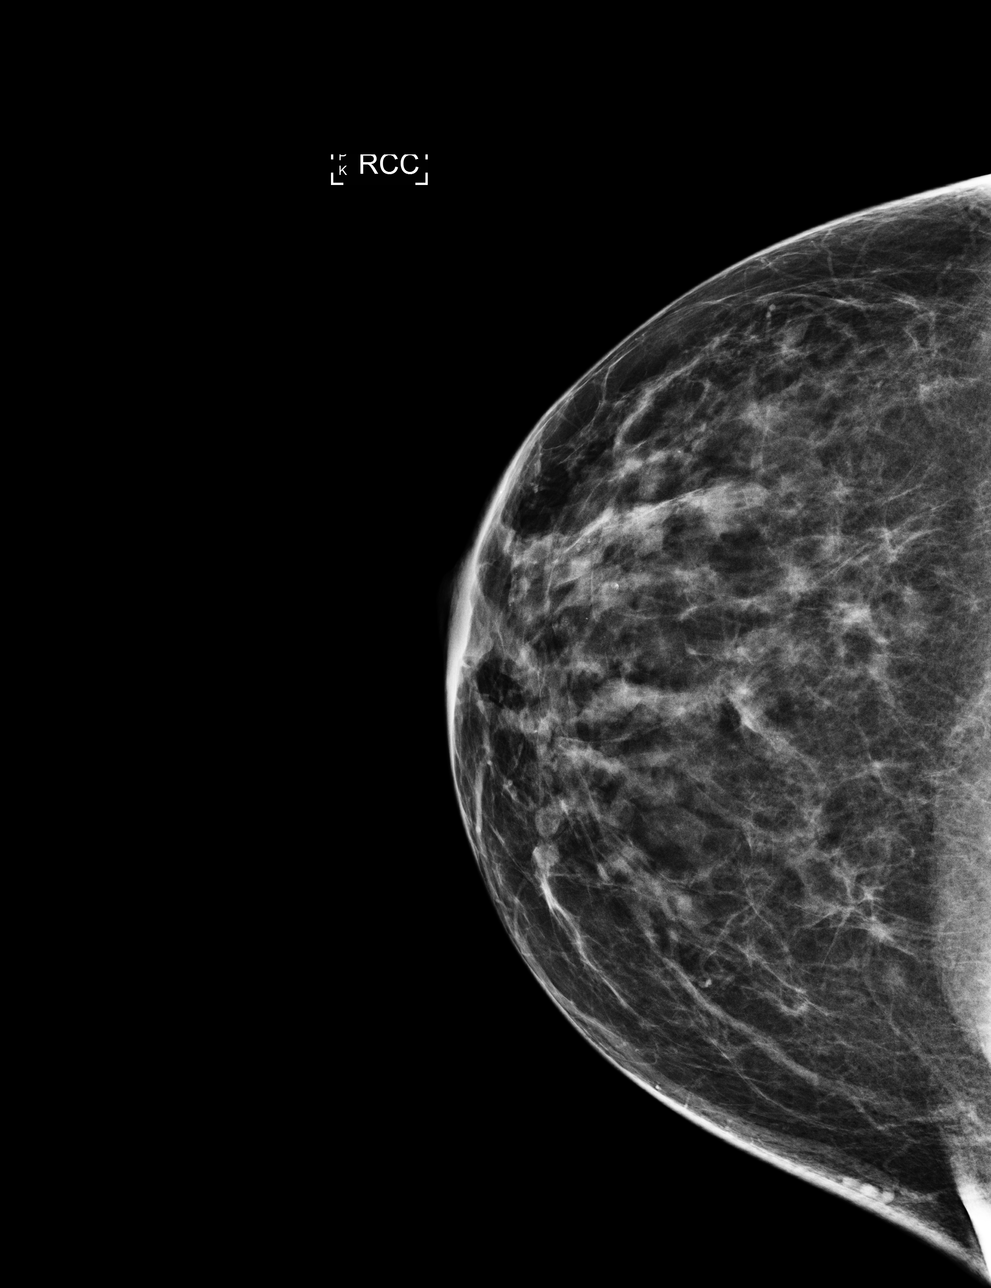

[4 of 4 positions shown; findings below may reference images not displayed]

ACR Breast Density Category b: There are scattered areas of
fibroglandular density.
FINDINGS: There are no findings suspicious for malignancy. Images were
processed with CAD.
IMPRESSION: No mammographic evidence of malignancy. A result letter of this
screening mammogram will be mailed directly to the patient.

RECOMMENDATION:
Screening mammogram in one year. (Code:AS-G-LCT)

BI-RADS CATEGORY  1: Negative.

## 2017-04-29 ENCOUNTER — Encounter (HOSPITAL_COMMUNITY): Payer: Self-pay | Admitting: Emergency Medicine

## 2017-04-29 ENCOUNTER — Emergency Department (HOSPITAL_COMMUNITY): Payer: Medicare Other

## 2017-04-29 ENCOUNTER — Emergency Department (HOSPITAL_COMMUNITY)
Admission: EM | Admit: 2017-04-29 | Discharge: 2017-04-29 | Disposition: A | Discharge status: Home / Self Care | Payer: Medicare Other | Attending: Emergency Medicine | Admitting: Emergency Medicine

## 2017-04-29 DIAGNOSIS — Y9301 Activity, walking, marching and hiking: Secondary | ICD-10-CM | POA: Diagnosis not present

## 2017-04-29 DIAGNOSIS — J45909 Unspecified asthma, uncomplicated: Secondary | ICD-10-CM | POA: Insufficient documentation

## 2017-04-29 DIAGNOSIS — F17210 Nicotine dependence, cigarettes, uncomplicated: Secondary | ICD-10-CM | POA: Insufficient documentation

## 2017-04-29 DIAGNOSIS — Y929 Unspecified place or not applicable: Secondary | ICD-10-CM | POA: Insufficient documentation

## 2017-04-29 DIAGNOSIS — W1789XA Other fall from one level to another, initial encounter: Secondary | ICD-10-CM | POA: Insufficient documentation

## 2017-04-29 DIAGNOSIS — S0003XA Contusion of scalp, initial encounter: Secondary | ICD-10-CM | POA: Insufficient documentation

## 2017-04-29 DIAGNOSIS — S0990XA Unspecified injury of head, initial encounter: Secondary | ICD-10-CM | POA: Diagnosis present

## 2017-04-29 DIAGNOSIS — Z79899 Other long term (current) drug therapy: Secondary | ICD-10-CM | POA: Diagnosis not present

## 2017-04-29 DIAGNOSIS — W19XXXA Unspecified fall, initial encounter: Secondary | ICD-10-CM

## 2017-04-29 DIAGNOSIS — Y999 Unspecified external cause status: Secondary | ICD-10-CM | POA: Insufficient documentation

## 2017-04-29 DIAGNOSIS — Z85828 Personal history of other malignant neoplasm of skin: Secondary | ICD-10-CM | POA: Diagnosis not present

## 2017-04-29 HISTORY — DX: Schizophrenia, unspecified: F20.9

## 2017-04-29 HISTORY — DX: Unspecified asthma, uncomplicated: J45.909

## 2017-04-29 IMAGING — CT CT CERVICAL SPINE W/O CM
5 of 8 series · 12 of 33 positions shown, 13 images · non-contrast
Comparison: None.

CLINICAL DATA: Pain following fall

EXAM:
CT HEAD WITHOUT CONTRAST
CT CERVICAL SPINE WITHOUT CONTRAST
TECHNIQUE: Multidetector CT imaging of the head and cervical spine was
performed following the standard protocol without intravenous
contrast. Multiplanar CT image reconstructions of the cervical spine
were also generated.

[Series 5: head bone · axial · 0.50mm/px · z∈[-63,-7]mm · 2 of 85 slices shown]
[im 29/85  bone]
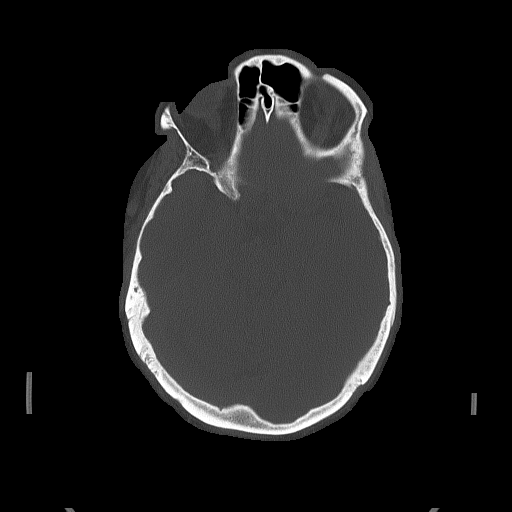
[im 57/85  bone]
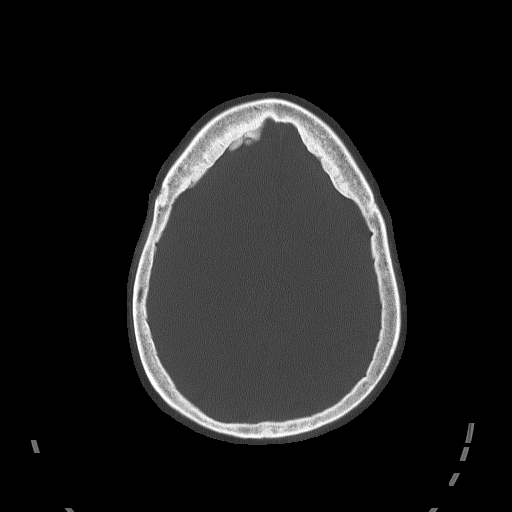

[Series 8: c_spine 2.0 st · axial · 0.37mm/px · z∈[-221,-155]mm · 2 of 99 slices shown, 3 images]
[im 33/99  soft-tissue]
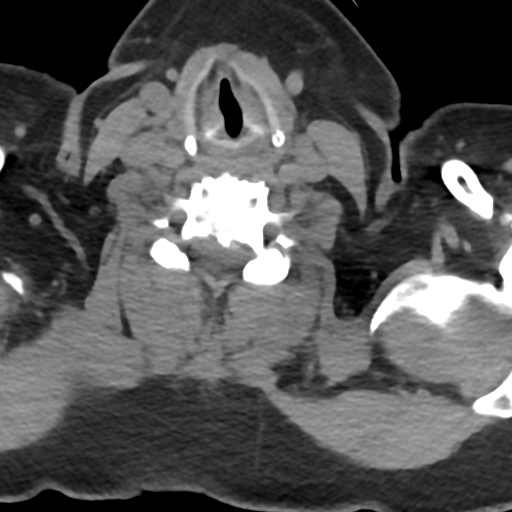
[im 33/99  bone]
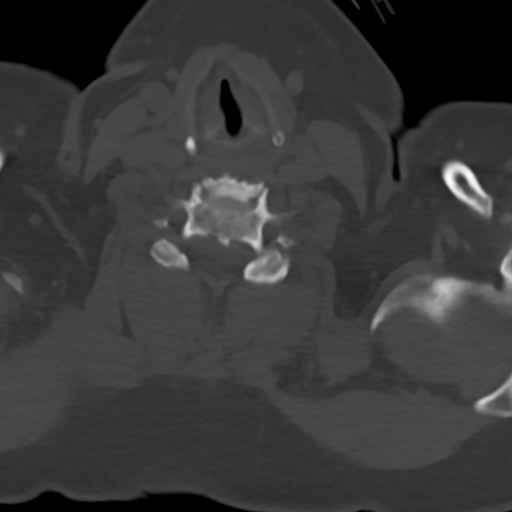
[im 66/99  bone]
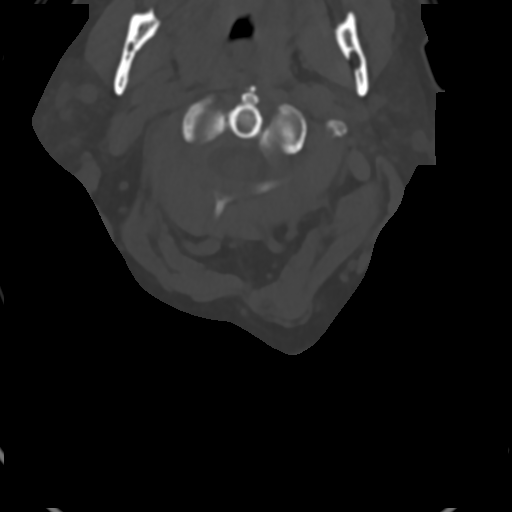

[Series 10: c_spine 2.0 sag bone · sagittal · 0.29mm/px · 5 of 61 slices shown]
[im 11/61  bone]
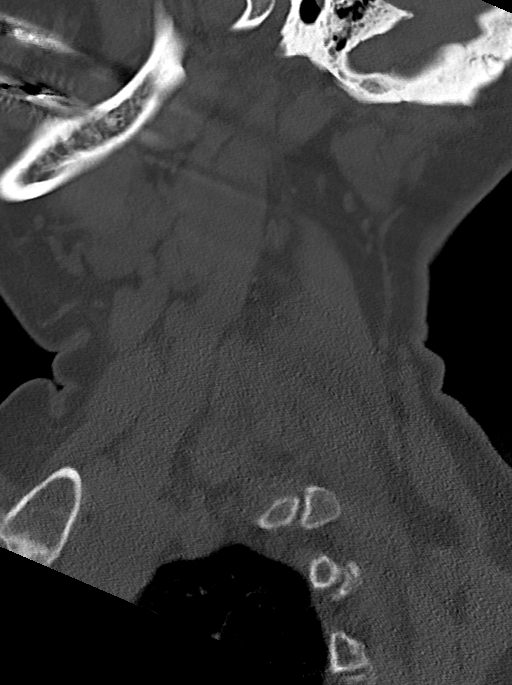
[im 21/61  bone]
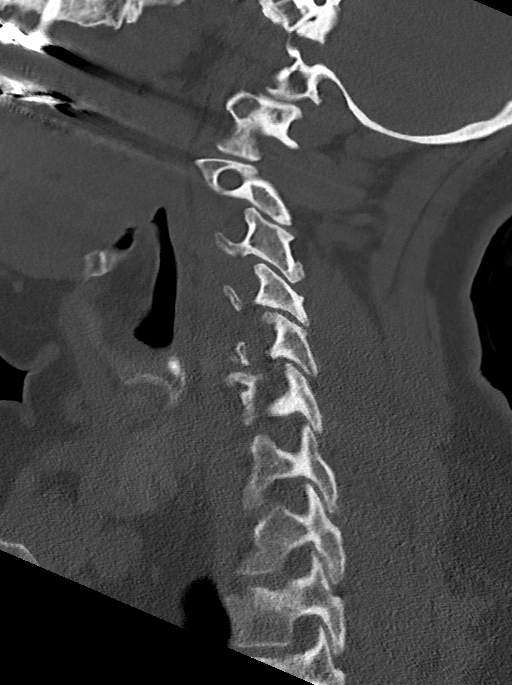
[im 31/61  bone]
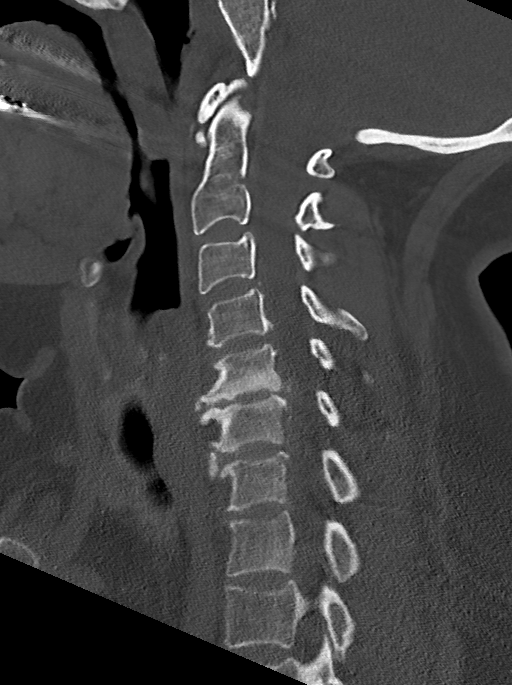
[im 41/61  bone]
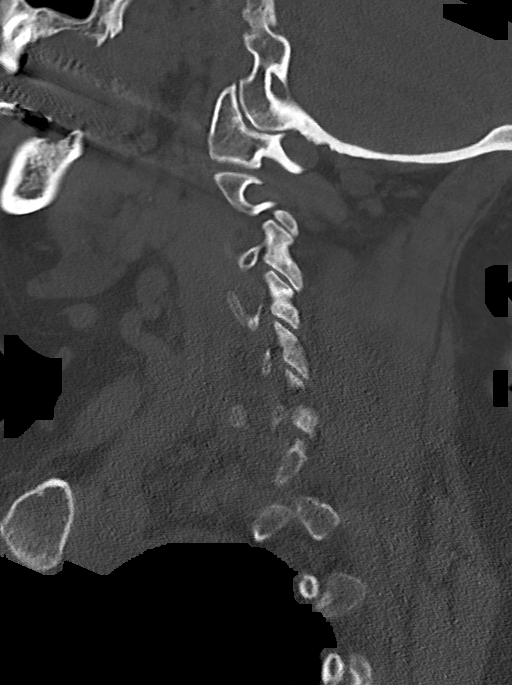
[im 51/61  bone]
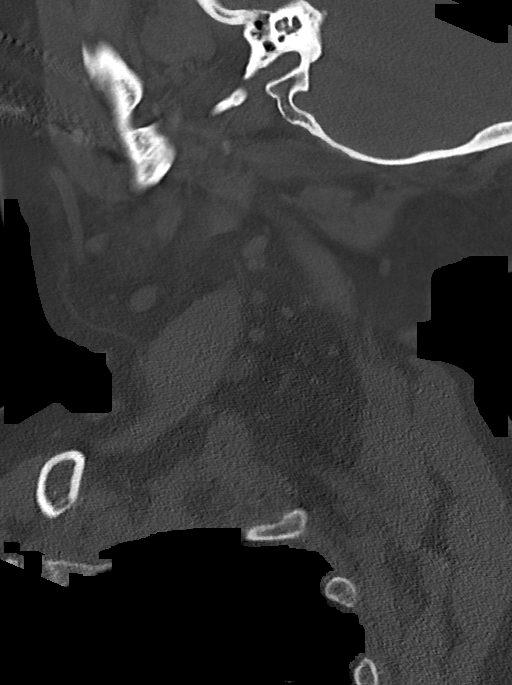

[Series 11: c_spine 2.0 cor bone · coronal · 0.29mm/px · 1 of 61 slices shown]
[im 31/61  bone]
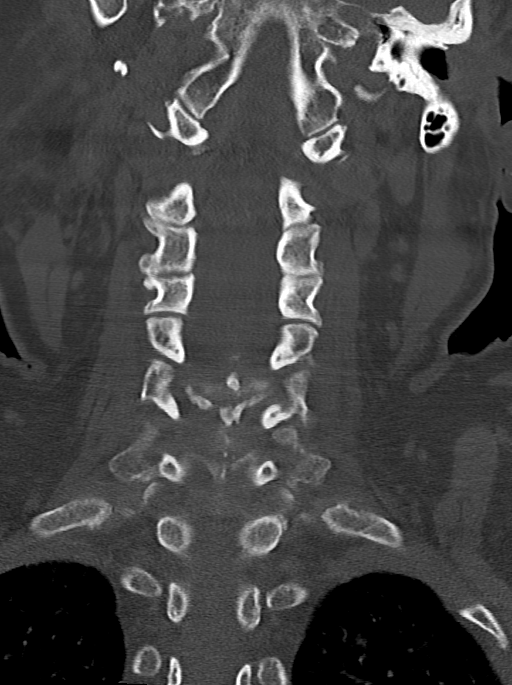

[Series 12: c_spine 2.0 orthogonals · axial · 0.21mm/px · z∈[-243,-198]mm · 2 of 91 slices shown]
[im 31/91  bone]
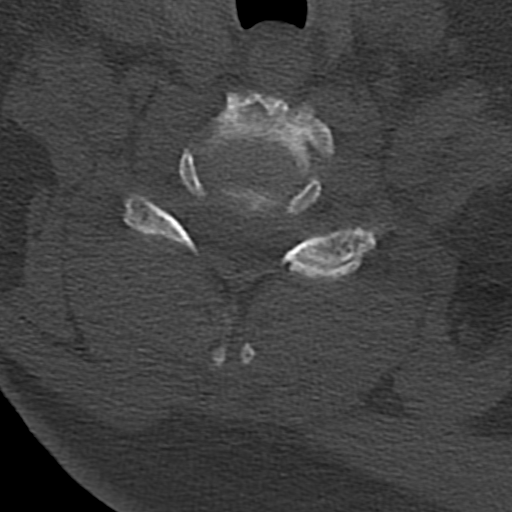
[im 61/91  bone]
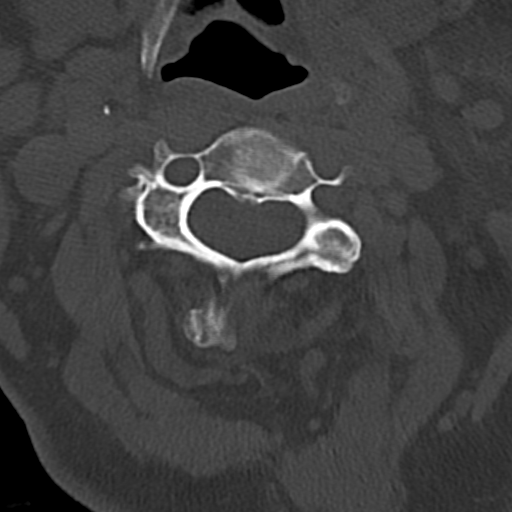

[12 of 33 positions shown; findings below may reference images not displayed]

FINDINGS: CT HEAD FINDINGS

Brain: The ventricles are normal in size and configuration. There is
no intracranial mass, hemorrhage, or extra-axial fluid collection,
or midline shift. There is slight small vessel disease in the centra
semiovale bilaterally. Elsewhere gray-white compartments appear
normal. No evident acute infarct.

Vascular: There is no appreciable hyperdense vessel. There is no
appreciable arterial vascular calcification.

Skull: Bony calvarium appears intact. There is a small left frontal
exostosis, benign in appearance.

Sinuses/Orbits: There is mucosal thickening in several ethmoid air
cells bilaterally. There is a tiny retention cyst in the posterior
right maxillary antrum. Other visualized paranasal sinuses are
clear. Orbits appear symmetric bilaterally.

Other: Mastoid air cells are clear on the left. There is
opacification of multiple mastoid air cells on the right.

CT CERVICAL SPINE FINDINGS

Alignment: There is no appreciable spondylolisthesis.

Skull base and vertebrae: Skull base and craniocervical junction
regions appear normal. There is no evident acute fracture. No
blastic or lytic bone lesions are evident.

Soft tissues and spinal canal: Prevertebral soft tissues and
predental space regions are normal. There are no paraspinous
lesions. No cord or canal hematoma.

Disc levels: There is moderately severe disc space narrowing at
C5-6. There is milder disc space narrowing at C4-5 and C6-7. There
are prominent anterior osteophytes at C5 and C6 with a somewhat
smaller anterior osteophyte at C4. There is facet hypertrophy at
several levels. There is exit foraminal narrowing due to bony
hypertrophy at C5-6 on the left. No frank disc extrusion or stenosis
evident.

Upper chest: Visualized upper lung zones clear.

Other: There is slight calcification in the right carotid artery.
IMPRESSION: CT head: Slight periventricular small vessel disease. No
intracranial mass, hemorrhage, or extra-axial fluid collection. No
acute infarct evident.

Areas of paranasal sinus disease noted. There is right-sided mastoid
air cell disease.

CT cervical spine: No fracture or spondylolisthesis. Areas of
osteoarthritic change, most notably at C5-6. Small focus of
calcification in right carotid artery.

## 2017-04-29 MED ORDER — ACETAMINOPHEN 325 MG PO TABS
650.0000 mg | ORAL_TABLET | Freq: Once | ORAL | Status: AC
  Administered 2017-04-29: 650 mg via ORAL
  Filled 2017-04-29: qty 2

## 2017-04-29 NOTE — ED Provider Notes (Signed)
[facility] EMERGENCY DEPARTMENT Provider Note   CSN: [id number] Arrival date & time: 04/29/17  0901     History   Chief Complaint Chief Complaint  Patient presents with  . Fall    HPI Jean Williams is a 56 y.o. female.  The history is provided by the patient and the EMS personnel. No language interpreter was used.  Fall    Jean Williams is a 56 y.o. female who presents to the Emergency Department complaining of fall.  She presents by EMS for evaluation of injuries following a fall.  She was standing on an incline driveway when she states she felt anxious, which is typical for her.  She lost her balance and fell backwards.  Part of her anxiety is feeling off balance and like she will fall.  She did strike her head and reports some posterior headache and mild neck pain.  No loss of consciousness.  On arrival to the emergency department she does report some mild ongoing posterior headache.  No nausea, vomiting, numbness, weakness.  Symptoms are mild and improving in nature. Past Medical History:  Diagnosis Date  . Asthma   . 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 Basal cell carcinoma, lip 05/13/2011   Left upper lip. Treated with Mohs micrographic surgery by Dr. Tonita Cong MD. On 08/22/11.    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 Benign per Derm. Dr. Lajean Saver.    . Schizophrenia [facility])     Patient Active Problem List   Diagnosis Date Noted  . Rash and nonspecific skin eruption 03/21/2016  . Health care maintenance 05/29/2014  . Lives in group home 12/27/2012  . Mixed hyperlipidemia 04/19/2009  . MAJOR DPRSV DISORDER RECURRENT EPISODE UNSPEC 01/16/2009  . UNSPECIFIED TACHYCARDIA 01/09/2009  . ASTHMA, PERSISTENT 02/03/2008  . Schizophrenia, unspecified type ([place]) 09/03/2006  . PANIC ATTACKS 09/03/2006  . TOBACCO DEPENDENCE 09/03/2006  . DYSPEPSIA 09/03/2006  . Rosacea  09/03/2006    History reviewed. No pertinent surgical history.  OB History    No data available       Home Medications    Prior to Admission medications   Medication Sig Start Date End Date Taking? Authorizing Provider  acetaminophen (TYLENOL) 325 MG tablet 2 tablets by mouth every 4 hours as need for pain/fever.    [provider]  albuterol (PROVENTIL HFA;VENTOLIN HFA) 108 (90 Base) MCG/ACT inhaler Inhale 1-2 puffs into the lungs every 6 (six) hours as needed for wheezing or shortness of breath. 10/01/15   Vivi Barrack, MD  Artificial Tear Ointment (ARTIFICIAL TEARS) ointment Place into both eyes as needed. Keep at bedside 11/30/14   Hilton Sinclair, MD  atorvastatin (LIPITOR) 40 MG tablet Take 1 tablet (40 mg total) by mouth at bedtime. 04/07/17   Mayo, Pete Pelt, MD  busPIRone (BUSPAR) 15 MG tablet Take 1 tablet (15 mg total) by mouth 3 (three) times daily. 07/18/16   Mayo, Pete Pelt, MD  calcium-vitamin D (OSCAL WITH D) 500-200 MG-UNIT tablet TAKE 1 TABLET BY MOUTH TWICE DAILY. 01/05/17   Mercy Riding, MD  carbamide peroxide (DEBROX) 6.5 % otic solution Place 10 drops into both ears 2 (two) times daily. Dispense 1 bottle 11/30/14   Hilton Sinclair, MD  clobetasol cream (TEMOVATE) 5.78 % Apply 1 application topically 2 (two) times daily. 04/28/16   Mayo, Pete Pelt, MD  cloZAPine (CLOZARIL) 100  MG tablet Take 1 tablet (100 mg total) by mouth 2 (two) times daily. Take 1/2 tab every am & 3 tabs every evening 01/10/11   Funches, Josalyn, MD  DOK 100 MG capsule TAKE 1 CAPSULE BY MOUTH TWICE DAILY. 01/05/17   Mercy Riding, MD  famotidine (PEPCID) 40 MG tablet TAKE 1/2 TABLET (=TO A 20MG  DOSE) BY MOUTH TWICE DAILY. (BEFORE MEALS) 01/05/17   Mercy Riding, MD  fluticasone (FLONASE) 50 MCG/ACT nasal spray INAHALE 2 SPRAYS INTO EACH NOSTRIL ONCE DAILY. 01/05/17   Mercy Riding, MD  fluticasone (FLOVENT HFA) 110 MCG/ACT inhaler INHALE 1 PUFF TWICE      DAILY. 10/06/16   Mayo, Pete Pelt,  MD  hydrocortisone 2.5 % cream Apply topically 2 (two) times daily. 04/14/16   Haney, Alyssa A, MD  ipratropium (ATROVENT) 0.06 % nasal spray USE 1 SPRAY IN EACH NOTRIL THREE TIMES DAILY AS DIRECTED. 03/10/17   Mayo, Pete Pelt, MD  loratadine (CLARITIN) 10 MG tablet TAKE 1 TABLET BY MOUTH ONCE DAILY. 01/05/17   Mercy Riding, MD  Multiple Vitamins-Minerals (CERTAVITE/ANTIOXIDANTS) TABS TAKE 1 TABLET BY MOUTH ONCE DAILY. REPLACES THERATRUM 01/05/17   Mercy Riding, MD  neomycin-colistin-hydrocortisone-thonzonium (CORTISPORIN-TC) 3.09-06-08-0.5 MG/ML otic suspension Place 3 drops into both ears 4 (four) times daily. 12/30/13   Hilton Sinclair, MD  nystatin ointment (MYCOSTATIN) Apply 1 application topically 2 (two) times daily. 04/09/17   Steve Rattler, DO  nystatin-triamcinolone ointment (MYCOLOG) Apply 1 application topically 2 (two) times daily. 04/06/17   Riccio, Angela C, DO  OPTIVE 0.5-0.9 % SOLN INSTILL 1 DROP INTO BOTH EYES 4 TIMES DAILY. MAY SELF-ADMINISTER. PATIENT MAY KEEP AT BEDSIDE. 02/04/17   Mayo, Pete Pelt, MD    Family History Family History  Problem Relation Age of Onset  . Cancer Sister   . Cancer Maternal Grandmother     Social History Social History  Substance Use Topics  . Smoking status: Current Every Day Smoker    Packs/day: 1.00    Types: Cigarettes  . Smokeless tobacco: Never Used  . Alcohol use No     Allergies   Penicillins   Review of Systems Review of Systems  All other systems reviewed and are negative.    Physical Exam Updated Vital Signs BP (!) 152/95   Pulse 98   Temp 98 F (36.7 C) (Oral)   Resp 16   Ht 5\' 6"  (1.676 m)   Wt 77.1 kg (170 lb)   LMP 06/10/2011   SpO2 100%   BMI 27.44 kg/m   Physical Exam  Constitutional: She is oriented to person, place, and time. She appears well-developed and well-nourished.  HENT:  Head: Normocephalic and atraumatic.  Cardiovascular: Normal rate and regular rhythm.   No murmur  heard. Pulmonary/Chest: Effort normal and breath sounds normal. No respiratory distress.  Abdominal: Soft. There is no tenderness. There is no rebound and no guarding.  Musculoskeletal: She exhibits no edema.  Mild diffuse tenderness to the posterior neck.  Neurological: She is alert and oriented to person, place, and time.  5 out of 5 strength in all 4 extremities with sensation to light touch intact in all 4 extremities  Skin: Skin is warm and dry.  Psychiatric: She has a normal mood and affect. Her behavior is normal.  Nursing note and vitals reviewed.    ED Treatments / Results  Labs (all labs ordered are listed, but only abnormal results are displayed) Labs Reviewed - No data to display  EKG  EKG Interpretation None       Radiology Ct Head Wo Contrast  Result Date: 04/29/2017 CLINICAL DATA:  Pain following fall EXAM: CT HEAD WITHOUT CONTRAST CT CERVICAL SPINE WITHOUT CONTRAST TECHNIQUE: Multidetector CT imaging of the head and cervical spine was performed following the standard protocol without intravenous contrast. Multiplanar CT image reconstructions of the cervical spine were also generated. COMPARISON:  None. FINDINGS: CT HEAD FINDINGS Brain: The ventricles are normal in size and configuration. There is no intracranial mass, hemorrhage, or extra-axial fluid collection, or midline shift. There is slight small vessel disease in the centra semiovale bilaterally. Elsewhere gray-white compartments appear normal. No evident acute infarct. Vascular: There is no appreciable hyperdense vessel. There is no appreciable arterial vascular calcification. Skull: Bony calvarium appears intact. There is a small left frontal exostosis, benign in appearance. Sinuses/Orbits: There is mucosal thickening in several ethmoid air cells bilaterally. There is a tiny retention cyst in the posterior right maxillary antrum. Other visualized paranasal sinuses are clear. Orbits appear symmetric bilaterally.  Other: Mastoid air cells are clear on the left. There is opacification of multiple mastoid air cells on the right. CT CERVICAL SPINE FINDINGS Alignment: There is no appreciable spondylolisthesis. Skull base and vertebrae: Skull base and craniocervical junction regions appear normal. There is no evident acute fracture. No blastic or lytic bone lesions are evident. Soft tissues and spinal canal: Prevertebral soft tissues and predental space regions are normal. There are no paraspinous lesions. No cord or canal hematoma. Disc levels: There is moderately severe disc space narrowing at C5-6. There is milder disc space narrowing at C4-5 and C6-7. There are prominent anterior osteophytes at C5 and C6 with a somewhat smaller anterior osteophyte at C4. There is facet hypertrophy at several levels. There is exit foraminal narrowing due to bony hypertrophy at C5-6 on the left. No frank disc extrusion or stenosis evident. Upper chest: Visualized upper lung zones clear. Other: There is slight calcification in the right carotid artery. IMPRESSION: CT head: Slight periventricular small vessel disease. No intracranial mass, hemorrhage, or extra-axial fluid collection. No acute infarct evident. Areas of paranasal sinus disease noted. There is right-sided mastoid air cell disease. CT cervical spine: No fracture or spondylolisthesis. Areas of osteoarthritic change, most notably at C5-6. Small focus of calcification in right carotid artery. Electronically Signed   By: Lowella Grip III M.D.   On: 04/29/2017 10:23   Ct Cervical Spine Wo Contrast  Result Date: 04/29/2017 CLINICAL DATA:  Pain following fall EXAM: CT HEAD WITHOUT CONTRAST CT CERVICAL SPINE WITHOUT CONTRAST TECHNIQUE: Multidetector CT imaging of the head and cervical spine was performed following the standard protocol without intravenous contrast. Multiplanar CT image reconstructions of the cervical spine were also generated. COMPARISON:  None. FINDINGS: CT HEAD  FINDINGS Brain: The ventricles are normal in size and configuration. There is no intracranial mass, hemorrhage, or extra-axial fluid collection, or midline shift. There is slight small vessel disease in the centra semiovale bilaterally. Elsewhere gray-white compartments appear normal. No evident acute infarct. Vascular: There is no appreciable hyperdense vessel. There is no appreciable arterial vascular calcification. Skull: Bony calvarium appears intact. There is a small left frontal exostosis, benign in appearance. Sinuses/Orbits: There is mucosal thickening in several ethmoid air cells bilaterally. There is a tiny retention cyst in the posterior right maxillary antrum. Other visualized paranasal sinuses are clear. Orbits appear symmetric bilaterally. Other: Mastoid air cells are clear on the left. There is opacification of multiple mastoid air cells on the  right. CT CERVICAL SPINE FINDINGS Alignment: There is no appreciable spondylolisthesis. Skull base and vertebrae: Skull base and craniocervical junction regions appear normal. There is no evident acute fracture. No blastic or lytic bone lesions are evident. Soft tissues and spinal canal: Prevertebral soft tissues and predental space regions are normal. There are no paraspinous lesions. No cord or canal hematoma. Disc levels: There is moderately severe disc space narrowing at C5-6. There is milder disc space narrowing at C4-5 and C6-7. There are prominent anterior osteophytes at C5 and C6 with a somewhat smaller anterior osteophyte at C4. There is facet hypertrophy at several levels. There is exit foraminal narrowing due to bony hypertrophy at C5-6 on the left. No frank disc extrusion or stenosis evident. Upper chest: Visualized upper lung zones clear. Other: There is slight calcification in the right carotid artery. IMPRESSION: CT head: Slight periventricular small vessel disease. No intracranial mass, hemorrhage, or extra-axial fluid collection. No acute  infarct evident. Areas of paranasal sinus disease noted. There is right-sided mastoid air cell disease. CT cervical spine: No fracture or spondylolisthesis. Areas of osteoarthritic change, most notably at C5-6. Small focus of calcification in right carotid artery. Electronically Signed   By: Lowella Grip III M.D.   On: 04/29/2017 10:23   Mm Digital Screening Bilateral  Result Date: 04/28/2017 CLINICAL DATA:  Screening. EXAM: DIGITAL SCREENING BILATERAL MAMMOGRAM WITH CAD COMPARISON:  Previous exam(s). ACR Breast Density Category b: There are scattered areas of fibroglandular density. FINDINGS: There are no findings suspicious for malignancy. Images were processed with CAD. IMPRESSION: No mammographic evidence of malignancy. A result letter of this screening mammogram will be mailed directly to the patient. RECOMMENDATION: Screening mammogram in one year. (Code:SM-B-01Y) BI-RADS CATEGORY  1: Negative. Electronically Signed   By: Franki Cabot M.D.   On: 04/28/2017 12:48    Procedures Procedures (including critical care time)  Medications Ordered in ED Medications  acetaminophen (TYLENOL) tablet 650 mg (not administered)     Initial Impression / Assessment and Plan / ED Course  I have reviewed the triage vital signs and the nursing notes.  Pertinent labs & imaging results that were available during my care of the patient were reviewed by me and considered in my medical decision making (see chart for details).     Patient here for evaluation of injuries following a fall while feeling anxious.  These feelings or anxiety are common and frequent for her.  She is currently calm and cooperative in the emergency department.  Imaging is negative for acute intracranial abnormality.  Patient able to ambulate the department without any additional complaints.  Plan to DC home with outpatient follow-up and return precautions. Final Clinical Impressions(s) / ED Diagnoses   Final diagnoses:  Fall,  initial encounter  Contusion of scalp, initial encounter    New Prescriptions New Prescriptions   No medications on file     Quintella Reichert, MD 04/29/17 1051

## 2017-04-29 NOTE — ED Notes (Signed)
Patient waiting on [facility]. 

## 2017-04-29 NOTE — ED Notes (Signed)
Patient transported to CT 

## 2017-04-29 NOTE — ED Notes (Signed)
Got patient on the monitor did vitals patient is resting with call bell in reach 

## 2017-04-29 NOTE — ED Triage Notes (Signed)
Pt arrives from group home via [facility] reporting fall from standing, denies SOB, CP, anxiety, witbnessed fall back onto driveway. Pt denies LOC, CP.  No neuro deficits noted on arrival, resp e/u, NAD noted at this time.

## 2017-05-01 ENCOUNTER — Telehealth: Payer: Self-pay | Admitting: Internal Medicine

## 2017-05-01 ENCOUNTER — Encounter: Payer: Self-pay | Admitting: Family Medicine

## 2017-05-01 ENCOUNTER — Ambulatory Visit (INDEPENDENT_AMBULATORY_CARE_PROVIDER_SITE_OTHER): Payer: Medicare Other | Admitting: Family Medicine

## 2017-05-01 ENCOUNTER — Other Ambulatory Visit: Payer: Self-pay | Admitting: Family Medicine

## 2017-05-01 VITALS — BP 126/70 | HR 100 | Temp 98.4°F | Ht 66.0 in | Wt 175.0 lb

## 2017-05-01 DIAGNOSIS — L309 Dermatitis, unspecified: Secondary | ICD-10-CM | POA: Diagnosis not present

## 2017-05-01 DIAGNOSIS — W19XXXS Unspecified fall, sequela: Secondary | ICD-10-CM

## 2017-05-01 DIAGNOSIS — F410 Panic disorder [episodic paroxysmal anxiety] without agoraphobia: Secondary | ICD-10-CM

## 2017-05-01 MED ORDER — COLLOIDAL OATMEAL 1 % EX CREA: TOPICAL_CREAM | CUTANEOUS | 0 refills | Status: DC

## 2017-05-01 NOTE — Telephone Encounter (Signed)
I have placed the Eucerin rx in the front office for faxing. I did not prescribe Nystatin to patient. Please direct question regarding Nystatin to her PCP. Thanks.      needs instruction   Hartsell, Jazmin M, CMA  You Just now (4:36 PM)      Will forward to Dr. Gwendlyn Deutscher who saw patient this morning. Lyle       Documentation     Lowella Fairy C routed conversation to [employer] 5 minutes ago (4:32 PM)    Karlton Lemon, [place] C 8 minutes ago (4:28 PM)      Supervisor in group home:  Rx with instructions on how to use/ apply cream is needed.  It could be faxed to [phone].  If pt is to discontinue using nystatin, orders are needed for that.  If it is to be continued, another prescription is needed            Documentation     shada wilson [phone]  Roseanna Rainbow

## 2017-05-01 NOTE — Patient Instructions (Signed)
Please use Eucerin for your rash

## 2017-05-01 NOTE — Telephone Encounter (Addendum)
I have placed the Eucerin rx in the front office for faxing. I did not prescribe Nystatin to patient. Please direct question regarding Nystatin to her PCP. Thanks.      needs instruction   Hartsell, Jazmin M, CMA  You Just now (4:36 PM)      Will forward to Dr. Gwendlyn Deutscher who saw patient this morning. Christine       Documentation     Lowella Fairy C routed conversation to [employer] 5 minutes ago (4:32 PM)    Karlton Lemon, [place] C 8 minutes ago (4:28 PM)      Supervisor in group home:  Rx with instructions on how to use/ apply cream is needed.  It could be faxed to [phone].  If pt is to discontinue using nystatin, orders are needed for that.  If it is to be continued, another prescription is needed            Documentation     shada wilson [phone]  Roseanna Rainbow

## 2017-05-01 NOTE — Telephone Encounter (Signed)
Will forward note to PCP to handle on Monday regarding nystatin for patient. Jazmin Hartsell,CMA

## 2017-05-01 NOTE — Telephone Encounter (Signed)
Will forward to Dr. Gwendlyn Deutscher who saw patient this morning. Generoso Cropper,CMA

## 2017-05-01 NOTE — Progress Notes (Signed)
Subjective:     Patient ID: [name], female   DOB: 12/26/1960, 56 y.o.   MRN: [id number]  HPI Fall:Patient stated whenever she is anxious or having panic attack she usually falls. Two days ago while using a bamboo stick to stabilize herself, she felt anxious and her Bamboo stick gave way and she fell. She was evaluated at the ED. Since then she had not been back to work. She sorks at [employer] for 15 yrs. She has a lot of absences. She is a dinning room attendant. C/O mild pain at the back of her head but she is better now. For panics she sees Dr. Ciro Backer Psych, she goes monthly and was seen a week ago. Denies any new concern. Rash: C/O rash on her elbow crease B/L x 3 weeks, she used OTC cream without improvement. Rash is associated with itching. No fever currently. She was told in the past when she had this that it was bedbugs, she does not feel that is what it is. Now she is having similar symptoms. She stated she cannot use topical steroid which she reacts to badly.  Current Outpatient Prescriptions on File Prior to Visit  Medication Sig Dispense Refill  . acetaminophen (TYLENOL) 325 MG tablet 2 tablets by mouth every 4 hours as need for pain/fever.    Marland Kitchen albuterol (PROVENTIL HFA;VENTOLIN HFA) 108 (90 Base) MCG/ACT inhaler Inhale 1-2 puffs into the lungs every 6 (six) hours as needed for wheezing or shortness of breath. 2 Inhaler 2  . Artificial Tear Ointment (ARTIFICIAL TEARS) ointment Place into both eyes as needed. Keep at bedside 3.5 g 12  . atorvastatin (LIPITOR) 40 MG tablet Take 1 tablet (40 mg total) by mouth at bedtime. 90 tablet 0  . busPIRone (BUSPAR) 15 MG tablet Take 1 tablet (15 mg total) by mouth 3 (three) times daily. 90 tablet 0  . calcium-vitamin D (OSCAL WITH D) 500-200 MG-UNIT tablet TAKE 1 TABLET BY MOUTH TWICE DAILY. 60 tablet 0  . carbamide peroxide (DEBROX) 6.5 % otic solution Place 10 drops into both ears 2 (two) times daily. Dispense 1 bottle 15 mL 0  . clobetasol  cream (TEMOVATE) 3.24 % Apply 1 application topically 2 (two) times daily. 30 g 0  . cloZAPine (CLOZARIL) 100 MG tablet Take 1 tablet (100 mg total) by mouth 2 (two) times daily. Take 1/2 tab every am & 3 tabs every evening 105 tablet 3  . DOK 100 MG capsule TAKE 1 CAPSULE BY MOUTH TWICE DAILY. 60 capsule 0  . famotidine (PEPCID) 40 MG tablet TAKE 1/2 TABLET (=TO A 20MG  DOSE) BY MOUTH TWICE DAILY. (BEFORE MEALS) 30 tablet 0  . fluticasone (FLONASE) 50 MCG/ACT nasal spray INAHALE 2 SPRAYS INTO EACH NOSTRIL ONCE DAILY. 16 g 0  . fluticasone (FLOVENT HFA) 110 MCG/ACT inhaler INHALE 1 PUFF TWICE      DAILY. 12 g 11  . hydrocortisone 2.5 % cream Apply topically 2 (two) times daily. 28.35 g 1  . ipratropium (ATROVENT) 0.06 % nasal spray USE 1 SPRAY IN EACH NOTRIL THREE TIMES DAILY AS DIRECTED. 15 mL 0  . loratadine (CLARITIN) 10 MG tablet TAKE 1 TABLET BY MOUTH ONCE DAILY. 30 tablet 0  . Multiple Vitamins-Minerals (CERTAVITE/ANTIOXIDANTS) TABS TAKE 1 TABLET BY MOUTH ONCE DAILY. REPLACES THERATRUM 30 tablet 0  . neomycin-colistin-hydrocortisone-thonzonium (CORTISPORIN-TC) 3.09-06-08-0.5 MG/ML otic suspension Place 3 drops into both ears 4 (four) times daily. 10 mL 1  . nystatin ointment (MYCOSTATIN) Apply 1 application topically 2 (  two) times daily. 30 g 0  . nystatin-triamcinolone ointment (MYCOLOG) Apply 1 application topically 2 (two) times daily. 30 g 0  . OPTIVE 0.5-0.9 % SOLN INSTILL 1 DROP INTO BOTH EYES 4 TIMES DAILY. MAY SELF-ADMINISTER. PATIENT MAY KEEP AT BEDSIDE. 15 mL 0   No current facility-administered medications on file prior to visit.    Past Medical History:  Diagnosis Date  . Asthma   . 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 Basal cell carcinoma, lip 05/13/2011   Left upper lip. Treated with Mohs micrographic surgery by Dr. Tonita Cong MD. On 08/22/11.    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 Benign per  Derm. Dr. Lajean Saver.    . Schizophrenia ([place])      Review of Systems  Constitutional: Negative.   Respiratory: Negative.   Cardiovascular: Negative.   Gastrointestinal: Negative.   Musculoskeletal:       Fall  Psychiatric/Behavioral: Negative for self-injury and suicidal ideas. The patient is not nervous/anxious.   All other systems reviewed and are negative.      Objective:   Physical Exam  Constitutional: She is oriented to person, place, and time. She appears well-developed. No distress.  Cardiovascular: Normal rate, regular rhythm and normal heart sounds.   No murmur heard. Pulmonary/Chest: Effort normal and breath sounds normal. No respiratory distress. She has no wheezes. She exhibits no tenderness.  Musculoskeletal: Normal range of motion. She exhibits no edema.  Neurological: She is alert and oriented to person, place, and time. She has normal strength. No cranial nerve deficit. Coordination and gait normal.  Skin:     Psychiatric: She has a normal mood and affect. Her behavior is normal.  Nursing note and vitals reviewed.        Assessment:     Fall/Panic Eczema    Plan:     1. Fall due to panics.     Consider PT if this is persistent.     She will discuss with her PCP.    Physical exam benign with normal gait and neurologic status.    May return to work.    Work note given.  2. For Panics and Anxiety: she will schedule F/U with her Psychiatrist soon.  3. I recommended topical steroid, but she stated she can never use those.     May use Eucerin cream.     F/U as needed./

## 2017-05-01 NOTE — Telephone Encounter (Signed)
Supervisor in group home:  Rx with instructions on how to use/ apply cream is needed.  It could be faxed to [phone].  If pt is to discontinue using nystatin, orders are needed for that.  If it is to be continued, another prescription is needed

## 2017-05-05 ENCOUNTER — Encounter: Payer: Self-pay | Admitting: Internal Medicine

## 2017-05-05 NOTE — Telephone Encounter (Signed)
Order placed; in fax pile.

## 2017-05-07 ENCOUNTER — Other Ambulatory Visit: Payer: Self-pay | Admitting: Student

## 2017-05-26 ENCOUNTER — Encounter: Payer: Medicare Other | Admitting: Gastroenterology

## 2017-07-08 ENCOUNTER — Other Ambulatory Visit: Payer: Self-pay | Admitting: Internal Medicine

## 2017-07-08 ENCOUNTER — Other Ambulatory Visit: Payer: Self-pay | Admitting: Student

## 2017-07-09 ENCOUNTER — Encounter: Payer: Self-pay | Admitting: Family Medicine

## 2017-07-21 ENCOUNTER — Encounter: Payer: Medicare Other | Admitting: Gastroenterology

## 2017-07-31 ENCOUNTER — Ambulatory Visit (INDEPENDENT_AMBULATORY_CARE_PROVIDER_SITE_OTHER): Payer: Medicare Other | Admitting: Internal Medicine

## 2017-07-31 ENCOUNTER — Other Ambulatory Visit: Payer: Self-pay

## 2017-07-31 ENCOUNTER — Encounter: Payer: Self-pay | Admitting: Internal Medicine

## 2017-07-31 DIAGNOSIS — J069 Acute upper respiratory infection, unspecified: Secondary | ICD-10-CM

## 2017-07-31 MED ORDER — OSELTAMIVIR PHOSPHATE 75 MG PO CAPS: 75.0000 mg | ORAL_CAPSULE | Freq: Two times a day (BID) | ORAL | 0 refills | Status: DC

## 2017-07-31 NOTE — Patient Instructions (Signed)
It was so nice to see you!  I think you have the flu. I have prescribed some Tamiflu. Please take 1 tablet twice a day for 5 days.  -Dr. Brett Albino

## 2017-08-02 NOTE — Assessment & Plan Note (Signed)
Patient with cough, rhinorrhea, body aches, fevers, sore throat. Think this is likely influenza. No signs of bacterial infection. Given that her symptoms started less than 48 hours ago, will treat with Tamiflu 75mg  bid x 5 days. Return precautions and reasons to go to the ED discussed in detail. Follow-up if no improvement in the next week.

## 2017-08-02 NOTE — Progress Notes (Signed)
   [facility] Phone: [phone]  Subjective:  Jean Williams is a 57 year old female presenting to clinic with body aches, fevers, chills, rhinorrhea, cough, and sore throat for the last 1-2 days. The cough is dry. Her fever has been as high as 100.75F. She also notes decreased appetite but denies any nausea or vomiting. She endorses some mild diarrhea. She denies any chest pain or shortness of breath. She has been using her Albuterol once a day, which is about baseline for her. She hasn't taken any medications for this.  ROS: See HPI for pertinent positives and negatives  Past Medical History- schizophrenia, persistent asthma, HLD  Family history reviewed for today's visit. No changes.  Social history- patient is a current smoker. Smokes 2 packs per day.  Objective: BP 112/68   Pulse (!) 112   Temp (!) 100.5 F (38.1 C) (Oral)   Ht 5\' 6"  (1.676 m)   Wt 178 lb 9.6 oz (81 kg)   LMP 06/10/2011   SpO2 93%   BMI 28.83 kg/m  Gen: NAD, alert, cooperative with exam, mildly ill-appearing but non-toxic HEENT: NCAT, EOMI, MMM, TMs normal bilaterally, nasal turbinates erythematous, oropharynx erythematous Neck: FROM, supple, no cervical lymphadenopathy CV: RRR, no murmur Resp: CTABL, mild expiratory wheezing in all lung fields, normal work of breathing Msk: No edema, warm, normal tone, moves UE/LE spontaneously  Assessment/Plan: Viral URI: Patient with cough, rhinorrhea, body aches, fevers, sore throat. Think this is likely influenza. No signs of bacterial infection. Given that her symptoms started less than 48 hours ago, will treat with Tamiflu 75mg  bid x 5 days. Return precautions and reasons to go to the ED discussed in detail. Follow-up if no improvement in the next week.   Hyman Bible, MD PGY-3

## 2017-08-07 ENCOUNTER — Other Ambulatory Visit: Payer: Self-pay | Admitting: Internal Medicine

## 2017-08-07 DIAGNOSIS — Z0000 Encounter for general adult medical examination without abnormal findings: Secondary | ICD-10-CM

## 2017-08-13 ENCOUNTER — Ambulatory Visit (AMBULATORY_SURGERY_CENTER): Payer: Self-pay | Admitting: *Deleted

## 2017-08-13 ENCOUNTER — Other Ambulatory Visit: Payer: Self-pay

## 2017-08-13 VITALS — Ht 66.0 in | Wt 174.0 lb

## 2017-08-13 DIAGNOSIS — Z1211 Encounter for screening for malignant neoplasm of colon: Secondary | ICD-10-CM

## 2017-08-13 MED ORDER — BISACODYL 5 MG PO TBEC: 5.0000 mg | DELAYED_RELEASE_TABLET | Freq: Once | ORAL | 0 refills | Status: AC

## 2017-08-13 MED ORDER — POLYETHYLENE GLYCOL 3350 17 GM/SCOOP PO POWD: 1.0000 | Freq: Once | ORAL | 0 refills | Status: AC

## 2017-08-13 NOTE — Progress Notes (Signed)
No egg or soy allergy known to patient  No issues with past sedation with any surgeries  or procedures, no intubation problems  No diet pills per patient No home 02 use per patient  No blood thinners per patient  Pt denies issues with constipation  No A fib or A flutter  EMMI video sent to pt's e mail - pt has no e mail  L and [place]br[place] assistant with pt in [place] today

## 2017-08-27 ENCOUNTER — Encounter: Payer: Medicare Other | Admitting: Gastroenterology

## 2017-09-02 ENCOUNTER — Other Ambulatory Visit: Payer: Self-pay

## 2017-09-02 MED ORDER — ATORVASTATIN CALCIUM 40 MG PO TABS: 40.0000 mg | ORAL_TABLET | Freq: Every day | ORAL | 0 refills | Status: DC

## 2017-09-04 ENCOUNTER — Other Ambulatory Visit: Payer: Self-pay | Admitting: Internal Medicine

## 2017-09-07 ENCOUNTER — Ambulatory Visit: Payer: Medicare Other | Admitting: Internal Medicine

## 2017-09-07 ENCOUNTER — Telehealth: Payer: Self-pay | Admitting: Internal Medicine

## 2017-09-07 NOTE — Telephone Encounter (Signed)
Will forward to MD to confirm this medication has been discontinued and if so can we get an order stating this.  Thanks [employer]

## 2017-09-07 NOTE — Telephone Encounter (Signed)
Patient needs proof that her Ipratropium spray has been discontinued (per her helper).  The patient's helper was upset that we could not get this done today but I could not discern if medication was discontinued by [place] or another doctor's office.  The patient has an appt on Wednesday.

## 2017-09-08 ENCOUNTER — Other Ambulatory Visit: Payer: Self-pay | Admitting: Internal Medicine

## 2017-09-09 ENCOUNTER — Encounter: Payer: Self-pay | Admitting: Internal Medicine

## 2017-09-09 ENCOUNTER — Ambulatory Visit (INDEPENDENT_AMBULATORY_CARE_PROVIDER_SITE_OTHER): Payer: Medicare Other | Admitting: Internal Medicine

## 2017-09-09 ENCOUNTER — Other Ambulatory Visit: Payer: Self-pay

## 2017-09-09 DIAGNOSIS — L2082 Flexural eczema: Secondary | ICD-10-CM | POA: Diagnosis not present

## 2017-09-09 DIAGNOSIS — L309 Dermatitis, unspecified: Secondary | ICD-10-CM | POA: Insufficient documentation

## 2017-09-09 NOTE — Patient Instructions (Signed)
It was wonderful to see you today!  I'm so glad your eczema is improving. You should keep using Eucerin at least twice a day. You should also use a soap that doesn't have a scent in it.  -Dr. Brett Albino

## 2017-09-09 NOTE — Progress Notes (Signed)
   [facility] Phone: [phone]  Subjective:  Jean Williams is a 57 year old female presenting to clinic with an eczema flare-up. She has an itchy rash on both of her arms for the last couple of weeks. She states the rash started when she stopped using lotion. She then started using Eucerin twice a day and the rash has greatly improved. States she does not want a steroid cream because they cause her to feel "crazy". No drainage, no fevers, no spreading redness.  ROS: See HPI for pertinent positives and negatives  Past Medical History- schizophrenia, persistent asthma, HLD  Family history reviewed for today's visit. No changes.  Social history- patient is a current smoker, smokes 2 ppd  Objective: BP 110/70   Pulse (!) 111   Temp 97.9 F (36.6 C) (Oral)   Wt 175 lb (79.4 kg)   LMP 06/10/2011   SpO2 99%   BMI 28.25 kg/m  Gen: NAD, alert, cooperative with exam Skin: Dry erythematous patches of skin located on the antecubital fossa bilaterally with overlying excoriations. No drainage.  Assessment/Plan: Eczema: Patient likely having eczema flare after stopping lotion. No superimposed bacterial infection. Has greatly improved with Eucerin bid. Recommended patient continue the Eucerin. Will hold off on steroid cream at this time because it has "made her feel crazy" in the past. Follow-up if worsening.   Hyman Bible, MD PGY-3

## 2017-09-09 NOTE — Telephone Encounter (Signed)
I did not stop this medication. Per our records, she should still be on this medication and I most recently refilled it 08/07/17.

## 2017-09-09 NOTE — Assessment & Plan Note (Signed)
Patient likely having eczema flare after stopping lotion. No superimposed bacterial infection. Has greatly improved with Eucerin bid. Recommended patient continue the Eucerin. Will hold off on steroid cream at this time because it has "made her feel crazy" in the past. Follow-up if worsening.

## 2017-09-09 NOTE — Telephone Encounter (Signed)
Spoke with Diane (caregiver for patient) and she states that she will be with patient today for her appointment and will discuss this medication with provider at that time.  Octavius Shin,CMA

## 2017-09-17 ENCOUNTER — Other Ambulatory Visit: Payer: Self-pay

## 2017-09-17 ENCOUNTER — Encounter: Payer: Self-pay | Admitting: Gastroenterology

## 2017-09-17 ENCOUNTER — Ambulatory Visit (AMBULATORY_SURGERY_CENTER): Payer: Medicare Other | Admitting: Gastroenterology

## 2017-09-17 VITALS — BP 125/87 | HR 90 | Temp 98.6°F | Resp 13 | Ht 66.0 in | Wt 174.0 lb

## 2017-09-17 DIAGNOSIS — Z1212 Encounter for screening for malignant neoplasm of rectum: Secondary | ICD-10-CM

## 2017-09-17 DIAGNOSIS — Z1211 Encounter for screening for malignant neoplasm of colon: Secondary | ICD-10-CM | POA: Diagnosis not present

## 2017-09-17 MED ORDER — SODIUM CHLORIDE 0.9 % IV SOLN: 500.0000 mL | Freq: Once | INTRAVENOUS | Status: DC

## 2017-09-17 NOTE — Progress Notes (Signed)
Pt's states no medical or surgical changes since previsit or office visit. 

## 2017-09-17 NOTE — Progress Notes (Signed)
Report to PACU, RN, vss, BBS= Clear.  

## 2017-09-17 NOTE — Patient Instructions (Signed)
   YOU HAD AN ENDOSCOPIC PROCEDURE TODAY AT [facility]:   Refer to the procedure report that was given to you for any specific questions about what was found during the examination.  If the procedure report does not answer your questions, please call your gastroenterologist to clarify.  If you requested that your care partner not be given the details of your procedure findings, then the procedure report has been included in a sealed envelope for you to review at your convenience later.  YOU SHOULD EXPECT: Some feelings of bloating in the abdomen. Passage of more gas than usual.  Walking can help get rid of the air that was put into your GI tract during the procedure and reduce the bloating. If you had a lower endoscopy (such as a colonoscopy or flexible sigmoidoscopy) you may notice spotting of blood in your stool or on the toilet paper. If you underwent a bowel prep for your procedure, you may not have a normal bowel movement for a few days.  Please Note:  You might notice some irritation and congestion in your nose or some drainage.  This is from the oxygen used during your procedure.  There is no need for concern and it should clear up in a day or so.  SYMPTOMS TO REPORT IMMEDIATELY:   Following lower endoscopy (colonoscopy or flexible sigmoidoscopy):  Excessive amounts of blood in the stool  Significant tenderness or worsening of abdominal pains  Swelling of the abdomen that is new, acute  Fever of 100F or higher    For urgent or emergent issues, a gastroenterologist can be reached at any hour by calling [phone].   DIET:  We do recommend a small meal at first, but then you may proceed to your regular diet.  Drink plenty of fluids but you should avoid alcoholic beverages for 24 hours.  ACTIVITY:  You should plan to take it easy for the rest of today and you should NOT DRIVE or use heavy machinery until tomorrow (because of the sedation medicines used during the  test).    FOLLOW UP: Our staff will call the number listed on your records the next business day following your procedure to check on you and address any questions or concerns that you may have regarding the information given to you following your procedure. If we do not reach you, we will leave a message.  However, if you are feeling well and you are not experiencing any problems, there is no need to return our call.  We will assume that you have returned to your regular daily activities without incident.  If any biopsies were taken you will be contacted by phone or by letter within the next 1-3 weeks.  Please call us at [phone] if you have not heard about the biopsies in 3 weeks.    SIGNATURES/CONFIDENTIALITY: You and/or your care partner have signed paperwork which will be entered into your electronic medical record.  These signatures attest to the fact that that the information above on your After Visit Summary has been reviewed and is understood.  Full responsibility of the confidentiality of this discharge information lies with you and/or your care-partner.   YOUR REPEAT COLONOSCOPY AND A PRE VISIT W/ NURSE HAS BEEN SCHEDULED

## 2017-09-17 NOTE — Op Note (Signed)
Jean Williams Patient Name: Jean Williams Procedure Date: 09/17/2017 8:22 AM MRN: [id number] Endoscopist: Remo Lipps P. Armbruster MD, MD Age: 57 Referring MD:  Date of Birth: July 14, 1960 Gender: Female Account #: [id number] Procedure:                Colonoscopy Indications:              Screening for colorectal malignant neoplasm, This                            is the patient's first colonoscopy Medicines:                Monitored Anesthesia Care Procedure:                Pre-Anesthesia Assessment:                           - Prior to the procedure, a History and Physical                            was performed, and patient medications and                            allergies were reviewed. The patient's tolerance of                            previous anesthesia was also reviewed. The risks                            and benefits of the procedure and the sedation                            options and risks were discussed with the patient.                            All questions were answered, and informed consent                            was obtained. Prior Anticoagulants: The patient has                            taken no previous anticoagulant or antiplatelet                            agents. ASA Grade Assessment: II - A patient with                            mild systemic disease. After reviewing the risks                            and benefits, the patient was deemed in                            satisfactory condition to undergo the procedure.  After obtaining informed consent, the colonoscope                            was passed under direct vision. Throughout the                            procedure, the patient's blood pressure, pulse, and                            oxygen saturations were monitored continuously. The                            Colonoscope was introduced through the anus with                            the intention of  advancing to the cecum. The scope                            was advanced to the sigmoid colon before the                            procedure was aborted. Medications were given. The                            colonoscopy was performed without difficulty. The                            patient tolerated the procedure well. The quality                            of the bowel preparation was poor. The rectum was                            photographed. Scope In: 8:28:23 AM Scope Out: 8:29:11 AM Total Procedure Duration: 0 hours 0 minutes 48 seconds  Findings:                 The perianal and digital rectal examinations were                            normal.                           A large amount of solid stool was found in the                            rectum, in the recto-sigmoid colon and in the                            sigmoid colon, interfering with visualization. This                            could not be cleared adequately and prohibited safe  passage of the colonoscope, in this light the                            procedure was aborted. Complications:            No immediate complications. Estimated blood loss:                            None. Estimated Blood Loss:     Estimated blood loss: none. Impression:               - Preparation of the colon was poor.                           - Stool in the rectum, in the recto-sigmoid colon                            and in the sigmoid colon.                           - Procedure aborted Recommendation:           - Patient has a contact number available for                            emergencies. The signs and symptoms of potential                            delayed complications were discussed with the                            patient. Return to normal activities tomorrow.                            Written discharge instructions were provided to the                            patient.                            - Resume previous diet.                           - Continue present medications.                           - Repeat colonoscopy because the bowel preparation                            was suboptimal. Remo Lipps P. Armbruster MD, MD 09/17/2017 8:32:13 AM This report has been signed electronically.

## 2017-09-18 ENCOUNTER — Telehealth: Payer: Self-pay

## 2017-09-18 ENCOUNTER — Telehealth: Payer: Self-pay | Admitting: *Deleted

## 2017-09-18 NOTE — Telephone Encounter (Signed)
No answer. Fax line--unable to leave message.

## 2017-09-18 NOTE — Telephone Encounter (Signed)
Attempted to reach pt. With follow up call following endoscopic procedure 09/17/17.  Unable to leave message.  Will try to reach pt. Again later today.

## 2017-09-21 ENCOUNTER — Telehealth: Payer: Self-pay | Admitting: Internal Medicine

## 2017-09-21 NOTE — Telephone Encounter (Signed)
Pt needs Rx for Miralax called in to [facility].  She had an colonoscopy that could not be completed because of her impactation. Please advise

## 2017-09-21 NOTE — Telephone Encounter (Signed)
Will forward to MD to advise. Jazmin Hartsell,CMA  

## 2017-09-22 ENCOUNTER — Other Ambulatory Visit: Payer: Self-pay | Admitting: Internal Medicine

## 2017-09-22 MED ORDER — POLYETHYLENE GLYCOL 3350 17 GM/SCOOP PO POWD: 17.0000 g | Freq: Two times a day (BID) | ORAL | 1 refills | Status: DC | PRN

## 2017-09-22 NOTE — Progress Notes (Signed)
I have sent in an Rx for Miralax to her pharmacy.   Phill Myron, D.O. 09/22/2017, 7:28 AM PGY-3, [place]

## 2017-09-30 ENCOUNTER — Other Ambulatory Visit: Payer: Self-pay | Admitting: *Deleted

## 2017-09-30 MED ORDER — ATORVASTATIN CALCIUM 40 MG PO TABS: 40.0000 mg | ORAL_TABLET | Freq: Every day | ORAL | 0 refills | Status: DC

## 2017-10-05 ENCOUNTER — Other Ambulatory Visit: Payer: Self-pay | Admitting: Internal Medicine

## 2017-10-05 DIAGNOSIS — Z0000 Encounter for general adult medical examination without abnormal findings: Secondary | ICD-10-CM

## 2017-10-13 ENCOUNTER — Ambulatory Visit: Payer: Medicare Other | Admitting: Internal Medicine

## 2017-11-04 ENCOUNTER — Ambulatory Visit (AMBULATORY_SURGERY_CENTER): Payer: Self-pay

## 2017-11-04 VITALS — Ht 66.0 in | Wt 179.2 lb

## 2017-11-04 DIAGNOSIS — Z1211 Encounter for screening for malignant neoplasm of colon: Secondary | ICD-10-CM

## 2017-11-04 MED ORDER — NA SULFATE-K SULFATE-MG SULF 17.5-3.13-1.6 GM/177ML PO SOLN: 1.0000 | Freq: Once | ORAL | 0 refills | Status: AC

## 2017-11-04 MED ORDER — BISACODYL 5 MG PO TBEC: 5.0000 mg | DELAYED_RELEASE_TABLET | Freq: Once | ORAL | 0 refills | Status: AC

## 2017-11-04 MED ORDER — POLYETHYLENE GLYCOL 3350 17 GM/SCOOP PO POWD: 1.0000 | Freq: Every day | ORAL | 3 refills | Status: DC

## 2017-11-04 NOTE — Progress Notes (Signed)
Jean Williams- the caregiver was with pt during her PV. Patient states she lives at [facility],  in [place].    Pt was distracted easily and hard to get information from her and unsure of correct medications she was taking. Reviewed instructions and prep instructions several times with pt and caregiver and answered question regarding a 2 day prep.   Per pt no allergies to soy or egg products.Pt not taking any weight loss meds or using  O2 at home.  Pt refused emmi video.

## 2017-11-06 ENCOUNTER — Ambulatory Visit (INDEPENDENT_AMBULATORY_CARE_PROVIDER_SITE_OTHER): Payer: Medicare Other | Admitting: Internal Medicine

## 2017-11-06 ENCOUNTER — Other Ambulatory Visit: Payer: Self-pay

## 2017-11-06 ENCOUNTER — Encounter: Payer: Self-pay | Admitting: Internal Medicine

## 2017-11-06 VITALS — Ht 66.0 in | Wt 177.0 lb

## 2017-11-06 DIAGNOSIS — Z0289 Encounter for other administrative examinations: Secondary | ICD-10-CM | POA: Insufficient documentation

## 2017-11-06 DIAGNOSIS — Z022 Encounter for examination for admission to residential institution: Secondary | ICD-10-CM | POA: Diagnosis present

## 2017-11-06 MED ORDER — CARBOXYMETHYLCELLUL-GLYCERIN 0.5-0.9 % OP SOLN: 1.0000 [drp] | Freq: Four times a day (QID) | OPHTHALMIC | 0 refills | Status: DC

## 2017-11-06 NOTE — Assessment & Plan Note (Signed)
FL-2 filled out and returned to patient

## 2017-11-06 NOTE — Progress Notes (Signed)
   [facility] Phone: [phone]  Subjective:  Jean Williams is a 57 year old female presenting to clinic for FL-2 paperwork. She currently resides in a group home. She will be staying there. No changes to any medications.  ROS: See HPI for pertinent positives and negatives  Past Medical History- schizophrenia, persistent asthma, HLD  Family history reviewed for today's visit. No changes.  Social history- patient is a current smoker  Objective: Ht 5\' 6"  (1.676 m)   Wt 177 lb (80.3 kg)   LMP 06/10/2011   BMI 28.57 kg/m  Gen: NAD, alert, cooperative with exam HEENT: NCAT, EOMI, MMM Resp: Normal work of breathing Neuro: Alert and oriented, no gross deficits Psych: Appropriate behavior  Assessment/Plan: FL-2 Paperwork: FL-2 filled out and returned to patient   Hyman Bible, MD PGY-3

## 2017-11-10 ENCOUNTER — Emergency Department (HOSPITAL_COMMUNITY): Payer: Medicare Other

## 2017-11-10 ENCOUNTER — Emergency Department (HOSPITAL_COMMUNITY)
Admission: EM | Admit: 2017-11-10 | Discharge: 2017-11-10 | Disposition: A | Discharge status: Home / Self Care | Payer: Medicare Other | Attending: Emergency Medicine | Admitting: Emergency Medicine

## 2017-11-10 ENCOUNTER — Encounter (HOSPITAL_COMMUNITY): Payer: Self-pay

## 2017-11-10 ENCOUNTER — Other Ambulatory Visit: Payer: Self-pay

## 2017-11-10 DIAGNOSIS — F17210 Nicotine dependence, cigarettes, uncomplicated: Secondary | ICD-10-CM | POA: Insufficient documentation

## 2017-11-10 DIAGNOSIS — R1012 Left upper quadrant pain: Secondary | ICD-10-CM | POA: Insufficient documentation

## 2017-11-10 DIAGNOSIS — R109 Unspecified abdominal pain: Secondary | ICD-10-CM

## 2017-11-10 DIAGNOSIS — J45909 Unspecified asthma, uncomplicated: Secondary | ICD-10-CM | POA: Diagnosis not present

## 2017-11-10 DIAGNOSIS — Z85828 Personal history of other malignant neoplasm of skin: Secondary | ICD-10-CM | POA: Diagnosis not present

## 2017-11-10 DIAGNOSIS — Z79899 Other long term (current) drug therapy: Secondary | ICD-10-CM | POA: Diagnosis not present

## 2017-11-10 LAB — CBC
HCT: 44.4 % (ref 36.0–46.0)
HEMOGLOBIN: 14.6 g/dL (ref 12.0–15.0)
MCH: 31.1 pg (ref 26.0–34.0)
MCHC: 32.9 g/dL (ref 30.0–36.0)
MCV: 94.5 fL (ref 78.0–100.0)
Platelets: 275 10*3/uL (ref 150–400)
RBC: 4.7 MIL/uL (ref 3.87–5.11)
RDW: 13.5 % (ref 11.5–15.5)
WBC: 10.8 10*3/uL — ABNORMAL HIGH (ref 4.0–10.5)

## 2017-11-10 LAB — BASIC METABOLIC PANEL
ANION GAP: 9 (ref 5–15)
BUN: 9 mg/dL (ref 6–20)
CHLORIDE: 106 mmol/L (ref 101–111)
CO2: 25 mmol/L (ref 22–32)
CREATININE: 0.87 mg/dL (ref 0.44–1.00)
Calcium: 9.3 mg/dL (ref 8.9–10.3)
GFR calc non Af Amer: >60 mL/min (ref >60)
Glucose, Bld: 106 mg/dL — ABNORMAL HIGH (ref 65–99)
Potassium: 4 mmol/L (ref 3.5–5.1)
Sodium: 140 mmol/L (ref 135–145)

## 2017-11-10 LAB — URINALYSIS, ROUTINE W REFLEX MICROSCOPIC
BILIRUBIN URINE: NEGATIVE
Bacteria, UA: NONE SEEN
Glucose, UA: NEGATIVE mg/dL
Hgb urine dipstick: NEGATIVE
Ketones, ur: NEGATIVE mg/dL
Nitrite: NEGATIVE
Protein, ur: NEGATIVE mg/dL
SPECIFIC GRAVITY, URINE: 1.015 (ref 1.005–1.030)
pH: 6 (ref 5.0–8.0)

## 2017-11-10 IMAGING — CT CT RENAL STONE PROTOCOL
2 of 4 series · 16 of 46 positions shown, 18 images · non-contrast
Comparison: None.

CLINICAL DATA: LEFT flank pain for 1 day.

EXAM:
CT ABDOMEN AND PELVIS WITHOUT CONTRAST
TECHNIQUE: Multidetector CT imaging of the abdomen and pelvis was performed
following the standard protocol without IV contrast.

[Series 4: abd/ pelvis 5.0 i30f 2 · axial · 0.71mm/px · z∈[+820,+1280]mm · 13 of 102 slices shown, 15 images]
[im 5/102  soft-tissue]
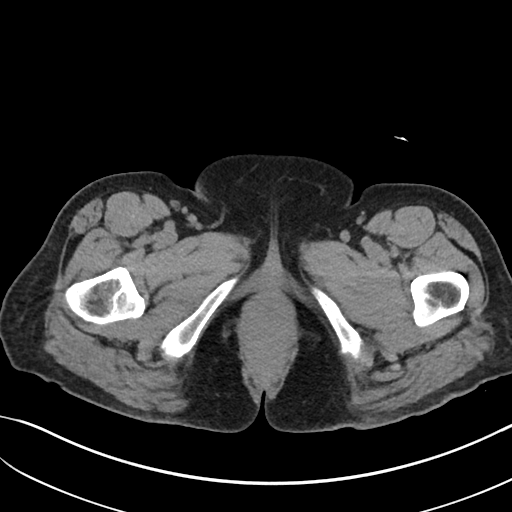
[im 5/102  bone]
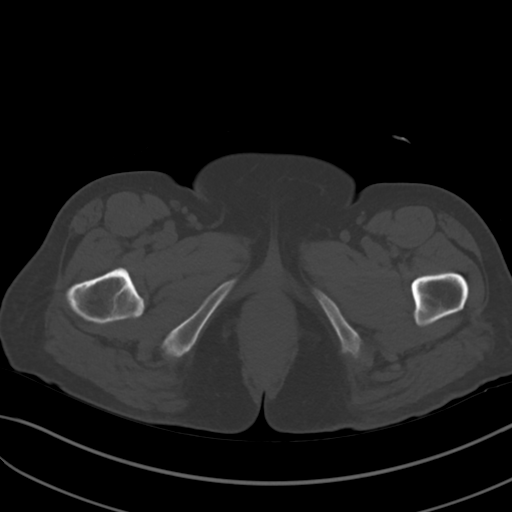
[im 13/102  soft-tissue]
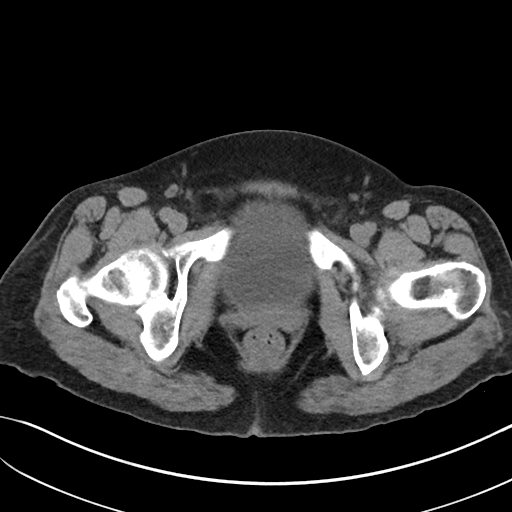
[im 21/102  soft-tissue]
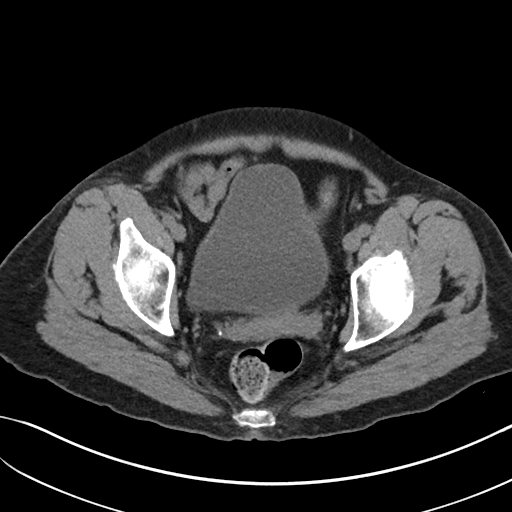
[im 29/102  soft-tissue]
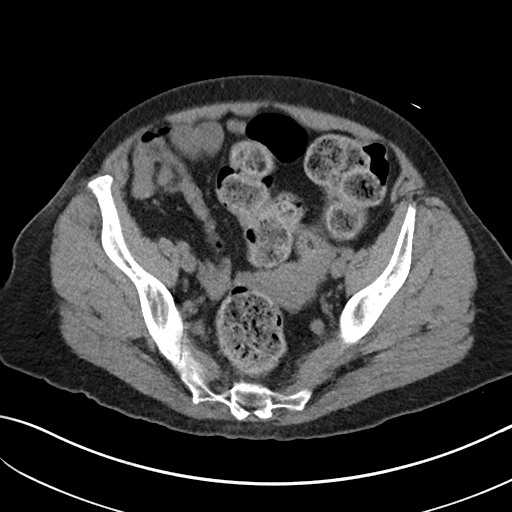
[im 37/102  soft-tissue]
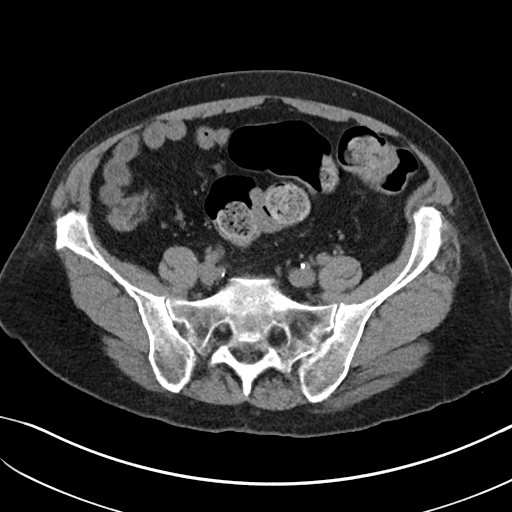
[im 45/102  soft-tissue]
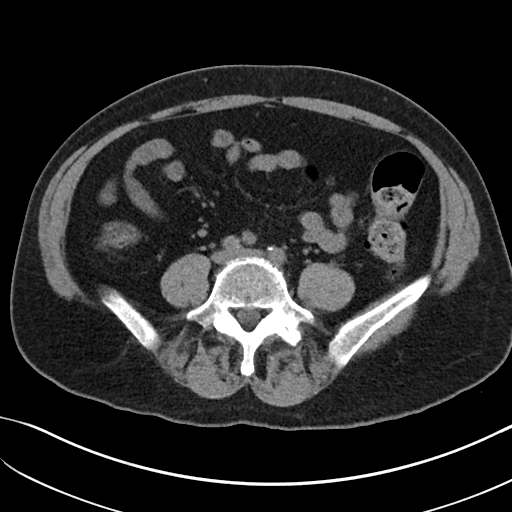
[im 53/102  soft-tissue]
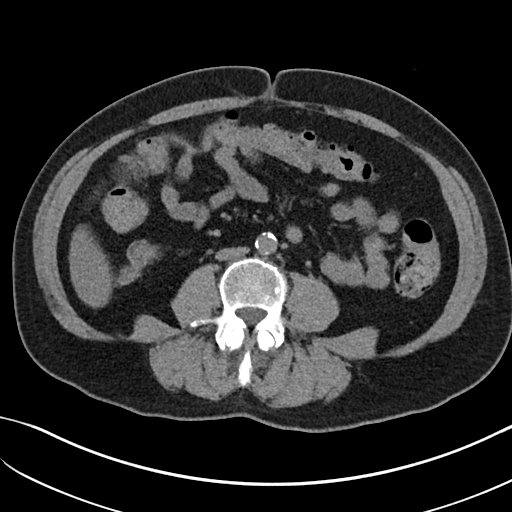
[im 57/102  soft-tissue]
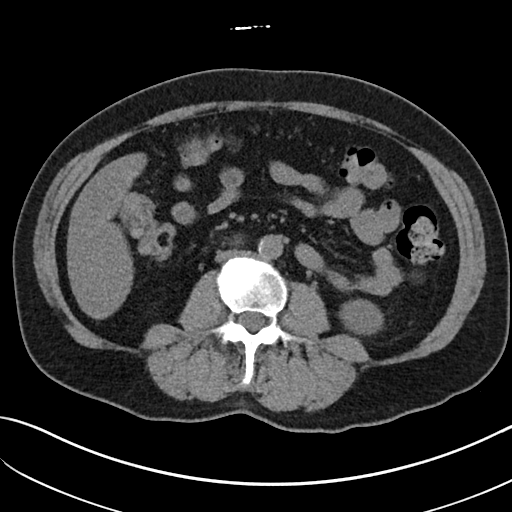
[im 65/102  soft-tissue]
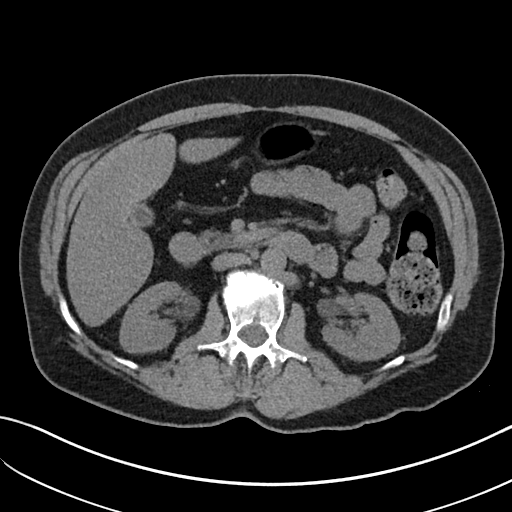
[im 65/102  bone]
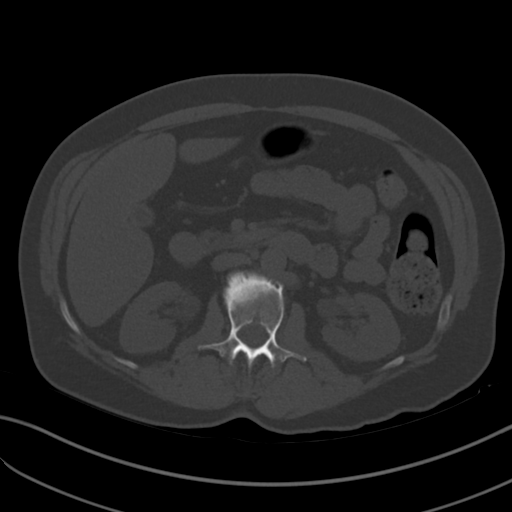
[im 73/102  soft-tissue]
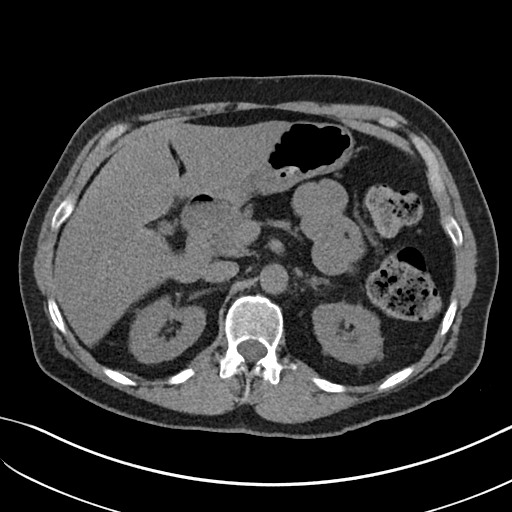
[im 81/102  soft-tissue]
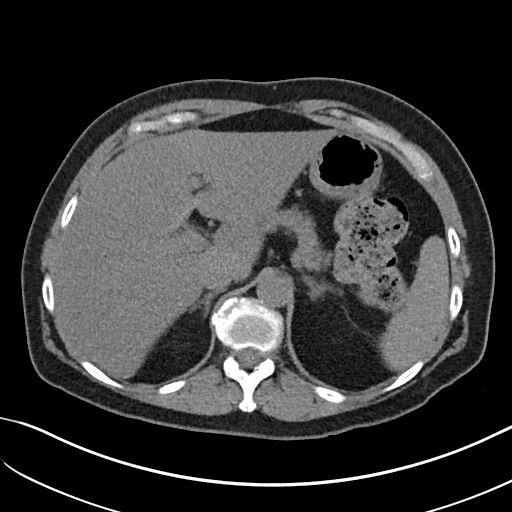
[im 89/102  soft-tissue]
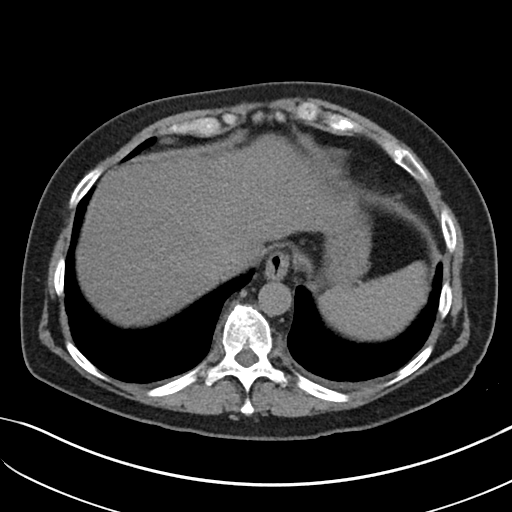
[im 97/102  soft-tissue]
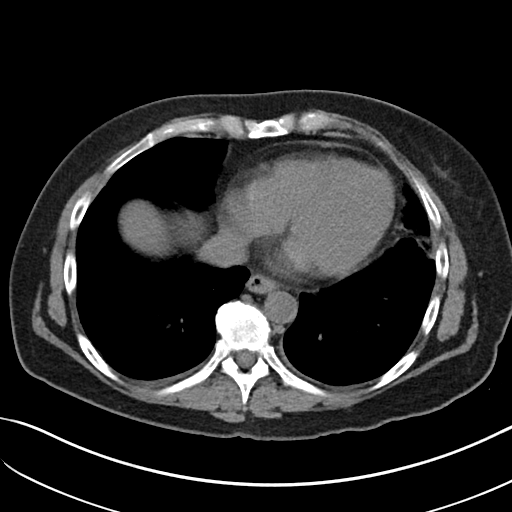

[Series 7: cor st · coronal · 0.81mm/px · 3 of 109 slices shown]
[im 37/109  soft-tissue]
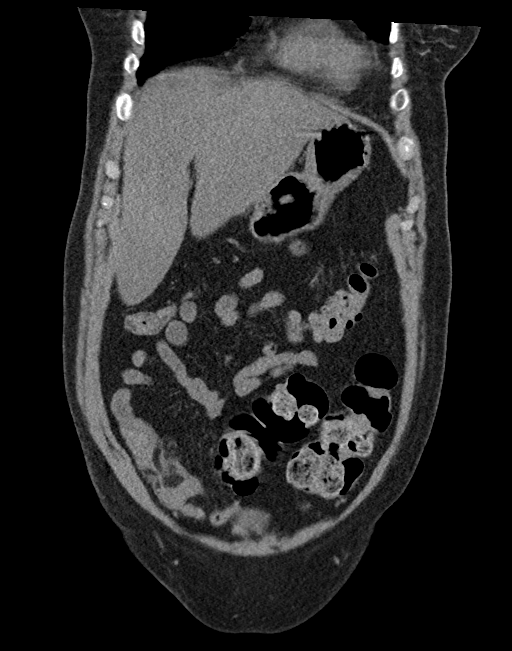
[im 49/109  soft-tissue]
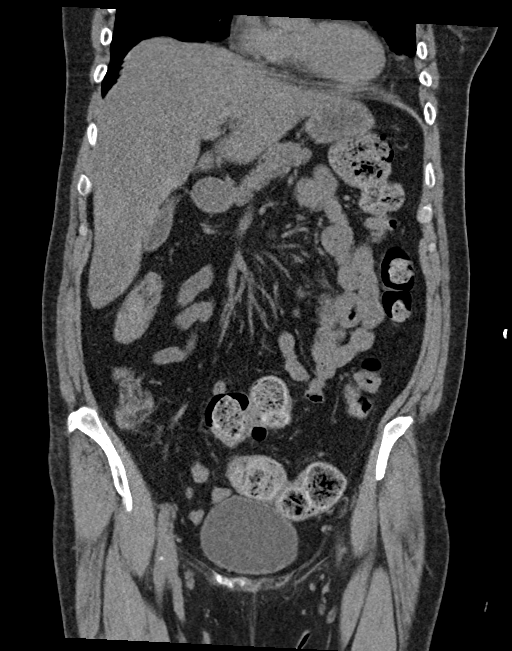
[im 61/109  soft-tissue]
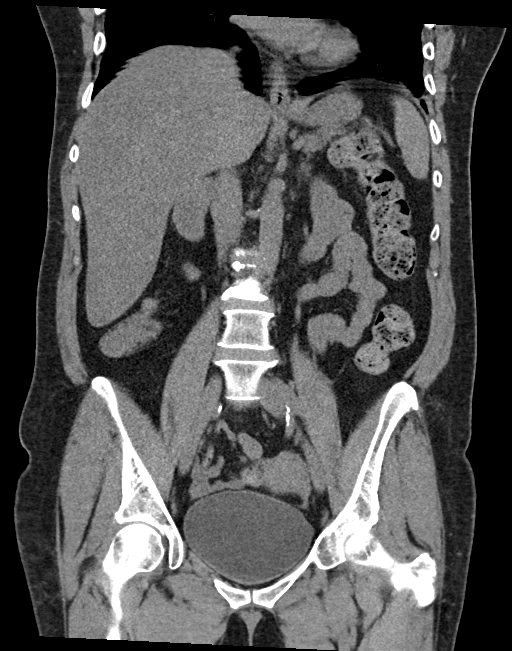

[16 of 46 positions shown; findings below may reference images not displayed]

FINDINGS: Lower chest: No acute abnormality.

Hepatobiliary: No focal liver abnormality is seen. No gallstones,
gallbladder wall thickening, or biliary dilatation. Suspected
steatosis.

Pancreas: Unremarkable. No pancreatic ductal dilatation or
surrounding inflammatory changes.

Spleen: No splenic injury or perisplenic hematoma.

Adrenals/Urinary Tract: Adrenal glands are unremarkable. Kidneys are
normal, without renal calculi, focal lesion, or hydronephrosis.
Bladder is unremarkable.

Stomach/Bowel: Stomach is within normal limits. Appendix appears
normal. No evidence of bowel wall thickening, distention, or
inflammatory changes.

Vascular/Lymphatic: Aortic atherosclerosis. No enlarged abdominal or
pelvic lymph nodes.

Reproductive: Uterus and bilateral adnexa are unremarkable.

Other: No abdominal wall hernia or abnormality. No abdominopelvic
ascites.

Musculoskeletal: No acute or significant osseous findings.
IMPRESSION: Unremarkable CT urogram. No renal calculi are observed. There is no
obstructive uropathy.

Aortic Atherosclerosis ([id number]-OSD.D).

## 2017-11-10 MED ORDER — ACETAMINOPHEN 325 MG PO TABS
650.0000 mg | ORAL_TABLET | Freq: Once | ORAL | Status: AC
  Administered 2017-11-10: 650 mg via ORAL
  Filled 2017-11-10: qty 2

## 2017-11-10 MED ORDER — ACETAMINOPHEN 325 MG PO TABS: 650.0000 mg | ORAL_TABLET | Freq: Four times a day (QID) | ORAL | 0 refills | Status: DC | PRN

## 2017-11-10 NOTE — ED Triage Notes (Signed)
When speaking with the patient she states she woke up this morning with pain to her left flank area. Pt states she thinks the pain could be from being stressed out. Pt denies nausea or vomiting. No distress noted.

## 2017-11-10 NOTE — Discharge Instructions (Addendum)
Please read attached information. If you experience any new or worsening signs or symptoms please return to the emergency room for evaluation. Please follow-up with your primary care provider or specialist as discussed. Please use medication prescribed only as directed and discontinue taking if you have any concerning signs or symptoms.   °

## 2017-11-10 NOTE — ED Notes (Addendum)
Pt stated that her pain has increased from that time she has came in. I gave pt a hot pack and warm blanket to help with that pian. Pt stated that it feels a little better.

## 2017-11-10 NOTE — ED Triage Notes (Signed)
Pt arrived via [facility] with c/o left side back pain since yesterday. Pt HR 120, hx of same with no known cause per EMS. 118/72 BP 16 RR, 96% RA, 111 CBG. Pt ambulatory upon arrival.

## 2017-11-10 NOTE — ED Provider Notes (Signed)
[facility] EMERGENCY DEPARTMENT Provider Note   CSN: [id number] Arrival date & time: 11/10/17  9735     History   Chief Complaint Chief Complaint  Patient presents with  . Flank Pain    HPI Jean Williams is a 57 y.o. female.  HPI   57 year old female presents today with complaints of left-sided flank pain.  Patient reports yesterday while working she developed left flank pain.  She notes this was improved this morning but was instructed to come to the emergency room at the living facility she is at.  Patient denies any nausea vomiting, denies any lower abdominal pain, dysuria, or changes in bowel movement.  She denies any fever.  No medications prior to evaluation.  No history of the same.      Past Medical History:  Diagnosis Date  . Allergy   . Anxiety   . Asthma    per pt, doesn"t have asthma  . 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 Basal cell carcinoma, lip 05/13/2011   Left upper lip. Treated with Mohs micrographic surgery by Dr. Tonita Cong MD. On 08/22/11.    Marland Kitchen GERD (gastroesophageal reflux disease)   . Hyperlipidemia   . Panic attacks   . 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 Benign per Derm. Dr. Lajean Saver.    . Schizophrenia ([place])   . Smoker   . Tachycardia     Patient Active Problem List   Diagnosis Date Noted  . Encounter for completion of form with patient 11/06/2017  . Eczema 09/09/2017  . Health care maintenance 05/29/2014  . Viral URI 10/14/2013  . Lives in group home 12/27/2012  . Mixed hyperlipidemia 04/19/2009  . MAJOR DPRSV DISORDER RECURRENT EPISODE UNSPEC 01/16/2009  . UNSPECIFIED TACHYCARDIA 01/09/2009  . ASTHMA, PERSISTENT 02/03/2008  . Schizophrenia, unspecified type ([place]) 09/03/2006  . PANIC ATTACKS 09/03/2006  . TOBACCO DEPENDENCE 09/03/2006  . DYSPEPSIA 09/03/2006  . Rosacea 09/03/2006    Past Surgical History:  Procedure  Laterality Date  . COLONOSCOPY    . gyn surgery     for pre cancerous cells  . skin cancer removal     from lip     OB History   None      Home Medications    Prior to Admission medications   Medication Sig Start Date End Date Taking? Authorizing Provider  loratadine (CLARITIN) 10 MG tablet TAKE 1 TABLET BY MOUTH ONCE DAILY. Patient not taking: Reported on 11/04/2017 10/05/17   Mayo, Pete Pelt, MD  acetaminophen (TYLENOL) 325 MG tablet Take 2 tablets (650 mg total) by mouth every 6 (six) hours as needed. 11/10/17   Kaci Dillie, Dellis Filbert, PA-C  albuterol (PROVENTIL HFA;VENTOLIN HFA) 108 (90 Base) MCG/ACT inhaler Inhale 1-2 puffs into the lungs every 6 (six) hours as needed for wheezing or shortness of breath. 10/01/15   Vivi Barrack, MD  Artificial Tear Ointment (ARTIFICIAL TEARS) ointment Place into both eyes as needed. Keep at bedside 11/30/14   Hilton Sinclair, MD  atorvastatin (LIPITOR) 40 MG tablet TAKE 1 TABLET BY MOUTH AT  BEDTIME. 10/05/17   Mayo, Pete Pelt, MD  busPIRone (BUSPAR) 15 MG tablet Take 1 tablet (15 mg total) by mouth 3 (three) times daily. Patient not taking: Reported on 09/17/2017 07/18/16   Mayo, Pete Pelt, MD  calcium-vitamin D (OSCAL WITH D) 500-200 MG-UNIT TABS tablet TAKE 1 TABLET BY MOUTH TWICE DAILY.  10/05/17   Mayo, Pete Pelt, MD  carbamide peroxide [facility]) 6.5 % otic solution Place 10 drops into both ears 2 (two) times daily. Dispense 1 bottle Patient not taking: Reported on 09/17/2017 11/30/14   Hilton Sinclair, MD  carboxymethylcellul-glycerin (OPTIVE) 0.5-0.9 % ophthalmic solution Place 1 drop into both eyes 4 (four) times daily. Cannot self-administer 11/06/17   Mayo, Pete Pelt, MD  clobetasol cream (TEMOVATE) 9.47 % Apply 1 application topically 2 (two) times daily. Patient not taking: Reported on 08/13/2017 04/28/16   MayoPete Pelt, MD  cloZAPine (CLOZARIL) 100 MG tablet Take 1 tablet (100 mg total) by mouth 2 (two) times daily. Take 1/2 tab every am & 3  tabs every evening 01/10/11   Funches, Adriana Mccallum, MD  Colloidal Oatmeal (EUCERIN ECZEMA RELIEF) 1 % CREA Apply to affected skin twice daily Patient not taking: Reported on 11/04/2017 05/01/17   Kinnie Feil, MD  famotidine (PEPCID) 40 MG tablet TAKE 1/2 TABLET (=TO A 20MG  DOSE) BY MOUTH TWICE DAILY. (BEFORE MEALS) 01/05/17   Mercy Riding, MD  famotidine (PEPCID) 40 MG tablet TAKE 1/2 TABLET (=TO A 20MG  DOSE) BY MOUTH TWICE DAILY. (BEFORE MEALS) 08/07/17   Mayo, Pete Pelt, MD  famotidine (PEPCID) 40 MG tablet TAKE 1/2 TABLET (=TO A 20MG  DOSE) BY MOUTH TWICE DAILY. (BEFORE MEALS) 10/05/17   Mayo, Pete Pelt, MD  fluticasone (FLONASE) 50 MCG/ACT nasal spray INAHALE 2 SPRAYS INTO EACH NOSTRIL ONCE DAILY. 01/05/17   Mercy Riding, MD  fluticasone (FLONASE) 50 MCG/ACT nasal spray INAHALE 2 SPRAYS INTO EACH NOSTRIL ONCE DAILY. 09/04/17   Mayo, Pete Pelt, MD  fluticasone (FLOVENT HFA) 110 MCG/ACT inhaler INHALE 1 PUFF BY MOUTH TWICE DAILY. RINSE MOUTH AFTER USE. 10/05/17   Mayo, Pete Pelt, MD  hydrocortisone 2.5 % cream Apply topically 2 (two) times daily. Patient not taking: Reported on 09/17/2017 04/14/16   Veatrice Bourbon, MD  loratadine (CLARITIN) 10 MG tablet TAKE 1 TABLET BY MOUTH ONCE DAILY. Patient not taking: Reported on 11/04/2017 01/05/17   Mercy Riding, MD  loratadine (CLARITIN) 10 MG tablet TAKE 1 TABLET BY MOUTH ONCE DAILY. Patient not taking: Reported on 11/04/2017 08/07/17   MayoPete Pelt, MD  metroNIDAZOLE (METROCREAM) 0.75 % cream Apply 1 application topically 2 (two) times daily.    [provider]  Multiple Vitamins-Minerals (CERTAVITE/ANTIOXIDANTS) TABS TAKE 1 TABLET BY MOUTH ONCE DAILY. REPLACES [facility] 10/05/17   Mayo, Pete Pelt, MD  neomycin-colistin-hydrocortisone-thonzonium (CORTISPORIN-TC) 3.09-06-08-0.5 MG/ML otic suspension Place 3 drops into both ears 4 (four) times daily. Patient not taking: Reported on 09/17/2017 12/30/13   Hilton Sinclair, MD  nystatin ointment (MYCOSTATIN) Apply 1  application topically 2 (two) times daily. Patient not taking: Reported on 09/17/2017 04/09/17   Steve Rattler, DO  nystatin-triamcinolone ointment (MYCOLOG) Apply 1 application topically 2 (two) times daily. 04/06/17   Steve Rattler, DO  PARoxetine (PAXIL) 30 MG tablet Take 30 mg by mouth daily.    [provider]  PARoxetine (PAXIL) 30 MG tablet TAKE 1 TABLET BY MOUTH IN THE MORNING. 08/07/17   Mayo, Pete Pelt, MD  PARoxetine (PAXIL) 30 MG tablet TAKE 1 TABLET BY MOUTH IN THE MORNING. 10/05/17   Mayo, Pete Pelt, MD  polyethylene glycol powder (GLYCOLAX/MIRALAX) powder Take 17 g by mouth 2 (two) times daily as needed. 09/22/17   Nicolette Bang, DO  polyethylene glycol powder (GLYCOLAX/MIRALAX) powder Take 255 g by mouth daily. 11/04/17   Armbruster, Carlota Raspberry, MD  Family History Family History  Problem Relation Age of Onset  . Cancer Sister   . Breast cancer Sister   . Cancer Maternal Grandmother   . Breast cancer Maternal Grandmother   . Colon cancer Neg Hx   . Colon polyps Neg Hx   . Rectal cancer Neg Hx   . Stomach cancer Neg Hx     Social History Social History   Tobacco Use  . Smoking status: Current Every Day Smoker    Packs/day: 1.00    Types: Cigarettes  . Smokeless tobacco: Never Used  Substance Use Topics  . Alcohol use: No    Alcohol/week: 0.0 oz  . Drug use: No     Allergies   Penicillins   Review of Systems Review of Systems  All other systems reviewed and are negative.    Physical Exam Updated Vital Signs BP (!) 142/74 (BP Location: Right Arm)   Pulse 87   Temp 98.5 F (36.9 C) (Oral)   Resp 20   LMP 06/10/2011   SpO2 100%   Physical Exam  Constitutional: She is oriented to person, place, and time. She appears well-developed and well-nourished.  HENT:  Head: Normocephalic and atraumatic.  Eyes: Pupils are equal, round, and reactive to light. Conjunctivae are normal. Right eye exhibits no discharge. Left eye exhibits no  discharge. No scleral icterus.  Neck: Normal range of motion. No JVD present. No tracheal deviation present.  Pulmonary/Chest: Effort normal. No stridor.  Abdominal: Soft. Bowel sounds are normal. She exhibits no distension and no mass. There is no tenderness. There is no rebound and no guarding. No hernia.  Musculoskeletal: Normal range of motion. She exhibits no edema.  No CT or L-spine tenderness, no muscular tenderness to the back  Neurological: She is alert and oriented to person, place, and time. Coordination normal.  Psychiatric: She has a normal mood and affect. Her behavior is normal. Judgment and thought content normal.  Nursing note and vitals reviewed.    ED Treatments / Results  Labs (all labs ordered are listed, but only abnormal results are displayed) Labs Reviewed  URINALYSIS, ROUTINE W REFLEX MICROSCOPIC - Abnormal; Notable for the following components:      Result Value   Leukocytes, UA TRACE (*)    All other components within normal limits  BASIC METABOLIC PANEL - Abnormal; Notable for the following components:   Glucose, Bld 106 (*)    All other components within normal limits  CBC - Abnormal; Notable for the following components:   WBC 10.8 (*)    All other components within normal limits    EKG None  Radiology Ct Renal Stone Study  Result Date: 11/10/2017 CLINICAL DATA:  LEFT flank pain for 1 day. EXAM: CT ABDOMEN AND PELVIS WITHOUT CONTRAST TECHNIQUE: Multidetector CT imaging of the abdomen and pelvis was performed following the standard protocol without IV contrast. COMPARISON:  None. FINDINGS: Lower chest: No acute abnormality. Hepatobiliary: No focal liver abnormality is seen. No gallstones, gallbladder wall thickening, or biliary dilatation. Suspected steatosis. Pancreas: Unremarkable. No pancreatic ductal dilatation or surrounding inflammatory changes. Spleen: No splenic injury or perisplenic hematoma. Adrenals/Urinary Tract: Adrenal glands are  unremarkable. Kidneys are normal, without renal calculi, focal lesion, or hydronephrosis. Bladder is unremarkable. Stomach/Bowel: Stomach is within normal limits. Appendix appears normal. No evidence of bowel wall thickening, distention, or inflammatory changes. Vascular/Lymphatic: Aortic atherosclerosis. No enlarged abdominal or pelvic lymph nodes. Reproductive: Uterus and bilateral adnexa are unremarkable. Other: No abdominal wall hernia or abnormality.  No abdominopelvic ascites. Musculoskeletal: No acute or significant osseous findings. IMPRESSION: Unremarkable CT urogram. No renal calculi are observed. There is no obstructive uropathy. Aortic Atherosclerosis (ICD10-I70.0). Electronically Signed   By: Staci Righter M.D.   On: 11/10/2017 15:00    Procedures Procedures (including critical care time)  Medications Ordered in ED Medications  acetaminophen (TYLENOL) tablet 650 mg (650 mg Oral Given 11/10/17 1307)     Initial Impression / Assessment and Plan / ED Course  I have reviewed the triage vital signs and the nursing notes.  Pertinent labs & imaging results that were available during my care of the patient were reviewed by me and considered in my medical decision making (see chart for details).      Final Clinical Impressions(s) / ED Diagnoses   Final diagnoses:  Flank pain   Labs: BMP, CBC, urinalysis  Imaging: CT renal stone study  Consults:  Therapeutics:  Discharge Meds:   Assessment/Plan: 57 year old female presents today with flank pain.  Patient likely having muscular pain, low suspicion for acute intra-abdominal pathology.  She has very reassuring work-up with no significant abnormalities on her labs other than slight elevation in white count.  I have low suspicion for infection.  CT renal with no acute abnormalities.  Patient discharged with symptomatic care and strict return precautions.  She verbalized understanding and agreement to today's plan had no further  questions or concerns the time discharge.     ED Discharge Orders        Ordered    acetaminophen (TYLENOL) 325 MG tablet  Every 6 hours PRN     11/10/17 1505       Okey Regal, PA-C 11/10/17 2142    Tegeler, Gwenyth Allegra, MD 11/10/17 2355

## 2017-11-10 NOTE — ED Notes (Signed)
Patient transported to CT 

## 2017-11-13 ENCOUNTER — Encounter: Payer: Self-pay | Admitting: Internal Medicine

## 2017-11-13 ENCOUNTER — Ambulatory Visit (INDEPENDENT_AMBULATORY_CARE_PROVIDER_SITE_OTHER): Payer: Medicare Other | Admitting: Internal Medicine

## 2017-11-13 ENCOUNTER — Other Ambulatory Visit: Payer: Self-pay

## 2017-11-13 DIAGNOSIS — R109 Unspecified abdominal pain: Secondary | ICD-10-CM | POA: Diagnosis present

## 2017-11-13 NOTE — Assessment & Plan Note (Signed)
Likely MSK etiology, as patient identifying preceding incident, as well as normal labs and imaging in ED three days ago. In addition to neg imaging, patient's description of nature of pain less consistent with renal calculi, and location of pain quite high for this etiology. Significant improvement of pain with Tylenol alone also reassuring and suggestive of MSK etiology as well. Unable to reproduce pain on palpation or with movement today, however patient admits that pain is not currently present. No further work-up indicated at this time. Encouraged patient to schedule appt if pain does not continue to improve.

## 2017-11-13 NOTE — Progress Notes (Signed)
   Subjective:   Patient: Jean Williams       Birthdate: 29-Oct-1960       MRN: [id number]      HPI  ARNEISHA KINCANNON is a 57 y.o. female presenting for L flank pain.   L flank pain Began 4 days ago. Seen in ED for this issue 3d ago and was found to have normal labwork and normal abd CT without kidney or gallstones. Since then, patient says pain is "much much better." Is still taking Tylenol 2-3 times per day, one tablet at a time. Pain still located only in L flank. Says pain is not present all the day, and is currently not present at all. Sometimes pain feels like a shooting sensation. Denies dysuria, hematuria. Denies fevers N/V. Normal appetite. No diarrhea. 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   Smoking status reviewed. Patient is current every day smoker.   Review of Systems See HPI.     Objective:  Physical Exam  Constitutional: She is oriented to person, place, and time. She appears well-developed and well-nourished. No distress.  HENT:  Head: Normocephalic and atraumatic.  Pulmonary/Chest: Effort normal. No respiratory distress.  Abdominal: Soft. She exhibits no distension. There is no tenderness.  Neg CVA tenderness  Musculoskeletal:  No TTP of L flank. Full back and upper extremity ROM without reproduction of pain. Able to walk, sit, and stand without difficulty.   Neurological: She is alert and oriented to person, place, and time.  Skin: Skin is warm and dry.  Psychiatric: She has a normal mood and affect. Her behavior is normal.   Assessment & Plan:  Acute left flank pain Likely MSK etiology, as patient identifying preceding incident, as well as normal labs and imaging in ED three days ago. In addition to neg imaging, patient's description of nature of pain less consistent with renal calculi, and location of pain quite high for this etiology. Significant improvement of  pain with Tylenol alone also reassuring and suggestive of MSK etiology as well. Unable to reproduce pain on palpation or with movement today, however patient admits that pain is not currently present. No further work-up indicated at this time. Encouraged patient to schedule appt if pain does not continue to improve.    Adin Hector, MD, MPH PGY-3 [facility] Pager [phone]

## 2017-11-13 NOTE — Patient Instructions (Addendum)
It was nice meeting you today Jean Williams!  You can continue taking Tylenol as needed. You can also use a heating pad for pain. If your symptoms do not improve in the next few weeks, please let us know.   If you have any questions or concerns, please feel free to call the clinic.   Be well,  Dr. Avon Gully

## 2017-11-16 ENCOUNTER — Encounter: Payer: Self-pay | Admitting: Gastroenterology

## 2017-11-16 ENCOUNTER — Ambulatory Visit (AMBULATORY_SURGERY_CENTER): Payer: Medicare Other | Admitting: Gastroenterology

## 2017-11-16 VITALS — BP 127/93 | HR 92 | Temp 97.8°F | Resp 20 | Ht 66.0 in | Wt 179.0 lb

## 2017-11-16 DIAGNOSIS — D127 Benign neoplasm of rectosigmoid junction: Secondary | ICD-10-CM | POA: Diagnosis not present

## 2017-11-16 DIAGNOSIS — Z1211 Encounter for screening for malignant neoplasm of colon: Secondary | ICD-10-CM

## 2017-11-16 DIAGNOSIS — D123 Benign neoplasm of transverse colon: Secondary | ICD-10-CM | POA: Diagnosis not present

## 2017-11-16 DIAGNOSIS — D122 Benign neoplasm of ascending colon: Secondary | ICD-10-CM

## 2017-11-16 MED ORDER — SODIUM CHLORIDE 0.9 % IV SOLN: 500.0000 mL | Freq: Once | INTRAVENOUS | Status: DC

## 2017-11-16 NOTE — Progress Notes (Signed)
To recovery, report to RN, VSS. 

## 2017-11-16 NOTE — Patient Instructions (Signed)
YOU HAD AN ENDOSCOPIC PROCEDURE TODAY AT [facility]:   Refer to the procedure report that was given to you for any specific questions about what was found during the examination.  If the procedure report does not answer your questions, please call your gastroenterologist to clarify.  If you requested that your care partner not be given the details of your procedure findings, then the procedure report has been included in a sealed envelope for you to review at your convenience later.  YOU SHOULD EXPECT: Some feelings of bloating in the abdomen. Passage of more gas than usual.  Walking can help get rid of the air that was put into your GI tract during the procedure and reduce the bloating. If you had a lower endoscopy (such as a colonoscopy or flexible sigmoidoscopy) you may notice spotting of blood in your stool or on the toilet paper. If you underwent a bowel prep for your procedure, you may not have a normal bowel movement for a few days.  Please Note:  You might notice some irritation and congestion in your nose or some drainage.  This is from the oxygen used during your procedure.  There is no need for concern and it should clear up in a day or so.  SYMPTOMS TO REPORT IMMEDIATELY:   Following lower endoscopy (colonoscopy or flexible sigmoidoscopy):  Excessive amounts of blood in the stool  Significant tenderness or worsening of abdominal pains  Swelling of the abdomen that is new, acute  Fever of 100F or higher   For urgent or emergent issues, a gastroenterologist can be reached at any hour by calling [phone].   DIET:  We do recommend a small meal at first, but then you may proceed to your regular diet.  Drink plenty of fluids but you should avoid alcoholic beverages for 24 hours.  ACTIVITY:  You should plan to take it easy for the rest of today and you should NOT DRIVE or use heavy machinery until tomorrow (because of the sedation medicines used during the test).     FOLLOW UP: Our staff will call the number listed on your records the next business day following your procedure to check on you and address any questions or concerns that you may have regarding the information given to you following your procedure. If we do not reach you, we will leave a message.  However, if you are feeling well and you are not experiencing any problems, there is no need to return our call.  We will assume that you have returned to your regular daily activities without incident.  If any biopsies were taken you will be contacted by phone or by letter within the next 1-3 weeks.  Please call us at [phone] if you have not heard about the biopsies in 3 weeks.    SIGNATURES/CONFIDENTIALITY: You and/or your care partner have signed paperwork which will be entered into your electronic medical record.  These signatures attest to the fact that that the information above on your After Visit Summary has been reviewed and is understood.  Full responsibility of the confidentiality of this discharge information lies with you and/or your care-partner.  Recall pathology report.

## 2017-11-16 NOTE — Op Note (Signed)
[facility] Patient Name: Jean Williams Procedure Date: 11/16/2017 8:36 AM MRN: [id number] Endoscopist: Remo Lipps P. Taira Knabe MD, MD Age: 57 Referring MD:  Date of Birth: 09-28-1960 Gender: Female Account #: [id number] Procedure:                Colonoscopy Indications:              Screening for colorectal malignant neoplasm - last                            exam limited by poor prep, 2 day prep for this exam Medicines:                Monitored Anesthesia Care Procedure:                Pre-Anesthesia Assessment:                           - Prior to the procedure, a History and Physical                            was performed, and patient medications and                            allergies were reviewed. The patient's tolerance of                            previous anesthesia was also reviewed. The risks                            and benefits of the procedure and the sedation                            options and risks were discussed with the patient.                            All questions were answered, and informed consent                            was obtained. Prior Anticoagulants: The patient has                            taken no previous anticoagulant or antiplatelet                            agents. ASA Grade Assessment: III - A patient with                            severe systemic disease. After reviewing the risks                            and benefits, the patient was deemed in                            satisfactory condition to undergo the procedure.  After obtaining informed consent, the colonoscope                            was passed under direct vision. Throughout the                            procedure, the patient's blood pressure, pulse, and                            oxygen saturations were monitored continuously. The                            Colonoscope was introduced through the anus and    advanced to the the cecum, identified by                            appendiceal orifice and ileocecal valve. The                            colonoscopy was technically difficult and complex                            due to significant looping. The patient tolerated                            the procedure well. The quality of the bowel                            preparation was fair. The ileocecal valve,                            appendiceal orifice, and rectum were photographed. Scope In: 8:44:47 AM Scope Out: 9:13:46 AM Scope Withdrawal Time: 0 hours 9 minutes 58 seconds  Total Procedure Duration: 0 hours 28 minutes 59 seconds  Findings:                 The perianal and digital rectal examinations were                            normal.                           A 3 mm polyp was found in the ascending colon. The                            polyp was sessile. The polyp was removed with a                            cold snare. Resection and retrieval were complete.                           Three sessile polyps were found in the transverse  colon. The polyps were 3 to 5 mm in size. These                            polyps were removed with a cold snare. Resection                            and retrieval were complete.                           A 4 to 5 mm polyp was found in the recto-sigmoid                            colon. The polyp was sessile. The polyp was removed                            with a cold snare. Resection and retrieval were                            complete.                           Internal hemorrhoids were found during retroflexion.                           A large amount of liquid stool was found in the                            entire colon, making visualization difficult.                            Several minutes were spent lavaging the colon using                            copious amounts of sterile water, resulting in                             clearance with fair visualization. Small or flat                            polyps may not have been appreciated in certain                            portions of the colon.                           The exam was otherwise without abnormality. Complications:            No immediate complications. Estimated blood loss:                            Minimal. Estimated Blood Loss:     Estimated blood loss was minimal. Impression:               - Preparation of the colon was fair.                           -  One 3 mm polyp in the ascending colon, removed                            with a cold snare. Resected and retrieved.                           - Three 3 to 5 mm polyps in the transverse colon,                            removed with a cold snare. Resected and retrieved.                           - One 4 to 5 mm polyp at the recto-sigmoid colon,                            removed with a cold snare. Resected and retrieved.                           - Internal hemorrhoids.                           - The examination was otherwise normal. Recommendation:           - Patient has a contact number available for                            emergencies. The signs and symptoms of potential                            delayed complications were discussed with the                            patient. Return to normal activities tomorrow.                            Written discharge instructions were provided to the                            patient.                           - Resume previous diet.                           - Continue present medications.                           - Await pathology results. Remo Lipps P. Daily Doe MD, MD 11/16/2017 9:20:51 AM This report has been signed electronically.

## 2017-11-16 NOTE — Progress Notes (Signed)
Pt's states no medical or surgical changes since previsit or office visit. 

## 2017-11-16 NOTE — Progress Notes (Signed)
Called to room to assist during endoscopic procedure.  Patient ID and intended procedure confirmed with present staff. Received instructions for my participation in the procedure from the performing physician.  

## 2017-11-17 ENCOUNTER — Telehealth: Payer: Self-pay | Admitting: *Deleted

## 2017-11-17 NOTE — Telephone Encounter (Signed)
  Follow up Call-  Call back number 11/16/2017 09/17/2017  Post procedure Call Back phone  # [phone] staff will answer phone. ok to talk to them. [phone]  Permission to leave phone message Yes Yes  Some recent data might be hidden     Patient questions:  Do you have a fever, pain , or abdominal swelling? No. Pain Score  0 *  Have you tolerated food without any problems? Yes.    Have you been able to return to your normal activities? Yes.    Do you have any questions about your discharge instructions: Diet   No. Medications  No. Follow up visit  No.  Do you have questions or concerns about your Care? No.  Actions: * If pain score is 4 or above: No action needed, pain <4.

## 2017-11-20 ENCOUNTER — Inpatient Hospital Stay: Payer: Medicare Other | Admitting: Internal Medicine

## 2017-11-24 ENCOUNTER — Encounter: Payer: Self-pay | Admitting: Gastroenterology

## 2017-12-03 ENCOUNTER — Telehealth: Payer: Self-pay | Admitting: Gastroenterology

## 2017-12-03 NOTE — Telephone Encounter (Signed)
Spoke to April, had her look on colonoscopy report where Dr. Havery Moros states that patient may resume previous diet, with his signature. She will use that as the order, otherwise currently dietary is not allowing patient to eat nuts, corn raw vegetables. She will call back if they will not accept that form of an order.

## 2017-12-03 NOTE — Telephone Encounter (Signed)
April calling from [facility] best call back [phone].

## 2017-12-09 ENCOUNTER — Other Ambulatory Visit: Payer: Self-pay | Admitting: *Deleted

## 2017-12-11 MED ORDER — ATORVASTATIN CALCIUM 40 MG PO TABS: 40.0000 mg | ORAL_TABLET | Freq: Every day | ORAL | 0 refills | Status: DC

## 2018-01-06 ENCOUNTER — Other Ambulatory Visit: Payer: Self-pay

## 2018-01-06 MED ORDER — ATORVASTATIN CALCIUM 40 MG PO TABS: 40.0000 mg | ORAL_TABLET | Freq: Every day | ORAL | 3 refills | Status: DC

## 2018-01-24 ENCOUNTER — Other Ambulatory Visit: Payer: Self-pay

## 2018-01-24 ENCOUNTER — Emergency Department (HOSPITAL_COMMUNITY): Payer: Medicare Other

## 2018-01-24 ENCOUNTER — Encounter (HOSPITAL_COMMUNITY): Payer: Self-pay

## 2018-01-24 ENCOUNTER — Emergency Department (HOSPITAL_COMMUNITY)
Admission: EM | Admit: 2018-01-24 | Discharge: 2018-01-25 | Disposition: A | Discharge status: Home / Self Care | Payer: Medicare Other | Attending: Emergency Medicine | Admitting: Emergency Medicine

## 2018-01-24 DIAGNOSIS — F17210 Nicotine dependence, cigarettes, uncomplicated: Secondary | ICD-10-CM | POA: Insufficient documentation

## 2018-01-24 DIAGNOSIS — Z85828 Personal history of other malignant neoplasm of skin: Secondary | ICD-10-CM | POA: Insufficient documentation

## 2018-01-24 DIAGNOSIS — F419 Anxiety disorder, unspecified: Secondary | ICD-10-CM

## 2018-01-24 DIAGNOSIS — R0602 Shortness of breath: Secondary | ICD-10-CM | POA: Diagnosis present

## 2018-01-24 DIAGNOSIS — J45909 Unspecified asthma, uncomplicated: Secondary | ICD-10-CM | POA: Diagnosis not present

## 2018-01-24 DIAGNOSIS — Z79899 Other long term (current) drug therapy: Secondary | ICD-10-CM | POA: Insufficient documentation

## 2018-01-24 LAB — COMPREHENSIVE METABOLIC PANEL
ALT: 28 U/L (ref 0–44)
ANION GAP: 11 (ref 5–15)
AST: 21 U/L (ref 15–41)
Albumin: 3.8 g/dL (ref 3.5–5.0)
Alkaline Phosphatase: 84 U/L (ref 38–126)
BUN: 10 mg/dL (ref 6–20)
CALCIUM: 9 mg/dL (ref 8.9–10.3)
CO2: 25 mmol/L (ref 22–32)
Chloride: 106 mmol/L (ref 98–111)
Creatinine, Ser: 0.86 mg/dL (ref 0.44–1.00)
Glucose, Bld: 146 mg/dL — ABNORMAL HIGH (ref 70–99)
POTASSIUM: 3.5 mmol/L (ref 3.5–5.1)
Sodium: 142 mmol/L (ref 135–145)
TOTAL PROTEIN: 6.8 g/dL (ref 6.5–8.1)
Total Bilirubin: 0.9 mg/dL (ref 0.3–1.2)

## 2018-01-24 LAB — CBC
HCT: 43.7 % (ref 36.0–46.0)
Hemoglobin: 14.7 g/dL (ref 12.0–15.0)
MCH: 31.7 pg (ref 26.0–34.0)
MCHC: 33.6 g/dL (ref 30.0–36.0)
MCV: 94.4 fL (ref 78.0–100.0)
PLATELETS: 286 10*3/uL (ref 150–400)
RBC: 4.63 MIL/uL (ref 3.87–5.11)
RDW: 13.5 % (ref 11.5–15.5)
WBC: 7.7 10*3/uL (ref 4.0–10.5)

## 2018-01-24 LAB — SALICYLATE LEVEL: Salicylate Lvl: <7 mg/dL (ref 2.8–30.0)

## 2018-01-24 LAB — ACETAMINOPHEN LEVEL

## 2018-01-24 LAB — RAPID URINE DRUG SCREEN, HOSP PERFORMED
Amphetamines: NOT DETECTED
Barbiturates: NOT DETECTED
Benzodiazepines: NOT DETECTED
COCAINE: NOT DETECTED
Opiates: NOT DETECTED
Tetrahydrocannabinol: NOT DETECTED

## 2018-01-24 LAB — I-STAT BETA HCG BLOOD, ED (MC, WL, AP ONLY)

## 2018-01-24 LAB — ETHANOL

## 2018-01-24 MED ORDER — ALBUTEROL SULFATE HFA 108 (90 BASE) MCG/ACT IN AERS: 1.0000 | INHALATION_SPRAY | Freq: Four times a day (QID) | RESPIRATORY_TRACT | Status: DC | PRN

## 2018-01-24 MED ORDER — PAROXETINE HCL 20 MG PO TABS: 30.0000 mg | ORAL_TABLET | Freq: Every morning | ORAL | Status: DC

## 2018-01-24 MED ORDER — FLUTICASONE PROPIONATE 50 MCG/ACT NA SUSP
1.0000 | Freq: Every day | NASAL | Status: DC
  Filled 2018-01-24: qty 16

## 2018-01-24 MED ORDER — CALCIUM CARBONATE-VITAMIN D 500-200 MG-UNIT PO TABS: 1.0000 | ORAL_TABLET | Freq: Two times a day (BID) | ORAL | Status: DC

## 2018-01-24 MED ORDER — ACETAMINOPHEN 325 MG PO TABS: 650.0000 mg | ORAL_TABLET | Freq: Four times a day (QID) | ORAL | Status: DC | PRN

## 2018-01-24 MED ORDER — CLOZAPINE 100 MG PO TABS
200.0000 mg | ORAL_TABLET | Freq: Every day | ORAL | Status: DC
  Filled 2018-01-24: qty 2

## 2018-01-24 MED ORDER — POLYVINYL ALCOHOL 1.4 % OP SOLN
1.0000 [drp] | OPHTHALMIC | Status: DC | PRN
Start: 2018-01-25 — End: 2018-01-25

## 2018-01-24 MED ORDER — IOPAMIDOL (ISOVUE-370) INJECTION 76%
100.0000 mL | Freq: Once | INTRAVENOUS | Status: AC | PRN
  Administered 2018-01-24: 100 mL via INTRAVENOUS

## 2018-01-24 MED ORDER — ATORVASTATIN CALCIUM 40 MG PO TABS: 40.0000 mg | ORAL_TABLET | Freq: Every day | ORAL | Status: DC

## 2018-01-24 NOTE — ED Triage Notes (Signed)
Pt BIB [facility] from group home. Reports increased panic attacks and anxiety. Over the past week she reports leg weakness that compounds her anxiety. She is requesting a psych eval and is interested in going to [facility]. No medical complaints at this time. A&Ox4.

## 2018-01-24 NOTE — ED Notes (Signed)
Bed: [facility] Expected date:  Expected time:  Means of arrival:  Comments: 

## 2018-01-24 NOTE — ED Provider Notes (Signed)
[facility] DEPT Provider Note   CSN: [id number] Arrival date & time: 01/24/18  2139     History   Chief Complaint Chief Complaint  Patient presents with  . Medical Clearance    HPI LUCCIA REINHEIMER is a 57 y.o. female presenting for anxiety.  Patient states for the past 2 weeks, she has been feeling more anxious.  She states that when she stands up and walks, she feels very anxious, her heart beats fast, and she feels short of breath.  She describes the feeling as her blood is draining from her head.  Caregiver of group home interviewed, and states she was only told about patient's symptoms today.  3 weeks ago, patient's Clozaril dose was increased.  No other medication changes recently.  Caregiver denies recent fevers, chills, nausea, vomiting, cough, complaints of abdominal pain.  Patient has been upset recently, as she has not been at work since school is out for summer and she works in [employer] at the school.  History of anxiety, COPD, schizophrenia, long-term tobacco use, MDD.  HPI  Past Medical History:  Diagnosis Date  . Allergy   . Anxiety   . Asthma    per pt, doesn"t have asthma  . 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 Basal cell carcinoma, lip 05/13/2011   Left upper lip. Treated with Mohs micrographic surgery by Dr. Tonita Cong MD. On 08/22/11.    Marland Kitchen GERD (gastroesophageal reflux disease)   . Hyperlipidemia   . Panic attacks   . 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 Benign per Derm. Dr. Lajean Saver.    . Schizophrenia ([place])   . Smoker   . Tachycardia     Patient Active Problem List   Diagnosis Date Noted  . Acute left flank pain 11/13/2017  . Encounter for completion of form with patient 11/06/2017  . Eczema 09/09/2017  . Health care maintenance 05/29/2014  . Lives in group home 12/27/2012  . Mixed hyperlipidemia 04/19/2009  . MAJOR DPRSV  DISORDER RECURRENT EPISODE UNSPEC 01/16/2009  . UNSPECIFIED TACHYCARDIA 01/09/2009  . ASTHMA, PERSISTENT 02/03/2008  . Schizophrenia, unspecified type ([place]) 09/03/2006  . PANIC ATTACKS 09/03/2006  . TOBACCO DEPENDENCE 09/03/2006  . DYSPEPSIA 09/03/2006  . Rosacea 09/03/2006    Past Surgical History:  Procedure Laterality Date  . COLONOSCOPY    . gyn surgery     for pre cancerous cells  . skin cancer removal     from lip     OB History   None      Home Medications    Prior to Admission medications   Medication Sig Start Date End Date Taking? Authorizing Provider  loratadine (CLARITIN) 10 MG tablet TAKE 1 TABLET BY MOUTH ONCE DAILY. Patient not taking: Reported on 11/04/2017 10/05/17   Mayo, Pete Pelt, MD  acetaminophen (TYLENOL) 325 MG tablet Take 2 tablets (650 mg total) by mouth every 6 (six) hours as needed. 11/10/17   Hedges, Dellis Filbert, PA-C  albuterol (PROVENTIL HFA;VENTOLIN HFA) 108 (90 Base) MCG/ACT inhaler Inhale 1-2 puffs into the lungs every 6 (six) hours as needed for wheezing or shortness of breath. 10/01/15   Vivi Barrack, MD  Artificial Tear Ointment (ARTIFICIAL TEARS) ointment Place into both eyes as needed. Keep at bedside 11/30/14   Hilton Sinclair, MD  atorvastatin (LIPITOR) 40 MG tablet Take 1 tablet (40 mg total) by mouth at bedtime. 01/06/18  [name], MD  busPIRone (BUSPAR) 15 MG tablet Take 1 tablet (15 mg total) by mouth 3 (three) times daily. Patient not taking: Reported on 09/17/2017 07/18/16   Mayo, Pete Pelt, MD  calcium-vitamin D (OSCAL WITH D) 500-200 MG-UNIT TABS tablet TAKE 1 TABLET BY MOUTH TWICE DAILY. 10/05/17   Mayo, Pete Pelt, MD  carbamide peroxide [facility]) 6.5 % otic solution Place 10 drops into both ears 2 (two) times daily. Dispense 1 bottle Patient not taking: Reported on 09/17/2017 11/30/14   Hilton Sinclair, MD  carboxymethylcellul-glycerin (OPTIVE) 0.5-0.9 % ophthalmic solution Place 1 drop into both eyes 4 (four) times daily.  Cannot self-administer 11/06/17   Mayo, Pete Pelt, MD  clobetasol cream (TEMOVATE) 4.09 % Apply 1 application topically 2 (two) times daily. Patient not taking: Reported on 08/13/2017 04/28/16   MayoPete Pelt, MD  cloZAPine (CLOZARIL) 100 MG tablet Take 1 tablet (100 mg total) by mouth 2 (two) times daily. Take 1/2 tab every am & 3 tabs every evening 01/10/11   Funches, Adriana Mccallum, MD  Colloidal Oatmeal (EUCERIN ECZEMA RELIEF) 1 % CREA Apply to affected skin twice daily 05/01/17   Andrena Mews T, MD  famotidine (PEPCID) 40 MG tablet TAKE 1/2 TABLET (=TO A 20MG  DOSE) BY MOUTH TWICE DAILY. (BEFORE MEALS) 01/05/17   Mercy Riding, MD  famotidine (PEPCID) 40 MG tablet TAKE 1/2 TABLET (=TO A 20MG  DOSE) BY MOUTH TWICE DAILY. (BEFORE MEALS) 08/07/17   Mayo, Pete Pelt, MD  famotidine (PEPCID) 40 MG tablet TAKE 1/2 TABLET (=TO A 20MG  DOSE) BY MOUTH TWICE DAILY. (BEFORE MEALS) Patient not taking: Reported on 11/16/2017 10/05/17   Mayo, Pete Pelt, MD  fluticasone (FLONASE) 50 MCG/ACT nasal spray INAHALE 2 SPRAYS INTO EACH NOSTRIL ONCE DAILY. 01/05/17   Mercy Riding, MD  fluticasone (FLONASE) 50 MCG/ACT nasal spray INAHALE 2 SPRAYS INTO EACH NOSTRIL ONCE DAILY. 09/04/17   Mayo, Pete Pelt, MD  fluticasone (FLOVENT HFA) 110 MCG/ACT inhaler INHALE 1 PUFF BY MOUTH TWICE DAILY. RINSE MOUTH AFTER USE. 10/05/17   Mayo, Pete Pelt, MD  hydrocortisone 2.5 % cream Apply topically 2 (two) times daily. Patient not taking: Reported on 09/17/2017 04/14/16   Veatrice Bourbon, MD  loratadine (CLARITIN) 10 MG tablet TAKE 1 TABLET BY MOUTH ONCE DAILY. 01/05/17   Mercy Riding, MD  loratadine (CLARITIN) 10 MG tablet TAKE 1 TABLET BY MOUTH ONCE DAILY. Patient not taking: Reported on 11/04/2017 08/07/17   MayoPete Pelt, MD  metroNIDAZOLE (METROCREAM) 0.75 % cream Apply 1 application topically 2 (two) times daily.    [provider]  Multiple Vitamins-Minerals (CERTAVITE/ANTIOXIDANTS) TABS TAKE 1 TABLET BY MOUTH ONCE DAILY. REPLACES [facility]  10/05/17   Mayo, Pete Pelt, MD  neomycin-colistin-hydrocortisone-thonzonium (CORTISPORIN-TC) 3.09-06-08-0.5 MG/ML otic suspension Place 3 drops into both ears 4 (four) times daily. Patient not taking: Reported on 09/17/2017 12/30/13   Hilton Sinclair, MD  nystatin ointment (MYCOSTATIN) Apply 1 application topically 2 (two) times daily. Patient not taking: Reported on 09/17/2017 04/09/17   Steve Rattler, DO  nystatin-triamcinolone ointment (MYCOLOG) Apply 1 application topically 2 (two) times daily. 04/06/17   Steve Rattler, DO  PARoxetine (PAXIL) 30 MG tablet Take 30 mg by mouth daily.    [provider]  PARoxetine (PAXIL) 30 MG tablet TAKE 1 TABLET BY MOUTH IN THE MORNING. 08/07/17   Mayo, Pete Pelt, MD  PARoxetine (PAXIL) 30 MG tablet TAKE 1 TABLET BY MOUTH IN THE MORNING. 10/05/17   Mayo, Pete Pelt,  MD    Family History Family History  Problem Relation Age of Onset  . Cancer Sister   . Breast cancer Sister   . Cancer Maternal Grandmother   . Breast cancer Maternal Grandmother   . Stroke Other   . Heart failure Other   . Colon cancer Neg Hx   . Colon polyps Neg Hx   . Rectal cancer Neg Hx   . Stomach cancer Neg Hx     Social History Social History   Tobacco Use  . Smoking status: Current Every Day Smoker    Packs/day: 1.00    Types: Cigarettes  . Smokeless tobacco: Never Used  Substance Use Topics  . Alcohol use: No    Alcohol/week: 0.0 oz  . Drug use: No     Allergies   Penicillins   Review of Systems Review of Systems  Respiratory: Positive for shortness of breath.   Psychiatric/Behavioral: The patient is nervous/anxious.   All other systems reviewed and are negative.    Physical Exam Updated Vital Signs BP (!) 139/94 (BP Location: Left Arm)   Pulse (!) 102   Temp 97.9 F (36.6 C) (Oral)   Resp 18   Ht 5\' 6"  (1.676 m)   Wt 81.2 kg (179 lb)   LMP 06/10/2011   SpO2 96%   BMI 28.89 kg/m   Physical Exam  Constitutional: She appears  well-developed and well-nourished. No distress.  HENT:  Head: Normocephalic and atraumatic.  Eyes: Pupils are equal, round, and reactive to light. Conjunctivae and EOM are normal.  Neck: Normal range of motion.  Cardiovascular: Regular rhythm and intact distal pulses.  Mildly tachycardic around 105  Pulmonary/Chest: Effort normal and breath sounds normal. No respiratory distress. She has no wheezes.  Pleuritic lung sounds in all fields, inspiratory and expiratory. In no distress  Abdominal: Soft. She exhibits no distension. There is no tenderness.  Musculoskeletal: Normal range of motion.  Neurological: She is alert. Gait normal. GCS eye subscore is 4. GCS verbal subscore is 5. GCS motor subscore is 6.  A&O x3.  Occasionally has difficulty responding to questions appropriately.  Caregiver states this is baseline  Skin: Skin is warm. Capillary refill takes less than 2 seconds. No rash noted.  Psychiatric: Her mood appears anxious. Her speech is tangential. She is hyperactive. She expresses no homicidal and no suicidal ideation. She expresses no suicidal plans and no homicidal plans.  Calm and cooperative.  Occasionally becomes visibly anxious.  Hyperactive movements.  Tangential thoughts.  Nursing note and vitals reviewed.    ED Treatments / Results  Labs (all labs ordered are listed, but only abnormal results are displayed) Labs Reviewed  COMPREHENSIVE METABOLIC PANEL - Abnormal; Notable for the following components:      Result Value   Glucose, Bld 146 (*)    All other components within normal limits  ACETAMINOPHEN LEVEL - Abnormal; Notable for the following components:   Acetaminophen (Tylenol), Serum <10 (*)    All other components within normal limits  ETHANOL  SALICYLATE LEVEL  CBC  RAPID URINE DRUG SCREEN, HOSP PERFORMED  I-STAT BETA HCG BLOOD, ED ([facility], [facility], [facility] ONLY)    EKG None  Radiology Ct Angio Chest Pe W/cm &/or Wo Cm  Result Date: 01/24/2018 CLINICAL DATA:   Shortness of breath EXAM: CT ANGIOGRAPHY CHEST WITH CONTRAST TECHNIQUE: Multidetector CT imaging of the chest was performed using the standard protocol during bolus administration of intravenous contrast. Multiplanar CT image reconstructions and MIPs were obtained to  evaluate the vascular anatomy. CONTRAST:  148mL ISOVUE-370 IOPAMIDOL (ISOVUE-370) INJECTION 76% COMPARISON:  CT abdomen pelvis 11/10/2017 FINDINGS: Cardiovascular: Satisfactory opacification of the pulmonary arteries to the segmental level. No evidence of pulmonary embolism. Normal heart size. No pericardial effusion. Nonaneurysmal aorta. No dissection seen Mediastinum/Nodes: No enlarged mediastinal, hilar, or axillary lymph nodes. Thyroid gland, trachea, and esophagus demonstrate no significant findings. Lungs/Pleura: Minimal emphysema. No acute consolidation or effusion. 3 mm pulmonary nodule in the left lower lobe series 6, image number 61. Upper Abdomen: No acute abnormality. Musculoskeletal: Degenerative changes. No acute or suspicious abnormality Review of the MIP images confirms the above findings. IMPRESSION: 1. Negative for acute pulmonary embolus. 2. Minimal emphysema 3. 3 mm left lower lobe pulmonary nodule. No follow-up needed if patient is low-risk. Non-contrast chest CT can be considered in 12 months if patient is high-risk. This recommendation follows the consensus statement: Guidelines for Management of Incidental Pulmonary Nodules Detected on CT Images: From the Fleischner Society 2017; Radiology 2017; 284:228-243. Emphysema (ICD10-J43.9). Electronically Signed   By: Donavan Foil M.D.   On: 01/24/2018 23:42    Procedures Procedures (including critical care time)  Medications Ordered in ED Medications  acetaminophen (TYLENOL) tablet 650 mg (has no administration in time range)  albuterol (PROVENTIL HFA;VENTOLIN HFA) 108 (90 Base) MCG/ACT inhaler 1-2 puff (has no administration in time range)  atorvastatin (LIPITOR) tablet 40  mg (has no administration in time range)  calcium-vitamin D (OSCAL WITH D) 500-200 MG-UNIT per tablet 1 tablet (has no administration in time range)  carboxymethylcellul-glycerin (REFRESH OPTIVE) 0.5-0.9 % ophthalmic solution 1 drop (has no administration in time range)  cloZAPine (CLOZARIL) tablet 100 mg (has no administration in time range)  fluticasone (FLONASE) 50 MCG/ACT nasal spray 1 spray (has no administration in time range)  PARoxetine (PAXIL) tablet 30 mg (has no administration in time range)  iopamidol (ISOVUE-370) 76 % injection 100 mL (100 mLs Intravenous Contrast Given 01/24/18 2316)     Initial Impression / Assessment and Plan / ED Course  I have reviewed the triage vital signs and the nursing notes.  Pertinent labs & imaging results that were available during my care of the patient were reviewed by me and considered in my medical decision making (see chart for details).     Patient presenting for evaluation of anxiety.  Physical exam shows patient with pleuritic lung sounds.  History concerning for worsening shortness of breath with ambulation.  While her symptoms are likely due to anxiety, will obtain CTA to rule out PE prior to medical clearance.  Otherwise, medical screening labs and urine obtained.   CTA shows emphysema without PE.  Labs reassuring.  At this time, patient is medically cleared for TTS evaluation.  Home meds re-ordered per epic med list.   Final Clinical Impressions(s) / ED Diagnoses   Final diagnoses:  Anxiety    ED Discharge Orders    None       Franchot Heidelberg, PA-C 01/24/18 2351    Valarie Merino, MD 01/25/18 [phone]

## 2018-01-24 NOTE — ED Notes (Signed)
Patient transported to CT 

## 2018-01-24 NOTE — ED Notes (Signed)
Pt undressed into scrubs and placed all belongings into pt belonging bag. Bag is tagged with green label with pt label on bag. Pt requested that she leave her credit card and cash with visitor, this Probation officer saw pt leave credit card and cash with visitor and then took the rest of pt's belongings out of room; belongings are at nurses station in triage at this time.

## 2018-01-24 NOTE — ED Notes (Signed)
Caregiver stated "I just spoke with her sister, Karena Addison, & she is requesting someone call her because she is the Guardian.  She doesn't want any of her meds changed or stopped."  [address] [phone]

## 2018-01-24 NOTE — ED Triage Notes (Signed)
Pt is a resident @ [facility].  Pt stated "I feel like my meds aren't right.  I've been taking clozaril for a long time.  I saw my therapist last Thursday @ [facility].  The people @ [facility] thought I needed to be seen because they're liable."  Pt denies SI/HI.

## 2018-01-25 MED ORDER — CLOZAPINE 100 MG PO TABS
100.0000 mg | ORAL_TABLET | Freq: Two times a day (BID) | ORAL | Status: DC
  Filled 2018-01-25: qty 1

## 2018-01-25 MED ORDER — NICOTINE 21 MG/24HR TD PT24
21.0000 mg | MEDICATED_PATCH | Freq: Once | TRANSDERMAL | Status: DC
  Administered 2018-01-25: 21 mg via TRANSDERMAL
  Filled 2018-01-25: qty 1

## 2018-01-25 NOTE — Discharge Instructions (Addendum)
Follow up with your psychiatrist for further medication management.  Return to the ER with any new or worsening symptoms.

## 2018-01-25 NOTE — BH Assessment (Addendum)
Assessment Note  Jean Williams is an 57 y.o. female, who presents voluntary and unaccompanied to [facility]. Clinician asked the pt, "what brought you to the hospital?" Pt reported, "people at my house suggested I get checked out." Pt reported, for the past three weeks her anxiety has increased. Pt reported, she feels off balance when trying to walk. Pt reported, she feels liked a computer that is locked up. Pt reported, due to her increased anxiety she can not return to work. Pt reported, having panic attacks, daily. Pt denies, SI, HI, AVH, self-injurious behaviors and access to weapons.   Pt denies abuse. Pt reported, smoking a pack, daily. Pt's UDS is negative. Pt is linked to Dr. Sherilyn Dacosta at [facility] for medication management. Pt is linked to [place] at the [facility] for counseling. Pt reported, previous inpatient admissions during adolescences at [facility].   Pt presents alert, restless with logical/coherent speech. Pt's eye contact was good. Pt's mood/affect was anxious. Pt's thought process was coherent relevant. Pt's judgement was unimpaired. Pt was oriented x4. Pt's concentration was normal. Pt's insight ad impulse control are good. Pt reported, if discharged from [facility] she could contract for safety.   Diagnosis:  F41.1 Generalized Anxiety Disorder.  Past Medical History:  Past Medical History:  Diagnosis Date  . Allergy   . Anxiety   . Asthma    per pt, doesn"t have asthma  . 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 Basal cell carcinoma, lip 05/13/2011   Left upper lip. Treated with Mohs micrographic surgery by Dr. Tonita Cong MD. On 08/22/11.    Marland Kitchen GERD (gastroesophageal reflux disease)   . Hyperlipidemia   . Panic attacks   . 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 Benign per Derm. Dr. Lajean Saver.    . Schizophrenia ([place])   . Smoker   . Tachycardia     Past Surgical History:   Procedure Laterality Date  . COLONOSCOPY    . gyn surgery     for pre cancerous cells  . skin cancer removal     from lip    Family History:  Family History  Problem Relation Age of Onset  . Cancer Sister   . Breast cancer Sister   . Cancer Maternal Grandmother   . Breast cancer Maternal Grandmother   . Stroke Other   . Heart failure Other   . Colon cancer Neg Hx   . Colon polyps Neg Hx   . Rectal cancer Neg Hx   . Stomach cancer Neg Hx     Social History:  reports that she has been smoking cigarettes.  She has been smoking about 1.00 pack per day. She has never used smokeless tobacco. She reports that she does not drink alcohol or use drugs.  Additional Social History:  Alcohol / Drug Use Pain Medications: See MAR Prescriptions: See MAR Over the Counter: See MAR History of alcohol / drug use?: Yes Substance #1 Name of Substance 1: Cigarettes.  1 - Age of First Use: Pt reported, age 49.  1 - Amount (size/oz): Pt reported, smoking a pack, daily.  1 - Frequency: Daily.  1 - Duration: Ongoing. 1 - Last Use / Amount: Pt reported, daily.   CIWA: CIWA-Ar BP: (!) 139/94 Pulse Rate: (!) 102 COWS:    Allergies:  Allergies  Allergen Reactions  . Penicillins     REACTION: unspecified/ whelts  Home Medications:  (Not in a hospital admission)  OB/GYN Status:  Patient's last menstrual period was 06/10/2011.  General Assessment Data Location of Assessment: [facility] TTS Assessment: In system Is this a Tele or Face-to-Face Assessment?: Face-to-Face Is this an Initial Assessment or a Re-assessment for this encounter?: Initial Assessment Marital status: Single Is patient pregnant?: No Pregnancy Status: No Living Arrangements: Group Home(Family Care Home. ) Can pt return to current living arrangement?: Yes Admission Status: Voluntary Is patient capable of signing voluntary admission?: No(Pt has legal guardian. ) Referral Source: Other(Group home. ) Insurance type:  Medicare.     Crisis Care Plan Living Arrangements: Group Home(Family Care Home. ) Legal Guardian: Other:(Per pt sister and brother. ) Name of Psychiatrist: Dr. Pricilla Larsson at [facility]. Name of Therapist: Karsten Fells at the [facility].  Education Status Is patient currently in school?: No Is the patient employed, unemployed or receiving disability?: Receiving disability income  Risk to self with the past 6 months Suicidal Ideation: No(Pt denies. ) Has patient been a risk to self within the past 6 months prior to admission? : No(Pt denies. ) Suicidal Intent: No(Pt denies. ) Has patient had any suicidal intent within the past 6 months prior to admission? : No(Pt denies. ) Is patient at risk for suicide?: No(Pt denies. ) Suicidal Plan?: No(Pt denies. ) Has patient had any suicidal plan within the past 6 months prior to admission? : No(Pt denies. ) Access to Means: No(Pt denies. ) What has been your use of drugs/alcohol within the last 12 months?: UDS is negative. Previous Attempts/Gestures: Yes How many times?: (Pt said once or twice years and years ago. ) Other Self Harm Risks: Pt denies.  Triggers for Past Attempts: Unknown Intentional Self Injurious Behavior: None(Pt denies. ) Family Suicide History: Yes(Cousin.) Recent stressful life event(s): Other (Comment)(Pt reported, not being able to return to work due to anxiety) Persecutory voices/beliefs?: No Depression: Yes Depression Symptoms: Feeling worthless/self pity, Guilt, Isolating, Fatigue Substance abuse history and/or treatment for substance abuse?: No Suicide prevention information given to non-admitted patients: Not applicable  Risk to Others within the past 6 months Homicidal Ideation: No(Pt denies. ) Does patient have any lifetime risk of violence toward others beyond the six months prior to admission? : No(Pt denies. ) Thoughts of Harm to Others: No(Pt denies. ) Current Homicidal Intent: No(Pt  denies. ) Current Homicidal Plan: No(Pt denies. ) Access to Homicidal Means: No(Pt denies. ) Identified Victim: NA History of harm to others?: No(Pt denies. ) Assessment of Violence: None Noted Violent Behavior Description: NA Does patient have access to weapons?: No(Pt denies. ) Criminal Charges Pending?: No Does patient have a court date: No Is patient on probation?: No  Psychosis Hallucinations: None noted Delusions: None noted  Mental Status Report Appearance/Hygiene: In scrubs Eye Contact: Good Motor Activity: Unremarkable Speech: Logical/coherent Level of Consciousness: Alert, Restless Mood: Anxious Affect: Anxious Anxiety Level: Panic Attacks Panic attack frequency: Pt reported, having panic attacks, daily.  Most recent panic attack: Pt reported, having panic attacks, daily.  Thought Processes: Coherent, Relevant Judgement: Unimpaired Orientation: Person, Place, Time, Situation Obsessive Compulsive Thoughts/Behaviors: None  Cognitive Functioning Concentration: Normal Memory: Recent Intact Is patient IDD: No Is patient DD?: No Insight: Good Impulse Control: Good Appetite: Good Sleep: Increased Total Hours of Sleep: 11 Vegetative Symptoms: Staying in bed  ADLScreening [facility]) Patient's cognitive ability adequate to safely complete daily activities?: Yes Patient able to express need for assistance with ADLs?: Yes Independently performs  ADLs?: Yes (appropriate for developmental age)  Prior Inpatient Therapy Prior Inpatient Therapy: Yes Prior Therapy Dates: Pt reported, when she an adolescent.  Prior Therapy Facilty/Provider(s): [facility].  Reason for Treatment: Pt reported, she does not remember.   Prior Outpatient Therapy Prior Outpatient Therapy: Yes Prior Therapy Dates: Current.  Prior Therapy Facilty/Provider(s): [facility] Reason for Treatment: Medication management and counseling.  Does patient  have an ACCT team?: No Does patient have Intensive In-House Services?  : No Does patient have [facility] services? : No Does patient have P4CC services?: No  ADL Screening (condition at time of admission) Patient's cognitive ability adequate to safely complete daily activities?: Yes Is the patient deaf or have difficulty hearing?: No Does the patient have difficulty seeing, even when wearing glasses/contacts?: Yes(Pt wears glasses. ) Does the patient have difficulty concentrating, remembering, or making decisions?: Yes Patient able to express need for assistance with ADLs?: Yes Does the patient have difficulty dressing or bathing?: No Independently performs ADLs?: Yes (appropriate for developmental age) Does the patient have difficulty walking or climbing stairs?: Yes(Pt reported, using a cane. ) Weakness of Legs: None Weakness of Arms/Hands: None  Home Assistive Devices/Equipment Home Assistive Devices/Equipment: Cane (specify quad or straight)    Abuse/Neglect Assessment (Assessment to be complete while patient is alone) Abuse/Neglect Assessment Can Be Completed: Yes Physical Abuse: Denies(Pt denies. ) Verbal Abuse: Denies(Pt denies. ) Sexual Abuse: Denies(V) Exploitation of patient/patient's resources: Denies(V) Self-Neglect: Denies(Pt denies. )     Advance Directives (For Healthcare) Does Patient Have a Medical Advance Directive?: No Would patient like information on creating a medical advance directive?: No - Patient declined    Additional Information 1:1 In Past 12 Months?: No CIRT Risk: No Elopement Risk: No Does patient have medical clearance?: Yes     Disposition: Patriciaann Clan, PA recommends discharge and to follow up with OPT providers. Disposition discussed with [place], [place] and [place] Therapist, sports.   Disposition Initial Assessment Completed for this Encounter: Yes Disposition of Patient: Discharge  On Site Evaluation by: Vertell Novak, MS, LPC, CRC. Reviewed with  Physician: Jillyn Ledger, [place] and Patriciaann Clan, PA.    Vertell Novak 01/25/2018 1:18 AM   Vertell Novak, MS, [facility], [facility] Triage Specialist [phone]

## 2018-02-02 ENCOUNTER — Telehealth: Payer: Self-pay | Admitting: Family Medicine

## 2018-02-02 NOTE — Telephone Encounter (Signed)
Administrator (April Thompson) from [facility] care home came in office and dropped a Physicians order for the Pt, requesting to be signed by MD. Administrator is requesting if this can be fax when it's done and make a copy to pick up. Fax # [phone]. Best phone # to contact is [phone]. Form was placed in  [facility] team folder.

## 2018-02-02 NOTE — Telephone Encounter (Signed)
Clinical info completed on Med form.  Place form in Dr. Cecille Rubin' box for completion.  Jean Williams, [place]

## 2018-02-09 ENCOUNTER — Encounter: Payer: Self-pay | Admitting: Family Medicine

## 2018-02-09 ENCOUNTER — Other Ambulatory Visit: Payer: Self-pay

## 2018-02-09 ENCOUNTER — Ambulatory Visit (HOSPITAL_COMMUNITY)
Admission: RE | Admit: 2018-02-09 | Discharge: 2018-02-09 | Disposition: A | Discharge status: Home / Self Care | Payer: Medicare Other | Source: Ambulatory Visit | Attending: Family Medicine | Admitting: Family Medicine

## 2018-02-09 ENCOUNTER — Ambulatory Visit (INDEPENDENT_AMBULATORY_CARE_PROVIDER_SITE_OTHER): Payer: Medicare Other | Admitting: Family Medicine

## 2018-02-09 VITALS — BP 105/62 | HR 120 | Temp 98.3°F | Wt 180.0 lb

## 2018-02-09 DIAGNOSIS — I951 Orthostatic hypotension: Secondary | ICD-10-CM

## 2018-02-09 NOTE — Progress Notes (Signed)
    Subjective:  Jean Williams is a 57 y.o. female who presents to the [facility] today with a chief complaint of dizziness. Patinet is alone. She normally has a caregiver with her who did not accompany her today.   HPI:  Now back on clozaril on 300mg  total a day, used to be on 400mg  total a day. Has been on this medication for 40 years.  Cannot cope with the side effects. When stands up blood rushes to head and feet, scared she can't walk when this happens. Feels dizzy when this happens No CP, palpitations, SOB, nausea, presyncope, falls Sees psychology [place] for counseling at [facility] which is clubhouse for mentally ill,  Follows with [facility], Dr. Sherilyn Dacosta psychiatry for medication management, last saw 1-2 weeks ago after her ED visit on 01/25/18. She states that he told her that she would just have to cope with the side effects. Plans go back and see him at the end of August.  ROS: Per HPI  Objective:  Physical Exam: BP 105/62   Pulse (!) 120   Temp 98.3 F (36.8 C) (Oral)   Wt 180 lb (81.6 kg)   LMP 06/10/2011   SpO2 97%   BMI 29.05 kg/m    Orthostatic VS for the past 24 hrs:  BP- Lying Pulse- Lying BP- Sitting Pulse- Sitting BP- Standing at 0 minutes Pulse- Standing at 0 minutes  02/09/18 1452 120/82 109 105/78 116 - -  02/09/18 1406 120/82 109 105/78 116 110/80 124    Gen: NAD, resting comfortably CV: tachycardic, regular with no murmurs appreciated Pulm: NWOB, CTAB with no crackles, wheezes, or rhonchi GI: Normal bowel sounds present. Soft, Nontender, Nondistended. MSK: no edema, cyanosis, or clubbing noted Skin: warm, dry Neuro: grossly normal, moves all extremities  GAD 7 : Generalized Anxiety Score 02/09/2018  Nervous, Anxious, on Edge 3  Control/stop worrying 2  Worry too much - different things 3  Trouble relaxing 3  Restless 2  Easily annoyed or irritable 0  Afraid - awful might happen 3  Total GAD 7 Score 16  Anxiety Difficulty Very difficult     EKG: sinus tachycardia  Assessment/Plan:  No problem-specific Assessment & Plan notes found for this encounter.   Bufford Lope, DO PGY-3, [facility] 02/09/2018 1:50 PM

## 2018-02-09 NOTE — Patient Instructions (Addendum)
Stay well hydrated.   Be careful when standing up, take it slowly to avoid falls  Talk to your psychiatrist about your clozaril causing tachycardia (fast heartbeat) and orthostatic hypotension (dip in blood pressure).     Hypotension As your heart beats, it forces blood through your body. This force is called blood pressure. If you have hypotension, you have low blood pressure. When your blood pressure is too low, you may not get enough blood to your brain. You may feel weak, feel light-headed, have a fast heartbeat, or even pass out (faint). Follow these instructions at home: Eating and drinking  Drink enough fluids to keep your pee (urine) clear or pale yellow.  Eat a healthy diet, and follow instructions from your doctor about eating or drinking restrictions. A healthy diet includes: ? Fresh fruits and vegetables. ? Whole grains. ? Low-fat (lean) meats. ? Low-fat dairy products.  Eat extra salt only as told. Do not add extra salt to your diet unless your doctor tells you to.  Eat small meals often.  Avoid standing up quickly after you eat. Medicines  Take over-the-counter and prescription medicines only as told by your doctor. ? Follow instructions from your doctor about changing how much you take (the dosage) of your medicines, if this applies. ? Do not stop or change your medicine on your own. General instructions  Wear compression stockings as told by your doctor.  Get up slowly from lying down or sitting.  Avoid hot showers and a lot of heat as told by your doctor.  Return to your normal activities as told by your doctor. Ask what activities are safe for you.  Do not use any products that contain nicotine or tobacco, such as cigarettes and e-cigarettes. If you need help quitting, ask your doctor.  Keep all follow-up visits as told by your doctor. This is important. Contact a doctor if:  You throw up (vomit).  You have watery poop (diarrhea).  You have a fever  for more than 2-3 days.  You feel more thirsty than normal.  You feel weak and tired. Get help right away if:  You have chest pain.  You have a fast or irregular heartbeat.  You lose feeling (get numbness) in any part of your body.  You cannot move your arms or your legs.  You have trouble talking.  You get sweaty or feel light-headed.  You faint.  You have trouble breathing.  You have trouble staying awake.  You feel confused. This information is not intended to replace advice given to you by your health care provider. Make sure you discuss any questions you have with your health care provider. Document Released: 09/17/2009 Document Revised: 03/11/2016 Document Reviewed: 03/11/2016 Elsevier Interactive Patient Education  2017 [employer].

## 2018-02-09 NOTE — Assessment & Plan Note (Signed)
Patient has orthostatic hypotension and tachycardia from her clozaril. Given her significant psychiatric disease and recent follow up with her psychiatrist who decreased her dose, will not adjust medication. EKG is reassuring as it only shows sinus tach without other arrhythmias. Advised good po hydration and recommended caution/taking it slow when sitting or standing up. Given return precautions and told patient to follow up with her psychiatrist. She voiced good understanding and a print out was given to her for her caregiver to review.

## 2018-02-11 ENCOUNTER — Other Ambulatory Visit: Payer: Self-pay | Admitting: Family Medicine

## 2018-02-11 DIAGNOSIS — R7989 Other specified abnormal findings of blood chemistry: Secondary | ICD-10-CM

## 2018-02-11 DIAGNOSIS — Z0000 Encounter for general adult medical examination without abnormal findings: Secondary | ICD-10-CM

## 2018-02-11 DIAGNOSIS — F209 Schizophrenia, unspecified: Secondary | ICD-10-CM

## 2018-02-11 DIAGNOSIS — R799 Abnormal finding of blood chemistry, unspecified: Secondary | ICD-10-CM

## 2018-02-11 NOTE — Telephone Encounter (Signed)
I completed the form for Ms. O'Neill on 8-8.  Please find the form in the pocket above Tomika's his desk.

## 2018-02-12 ENCOUNTER — Other Ambulatory Visit: Payer: Medicare Other

## 2018-02-12 DIAGNOSIS — R7989 Other specified abnormal findings of blood chemistry: Secondary | ICD-10-CM

## 2018-02-12 NOTE — Telephone Encounter (Signed)
Form faxed back as requested to [phone].  Pt also has a Lab appt today. Will send copy with her as well. Fleeger, Salome Spotted, CMA

## 2018-02-13 LAB — VITAMIN B12: VITAMIN B 12: 363 pg/mL (ref 232–1245)

## 2018-02-18 ENCOUNTER — Other Ambulatory Visit: Payer: Self-pay | Admitting: *Deleted

## 2018-02-18 MED ORDER — FLUTICASONE PROPIONATE 50 MCG/ACT NA SUSP: NASAL | 0 refills | Status: DC

## 2018-03-05 ENCOUNTER — Telehealth: Payer: Self-pay | Admitting: Family Medicine

## 2018-03-05 NOTE — Telephone Encounter (Signed)
April Thompson, member of [facility], came in to collect copies of recent lab work, vitamin b 12 for patient. Per chart review, ok to give her a copy.

## 2018-03-09 ENCOUNTER — Other Ambulatory Visit: Payer: Self-pay | Admitting: Family Medicine

## 2018-03-09 DIAGNOSIS — Z1231 Encounter for screening mammogram for malignant neoplasm of breast: Secondary | ICD-10-CM

## 2018-04-13 ENCOUNTER — Encounter: Payer: Self-pay | Admitting: Family Medicine

## 2018-04-13 ENCOUNTER — Ambulatory Visit (INDEPENDENT_AMBULATORY_CARE_PROVIDER_SITE_OTHER): Payer: Medicare Other | Admitting: Family Medicine

## 2018-04-13 ENCOUNTER — Telehealth: Payer: Self-pay | Admitting: *Deleted

## 2018-04-13 ENCOUNTER — Encounter: Payer: Medicare Other | Admitting: Family Medicine

## 2018-04-13 ENCOUNTER — Other Ambulatory Visit: Payer: Self-pay

## 2018-04-13 VITALS — BP 101/62 | HR 118 | Temp 98.1°F | Wt 176.0 lb

## 2018-04-13 DIAGNOSIS — Z23 Encounter for immunization: Secondary | ICD-10-CM | POA: Diagnosis not present

## 2018-04-13 DIAGNOSIS — Z79899 Other long term (current) drug therapy: Secondary | ICD-10-CM

## 2018-04-13 DIAGNOSIS — Z1159 Encounter for screening for other viral diseases: Secondary | ICD-10-CM | POA: Diagnosis not present

## 2018-04-13 DIAGNOSIS — E785 Hyperlipidemia, unspecified: Secondary | ICD-10-CM | POA: Diagnosis not present

## 2018-04-13 DIAGNOSIS — F209 Schizophrenia, unspecified: Secondary | ICD-10-CM | POA: Diagnosis present

## 2018-04-13 NOTE — Progress Notes (Signed)
Patient ID: [name], female   DOB: 1961-06-01, 57 y.o.   MRN: [id number]  Chief Complaint  Patient presents with  . Annual Exam    HPI Jean Williams is a 57 y.o. female.  We discussed the following medical conditions:  Schizophrenia: [name] has been treated for schizophrenia for several decades now and has been on clozapine for years.  She noted that her family saw remarkable improvement when she began taking clozapine and she has not had any significant issues related to adverse effects of clozapine.  This medication is managed by her psychiatrist.  She recently had a CBC with differential drawn however those results are not available in our records.  Dyslipidemia: She is taking atorvastatin for high cholesterol which is related to her clozapine use.  She reports no muscle aches or pains.  Last lipid panel on file from 2015.  Health maintenance: We reviewed the list of medications and ensure that we have the correct doses for each of them.  She has never been tested for hep C so far she knows in our records show.  She has not ever received a Tdap further records show.  She would like to get a flu shot. She is active smoker and smokes about 1 pack a day.  We discussed stopping/cutting back she is not interested at this time.   Past Medical History:  Diagnosis Date  . Allergy   . Anxiety   . Asthma    per pt, doesn"t have asthma  . 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 Basal cell carcinoma, lip 05/13/2011   Left upper lip. Treated with Mohs micrographic surgery by Dr. Tonita Cong MD. On 08/22/11.    Marland Kitchen GERD (gastroesophageal reflux disease)   . Hyperlipidemia   . Panic attacks   . 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 Benign per Derm. Dr. Lajean Williams.    . Schizophrenia (Jean Williams)   . Smoker   . Tachycardia     Past Surgical History:  Procedure Laterality Date  . COLONOSCOPY    . gyn surgery      for pre cancerous cells  . skin cancer removal     from lip    Family History  Problem Relation Age of Onset  . Cancer Sister   . Breast cancer Sister   . Cancer Maternal Grandmother   . Breast cancer Maternal Grandmother   . Stroke Other   . Heart failure Other   . Colon cancer Neg Hx   . Colon polyps Neg Hx   . Rectal cancer Neg Hx   . Stomach cancer Neg Hx     Social History Social History   Tobacco Use  . Smoking status: Current Every Day Smoker    Packs/day: 1.00    Types: Cigarettes  . Smokeless tobacco: Never Used  Substance Use Topics  . Alcohol use: No    Alcohol/week: 0.0 standard drinks  . Drug use: No    Allergies  Allergen Reactions  . Penicillins Hives    REACTION: unspecified/ whelts  Has patient had a PCN reaction causing immediate rash, facial/tongue/throat swelling, SOB or lightheadedness with hypotension: Yes Has patient had a PCN reaction causing severe rash involving mucus membranes or skin necrosis: No Has patient had a PCN reaction that required hospitalization: No Has patient had a PCN reaction occurring within the last 10 years: no If all of the above  answers are "NO", then may proceed with Cephalosporin use.      Current Outpatient Medications  Medication Sig Dispense Refill  . acetaminophen (TYLENOL) 325 MG tablet Take 2 tablets (650 mg total) by mouth every 6 (six) hours as needed. 30 tablet 0  . albuterol (PROVENTIL HFA;VENTOLIN HFA) 108 (90 Base) MCG/ACT inhaler Inhale 1-2 puffs into the lungs every 6 (six) hours as needed for wheezing or shortness of breath. 2 Inhaler 2  . Artificial Tear Ointment (ARTIFICIAL TEARS) ointment Place into both eyes as needed. Keep at bedside 3.5 g 12  . atorvastatin (LIPITOR) 40 MG tablet Take 1 tablet (40 mg total) by mouth at bedtime. 90 tablet 3  . calcium-vitamin D (OSCAL WITH D) 500-200 MG-UNIT TABS tablet TAKE 1 TABLET BY MOUTH TWICE DAILY. 60 tablet 0  . carboxymethylcellul-glycerin  (OPTIVE) 0.5-0.9 % ophthalmic solution Place 1 drop into both eyes 4 (four) times daily. Cannot self-administer 15 mL 0  . cloZAPine (CLOZARIL) 100 MG tablet Take 1 tablet (100 mg total) by mouth 2 (two) times daily. Take 1/2 tab every am & 3 tabs every evening (Patient taking differently: Take 100 mg by mouth 3 (three) times daily. Take 1/2 tab every am & 3 tabs every evening) 105 tablet 3  . Colloidal Oatmeal (EUCERIN ECZEMA RELIEF) 1 % CREA Apply to affected skin twice daily 57 g 0  . famotidine (PEPCID) 40 MG tablet TAKE 1/2 TABLET (=TO A 20MG  DOSE) BY MOUTH TWICE DAILY. (BEFORE MEALS) 30 tablet 0  . fluticasone (FLONASE) 50 MCG/ACT nasal spray INAHALE 2 SPRAYS INTO EACH NOSTRIL ONCE DAILY. 16 g 0  . fluticasone (FLOVENT HFA) 110 MCG/ACT inhaler INHALE 1 PUFF BY MOUTH TWICE DAILY. RINSE MOUTH AFTER USE. 12 g 0  . loratadine (CLARITIN) 10 MG tablet TAKE 1 TABLET BY MOUTH ONCE DAILY. 30 tablet 0  . Multiple Vitamins-Minerals (CERTAVITE/ANTIOXIDANTS) TABS TAKE 1 TABLET BY MOUTH ONCE DAILY. REPLACES THERATRUM 30 tablet 0  . PARoxetine (PAXIL) 30 MG tablet TAKE 1 TABLET BY MOUTH IN THE MORNING. 30 tablet 0   No current facility-administered medications for this visit.     Review of Systems Review of Systems  All other systems reviewed and are negative.   Blood pressure 101/62, pulse (!) 118, temperature 98.1 F (36.7 C), temperature source Oral, weight 176 lb (79.8 kg), last menstrual period 06/10/2011, SpO2 97 %.  Physical Exam Physical Exam  Constitutional: She appears well-developed and well-nourished. No distress.  Eyes: Pupils are equal, round, and reactive to light. Conjunctivae are normal.  Cardiovascular: Normal heart sounds.  Pulmonary/Chest: Effort normal. No respiratory distress. She has wheezes (mild wheezing on auscultation). She has no rales.  Abdominal: Soft. Bowel sounds are normal. There is no tenderness.  Lymphadenopathy:    She has no cervical adenopathy.  Skin: Skin  is warm and dry. She is not diaphoretic.  Psychiatric: Her behavior is normal. Thought content normal.    Data Reviewed Past medical history, medications and recent labs are reviewed  Assessment   [name] is a 57 year old woman previous medical history significant for hernia being seen in clinic for her annual physical exam.    Plan   Schizophrenia: Stable.  Clozapine will be prescribed by her psychiatrist.  Will perform the following testing to monitor for adverse effects of clozapine: -CBC with differential -Lipid panel -A1c  Dyslipidemia: -Continue atorvastatin 40 mg daily -Lipid panel pending  Health maintenance: -Hepatitis C antibody screen with reflex -Tdap vaccination -Flu vaccine  [name] 04/13/2018, 5:42 PM

## 2018-04-13 NOTE — Telephone Encounter (Signed)
Pt needs a doctors note for appt today.   The cargiver would like it faxed to [phone].  Letter created and faxed as requested.  Fleeger, Salome Spotted, CMA

## 2018-04-13 NOTE — Assessment & Plan Note (Signed)
Stable.  Clozapine will be prescribed by her psychiatrist.  Will perform the following testing to monitor for adverse effects of clozapine: -CBC with differential -Lipid panel -A1c

## 2018-04-14 LAB — LIPID PANEL
CHOL/HDL RATIO: 4.4 ratio (ref 0.0–4.4)
Cholesterol, Total: 151 mg/dL (ref 100–199)
HDL: 34 mg/dL — ABNORMAL LOW (ref >39)
Triglycerides: 530 mg/dL — ABNORMAL HIGH (ref 0–149)

## 2018-04-14 LAB — CBC WITH DIFFERENTIAL/PLATELET
BASOS: 0 %
Basophils Absolute: 0 10*3/uL (ref 0.0–0.2)
EOS (ABSOLUTE): 0 10*3/uL (ref 0.0–0.4)
Eos: 0 %
HEMATOCRIT: 41.1 % (ref 34.0–46.6)
HEMOGLOBIN: 14.3 g/dL (ref 11.1–15.9)
IMMATURE GRANS (ABS): 0 10*3/uL (ref 0.0–0.1)
Immature Granulocytes: 0 %
LYMPHS: 34 %
Lymphocytes Absolute: 2.8 10*3/uL (ref 0.7–3.1)
MCH: 31.8 pg (ref 26.6–33.0)
MCHC: 34.8 g/dL (ref 31.5–35.7)
MCV: 91 fL (ref 79–97)
MONOCYTES: 7 %
Monocytes Absolute: 0.6 10*3/uL (ref 0.1–0.9)
Neutrophils Absolute: 4.7 10*3/uL (ref 1.4–7.0)
Neutrophils: 59 %
Platelets: 320 10*3/uL (ref 150–450)
RBC: 4.5 x10E6/uL (ref 3.77–5.28)
RDW: 13.7 % (ref 12.3–15.4)
WBC: 8.1 10*3/uL (ref 3.4–10.8)

## 2018-04-14 LAB — HCV COMMENT:

## 2018-04-14 LAB — HEPATITIS C ANTIBODY (REFLEX)

## 2018-04-27 ENCOUNTER — Encounter: Payer: Self-pay | Admitting: Family Medicine

## 2018-04-27 ENCOUNTER — Other Ambulatory Visit: Payer: Self-pay | Admitting: Family Medicine

## 2018-04-27 DIAGNOSIS — Z0000 Encounter for general adult medical examination without abnormal findings: Secondary | ICD-10-CM

## 2018-04-27 NOTE — Progress Notes (Signed)
LM with caregiver and she will check with scheduler and then call back to make a lab appt.  Jazmin Hartsell,CMA

## 2018-04-27 NOTE — Progress Notes (Signed)
Caregiver called back. Appt made for Thursday AM. Fleeger, Salome Spotted, [place]

## 2018-04-29 ENCOUNTER — Other Ambulatory Visit: Payer: Medicare Other

## 2018-04-29 DIAGNOSIS — Z0000 Encounter for general adult medical examination without abnormal findings: Secondary | ICD-10-CM

## 2018-04-30 ENCOUNTER — Ambulatory Visit
Admission: RE | Admit: 2018-04-30 | Discharge: 2018-04-30 | Disposition: A | Discharge status: Home / Self Care | Payer: Medicare Other | Source: Ambulatory Visit | Attending: Family Medicine | Admitting: Family Medicine

## 2018-04-30 ENCOUNTER — Telehealth: Payer: Self-pay | Admitting: *Deleted

## 2018-04-30 ENCOUNTER — Ambulatory Visit: Payer: Medicare Other

## 2018-04-30 ENCOUNTER — Encounter: Payer: Self-pay | Admitting: Family Medicine

## 2018-04-30 DIAGNOSIS — Z1231 Encounter for screening mammogram for malignant neoplasm of breast: Secondary | ICD-10-CM

## 2018-04-30 LAB — LIPID PANEL
CHOL/HDL RATIO: 3.6 ratio (ref 0.0–4.4)
CHOLESTEROL TOTAL: 123 mg/dL (ref 100–199)
HDL: 34 mg/dL — AB (ref >39)
LDL Calculated: 36 mg/dL (ref 0–99)
TRIGLYCERIDES: 265 mg/dL — AB (ref 0–149)
VLDL Cholesterol Cal: 53 mg/dL — ABNORMAL HIGH (ref 5–40)

## 2018-04-30 IMAGING — MG DIGITAL SCREENING BILATERAL MAMMOGRAM WITH TOMO AND CAD
8 series · 8 of 24 positions shown · non-contrast
Comparison: Previous exam(s).

CLINICAL DATA: Screening.

EXAM:
DIGITAL SCREENING BILATERAL MAMMOGRAM WITH TOMO AND CAD

[R CC synth-2D]
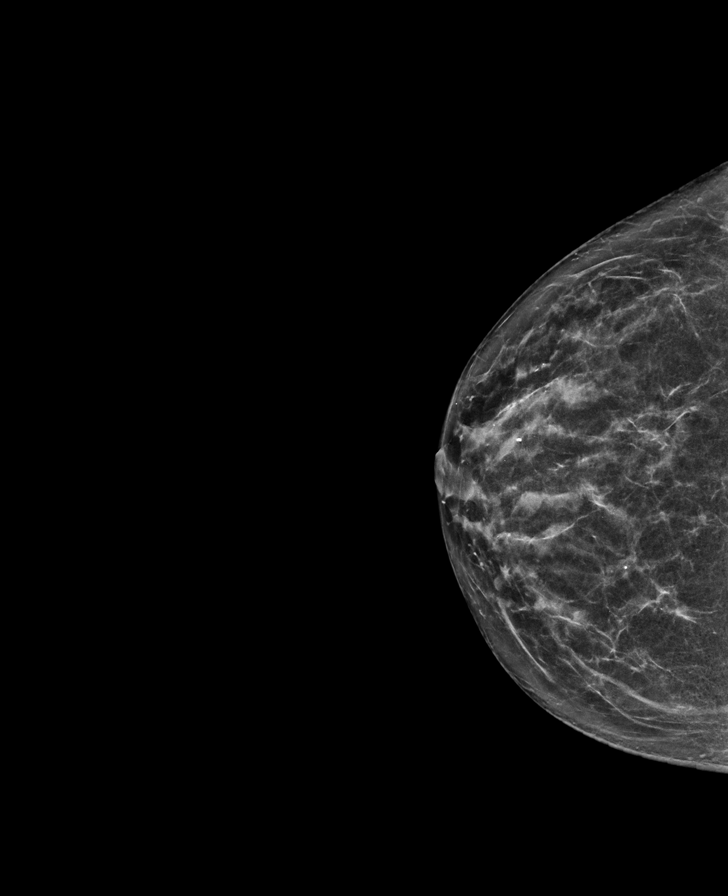

[L CC synth-2D]
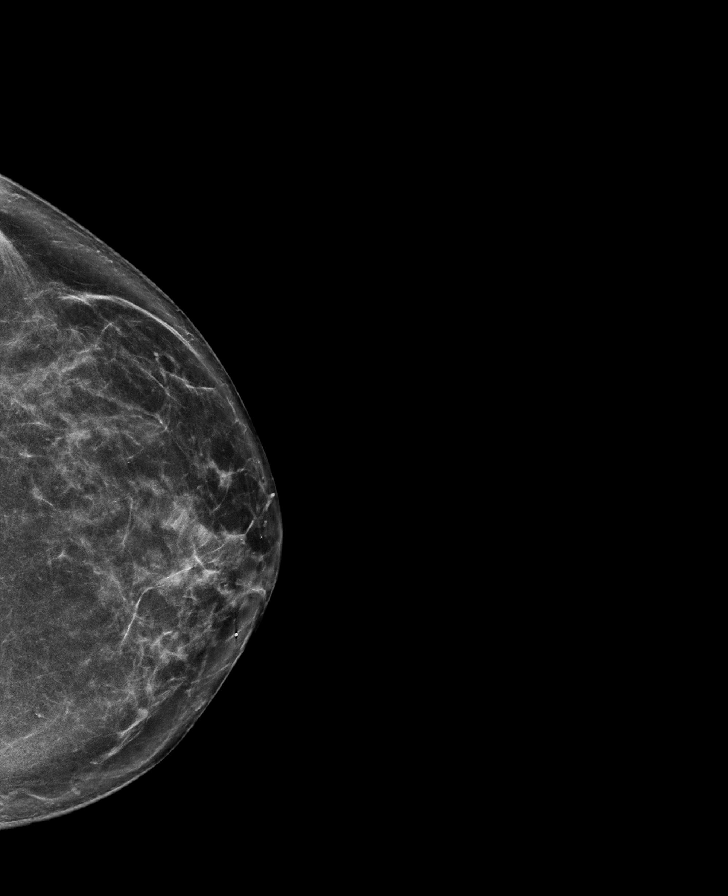

[R MLO synth-2D]
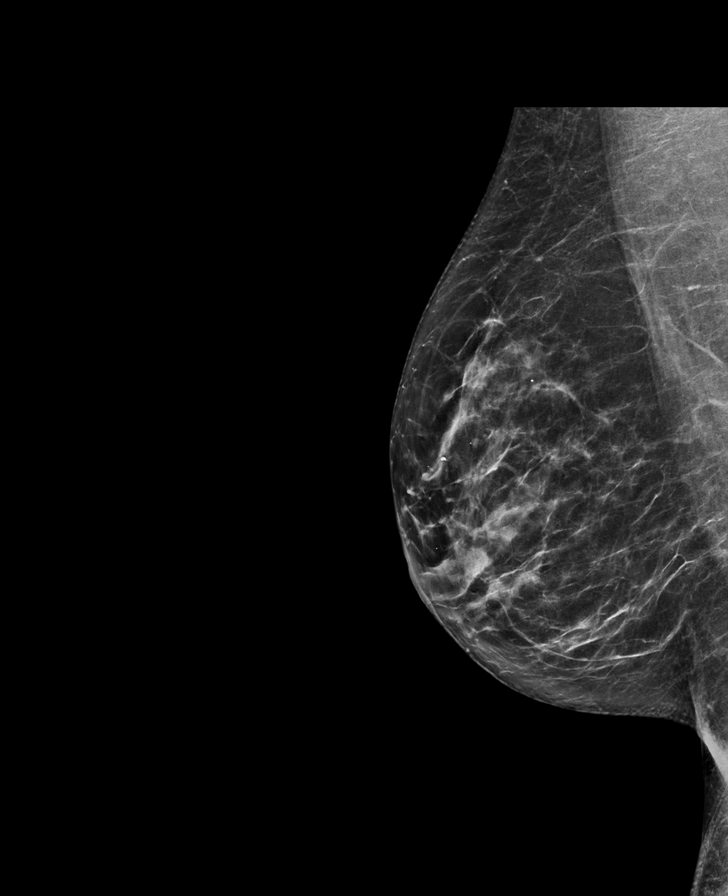

[L MLO synth-2D]
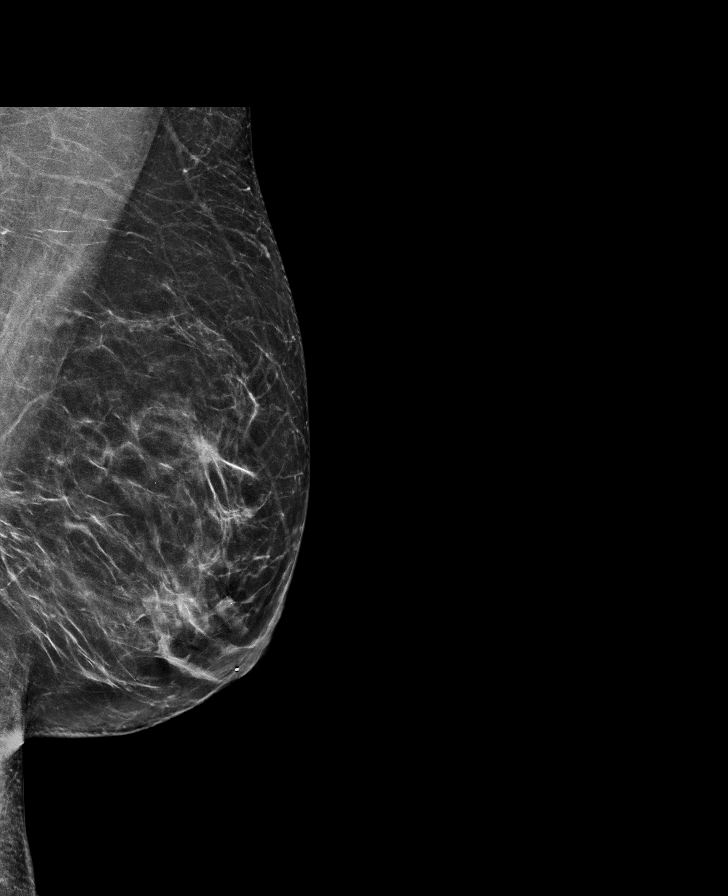

[L CC tomo · tomo slice 37/72.0]
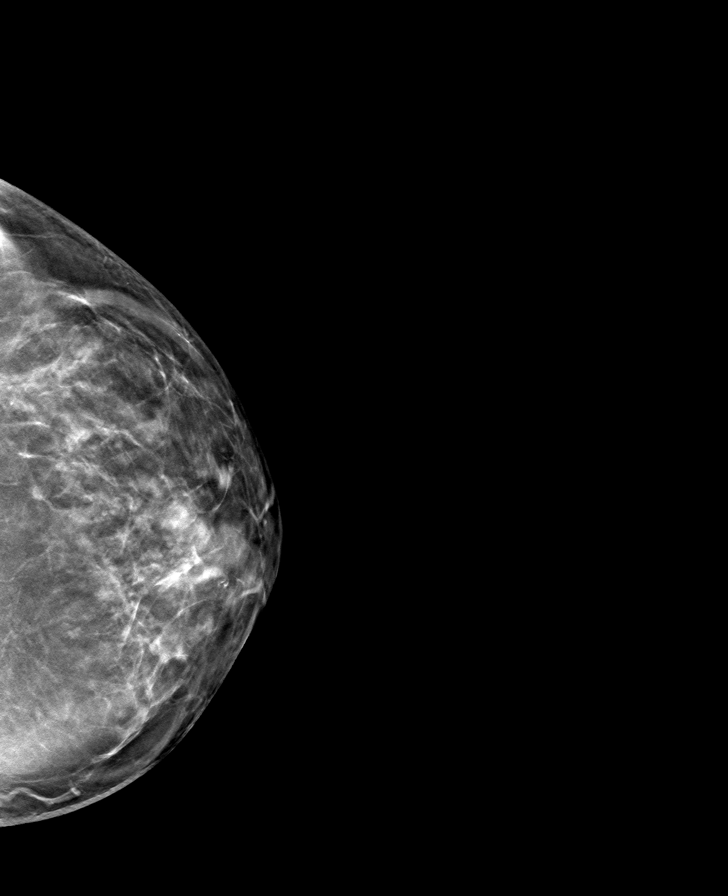

[L MLO tomo · tomo slice 38/75.0]
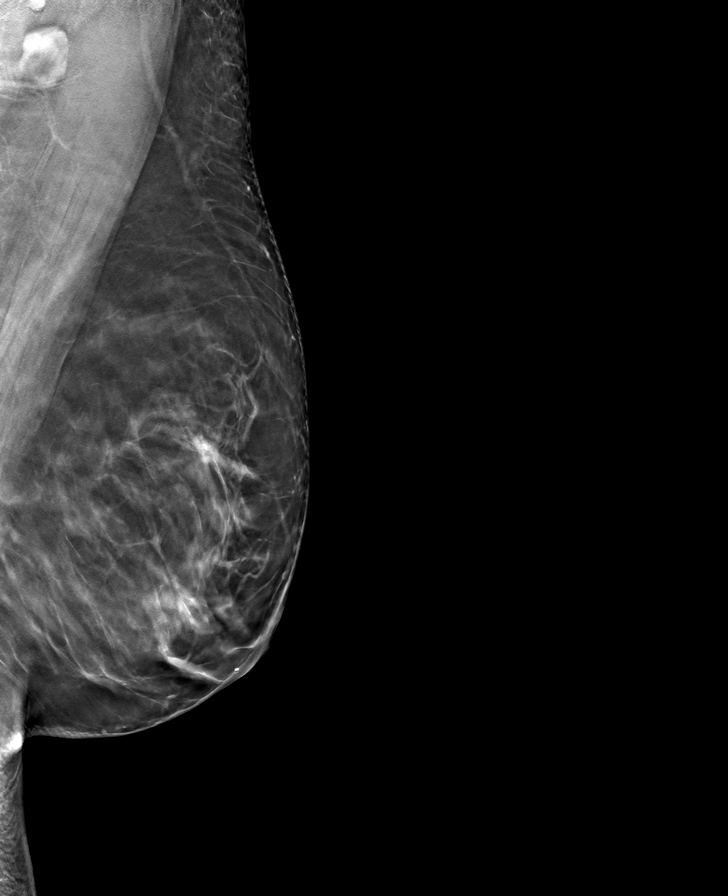

[R CC tomo · tomo slice 33/65.0]
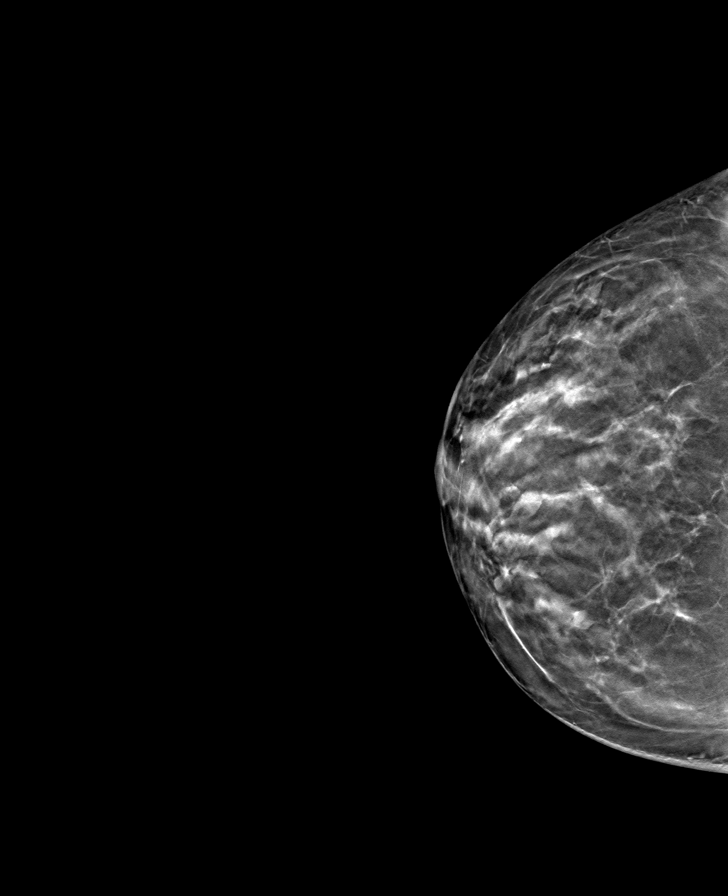

[R MLO tomo · tomo slice 37/73.0]
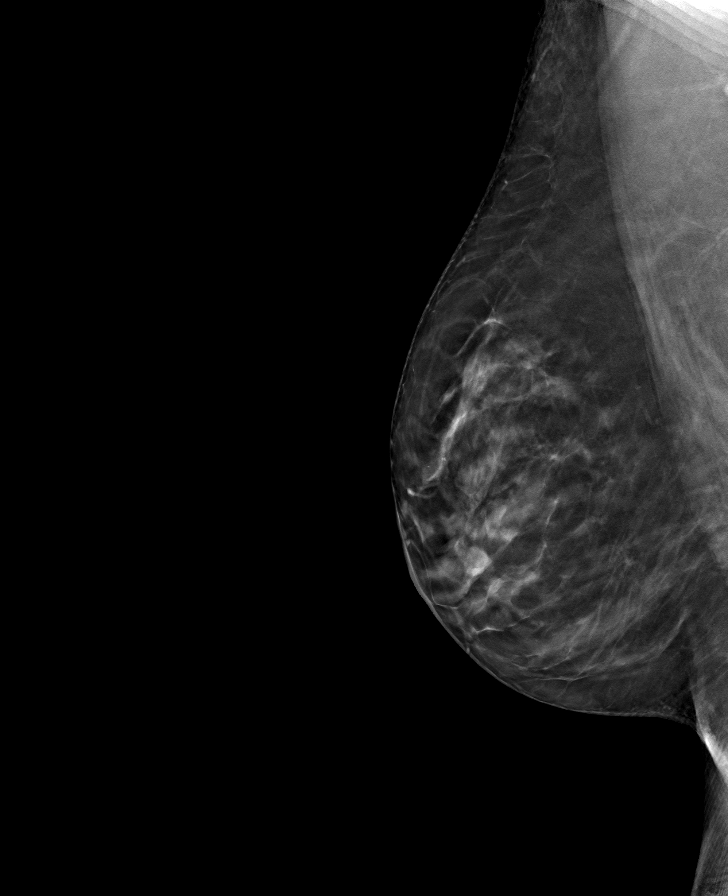

[8 of 24 positions shown; findings below may reference images not displayed]

ACR Breast Density Category b: There are scattered areas of
fibroglandular density.
FINDINGS: There are no findings suspicious for malignancy. Images were
processed with CAD.
IMPRESSION: No mammographic evidence of malignancy. A result letter of this
screening mammogram will be mailed directly to the patient.

RECOMMENDATION:
Screening mammogram in one year. (Code:[id number])

BI-RADS CATEGORY  1: Negative.

## 2018-04-30 NOTE — Telephone Encounter (Signed)
[facility] needs the following faxed to [phone]  1. A continuation order for tylenol 2. A d/c order for miralax 3. OVN from last appt  4. Letter stating that she was here for labs yesterday.   Dr. Pilar Plate, I will be happy to print 3 and 4 but need the medication orders from you. Jamear Carbonneau, Salome Spotted, CMA

## 2018-05-03 NOTE — Telephone Encounter (Signed)
Faxed as requested. Chelesa Weingartner, Salome Spotted, CMA

## 2018-05-03 NOTE — Telephone Encounter (Signed)
Written orders given to [facility].

## 2018-05-10 ENCOUNTER — Encounter: Payer: Medicare Other | Admitting: Family Medicine

## 2018-05-26 ENCOUNTER — Encounter: Payer: Self-pay | Admitting: Family Medicine

## 2018-05-26 ENCOUNTER — Other Ambulatory Visit: Payer: Self-pay

## 2018-05-26 ENCOUNTER — Ambulatory Visit (INDEPENDENT_AMBULATORY_CARE_PROVIDER_SITE_OTHER): Payer: Medicare Other | Admitting: Family Medicine

## 2018-05-26 DIAGNOSIS — R21 Rash and other nonspecific skin eruption: Secondary | ICD-10-CM

## 2018-05-26 MED ORDER — TRIAMCINOLONE ACETONIDE 0.025 % EX OINT: 1.0000 "application " | TOPICAL_OINTMENT | Freq: Two times a day (BID) | CUTANEOUS | 1 refills | Status: DC

## 2018-05-26 NOTE — Progress Notes (Signed)
Subjective  Jean Williams is a 57 y.o. female is presenting with the following RASH  Had rash for 10 or so days.  Only on her arms and around her neck Location: arms Medications tried: eucerin Similar rash in past: no Patient believes may be caused by a new body wash she use before the rash started  New medications or antibiotics: no - No new psychiatric meds Tick, Insect or new pet exposure: no Recent travel: no New detergent or soap: see above Immunocompromised: no  Symptoms Itching: yes Pain over rash: no Feeling ill all over: no Fever: no Mouth sores: no Face or tongue swelling: no Trouble breathing: no Joint swelling or pain: no  Review of Symptoms - see HPI PMH - Smoking status noted.      Chief Complaint noted Review of Symptoms - see HPI PMH - Smoking status noted.    Objective Vital Signs reviewed BP 120/80   Pulse (!) 127   Temp 98.6 F (37 C) (Oral)   Ht 5\' 6"  (1.676 m)   Wt 179 lb 6.4 oz (81.4 kg)   LMP 06/10/2011   SpO2 95%   BMI 28.96 kg/m  Psych:  Cognition and judgment appear intact. Alert, communicative  and cooperative with normal attention span and concentration. No apparent delusions, illusions, hallucinations Skin - red confluent raised irritated rash on anter arms and slightly around her neck Spares trunk and legs Mouth - no lesions, mucous membranes are moist, no decaying teeth      Assessments/Plans  See after visit summary for details of patient instuctions  No problem-specific Assessment & Plan notes found for this encounter.

## 2018-05-26 NOTE — Assessment & Plan Note (Signed)
New.  Consistent with contact dermatitis - treat with steroid ointment and avoid any scented soaps

## 2018-05-26 NOTE — Patient Instructions (Addendum)
Good to see you today!  Thanks for coming in.  For soap use Basis or Dove not any scented soaps or washes  Use the ointment twice a day every day for 2 weeks. If the rash does not go away then come back   In between the ointment if you itch use vaseline and rub but not scratch

## 2018-05-27 ENCOUNTER — Other Ambulatory Visit: Payer: Self-pay

## 2018-05-27 MED ORDER — TRIAMCINOLONE ACETONIDE 0.025 % EX OINT: 1.0000 "application " | TOPICAL_OINTMENT | Freq: Two times a day (BID) | CUTANEOUS | 1 refills | Status: DC

## 2018-05-27 NOTE — Telephone Encounter (Signed)
Triamcinolone re-sent to [facility] at patient's facility request.  Danley Danker, RN [facility] RN)

## 2018-06-11 ENCOUNTER — Other Ambulatory Visit: Payer: Self-pay | Admitting: Internal Medicine

## 2018-06-16 ENCOUNTER — Telehealth: Payer: Self-pay | Admitting: *Deleted

## 2018-06-16 NOTE — Telephone Encounter (Signed)
Group home needs a discontinuation order for the triamcinolone ointment for their records.  It can be faxed to [phone]. Breelynn Bankert, Salome Spotted, CMA

## 2018-06-22 NOTE — Telephone Encounter (Signed)
Group home called again. Rash has returned. Wants new order to use Triamcinolone ointment PRN instead of discontinuing it.  Call back is [phone]  Danley Danker, RN [facility])

## 2018-06-25 ENCOUNTER — Other Ambulatory Visit: Payer: Self-pay | Admitting: *Deleted

## 2018-06-25 ENCOUNTER — Ambulatory Visit (INDEPENDENT_AMBULATORY_CARE_PROVIDER_SITE_OTHER): Payer: Medicare Other | Admitting: *Deleted

## 2018-06-25 DIAGNOSIS — Z111 Encounter for screening for respiratory tuberculosis: Secondary | ICD-10-CM | POA: Diagnosis present

## 2018-06-25 NOTE — Progress Notes (Signed)
Patient is here for a PPD placement.  PPD placed in left forearm. Pt has eczema on the inside of both arms, found area with least amount of redness. Patient will return 06/28/18 @ 8:30 to have PPD read. Fleeger, Salome Spotted, CMA

## 2018-06-28 ENCOUNTER — Ambulatory Visit (INDEPENDENT_AMBULATORY_CARE_PROVIDER_SITE_OTHER): Payer: Medicare Other | Admitting: *Deleted

## 2018-06-28 DIAGNOSIS — Z23 Encounter for immunization: Secondary | ICD-10-CM | POA: Diagnosis not present

## 2018-06-28 LAB — TB SKIN TEST
INDURATION: 0 mm
TB Skin Test: NEGATIVE

## 2018-06-28 MED ORDER — TRIAMCINOLONE ACETONIDE 0.025 % EX OINT: 1.0000 "application " | TOPICAL_OINTMENT | Freq: Two times a day (BID) | CUTANEOUS | 1 refills | Status: DC | PRN

## 2018-06-28 NOTE — Progress Notes (Signed)
Patient is here for a PPD read.  It was placed on 06/25/18 in the right forearm @ 9:05 am.    PPD RESULTS:  Result: negative Induration: 0 mm  Letter created and given to patient for documentation purposes. Neria Procter, Salome Spotted, CMA   Pts group home also requested that pt have pneumovax.  Given and pt tolerated well. Valdis Bevill, Salome Spotted, CMA

## 2018-07-12 NOTE — Telephone Encounter (Signed)
Has this been addressed? Jean Williams, Jean Williams, CMA

## 2018-07-13 ENCOUNTER — Encounter: Payer: Self-pay | Admitting: Family Medicine

## 2018-07-13 ENCOUNTER — Other Ambulatory Visit: Payer: Self-pay

## 2018-07-13 ENCOUNTER — Ambulatory Visit (INDEPENDENT_AMBULATORY_CARE_PROVIDER_SITE_OTHER): Payer: Medicare Other | Admitting: Family Medicine

## 2018-07-13 VITALS — BP 118/64 | HR 120 | Temp 97.8°F | Ht 66.0 in | Wt 183.0 lb

## 2018-07-13 DIAGNOSIS — Z593 Problems related to living in residential institution: Secondary | ICD-10-CM | POA: Diagnosis present

## 2018-07-13 DIAGNOSIS — L84 Corns and callosities: Secondary | ICD-10-CM

## 2018-07-13 NOTE — Progress Notes (Signed)
    Subjective:  Jean Williams is a 58 y.o. female who presents to the [facility] today with a chief complaint of a bump on her finger.   HPI: Finger Callus: She noted this bump had been on her right index finger since she was a teenager.  It is since been a source of annoyance.  It protrudes slightly from the knuckle on her right hand.  She does not think that it has ever been infected or interferes with the use of her finger.  She has a similar callus on her right thumb in a position which opposes her index finger.  She would like to get the bump on her index finger removed. No fevers or muscle aches.  Chief Complaint noted Review of Symptoms - see HPI PMH - Smoking status noted.    Objective:  Physical Exam: BP 118/64   Pulse (!) 120   Temp 97.8 F (36.6 C) (Oral)   Ht 5\' 6"  (1.676 m)   Wt 83 kg   LMP 06/10/2011   SpO2 97%   BMI 29.54 kg/m    Gen: NAD, resting comfortably.  No acute distress. Pulm: Breathing comfortably on room air Skin: There is a prominent bump on the PIP of her right second finger.  This bump protrudes slightly from the radial aspect of her finger.  It is about 0.5 cm in diameter and is raised several millimeters.  It appears to be the same color as her skin.  Nontender to palpation.  No erythema or signs of infection.  No results found for this or any previous visit (from the past 72 hour(s)).   Assessment/Plan:  No problem-specific Assessment & Plan notes found for this encounter.  Callus on the first finger of right hand Physical exam and history are consistent with a potential callus this appears to be more likely than a wart which would not have remained from her teenage years.  We discussed shaving the callus and [name] felt comfortable with that procedure. -Callus shaved without complication

## 2018-07-13 NOTE — Patient Instructions (Addendum)
It was so nice to see you today! I'm glad we could take care of that today.  Please let us know if you have any trouble with redness or continued bleeding from that site.  If the callus on your thumb becomes bothersome, we can try to address that at a later visit. I've also attached your TB results which were negative for TB.

## 2018-07-15 ENCOUNTER — Ambulatory Visit: Payer: Medicare Other

## 2018-07-15 ENCOUNTER — Other Ambulatory Visit: Payer: Self-pay | Admitting: Family Medicine

## 2018-07-15 DIAGNOSIS — Z111 Encounter for screening for respiratory tuberculosis: Secondary | ICD-10-CM

## 2018-07-15 LAB — TB SKIN TEST
INDURATION: 0 mm
TB Skin Test: NEGATIVE

## 2018-07-15 NOTE — Progress Notes (Signed)
   Patient here today to have PPD site read.   PPD read and results entered in Epic. Result: 0 mm induration. Interpretation: Negative Letter given for group home along with copy of medication list.  Danley Danker, RN [facility] RN)

## 2018-07-15 NOTE — Telephone Encounter (Signed)
Will forward to MD so he is aware. Jazmin Hartsell,CMA

## 2018-07-15 NOTE — Telephone Encounter (Signed)
Patient needs this form signed doctor Owens Shark signed print out for visit today.  Need other form signed. Fax [phone]

## 2018-07-15 NOTE — Telephone Encounter (Signed)
Patient was seen today wrong paperwork signed. Needs to be signed by Dr. [name]. Social worker will pick up.

## 2018-07-16 ENCOUNTER — Telehealth: Payer: Self-pay | Admitting: Family Medicine

## 2018-07-16 NOTE — Telephone Encounter (Signed)
Patients case worker needs form signed by doctor she was seen on 07/13/18..  They will pick up.

## 2018-07-19 NOTE — Telephone Encounter (Signed)
Clinical info completed on order form.  Place form in Dr. Baxter Flattery box for completion.  Jean Williams, [place]

## 2018-07-22 NOTE — Telephone Encounter (Signed)
Completed forms found in RN box. Faxed to [facility] at [phone]. Placed in batch scanning. Danley Danker, RN [facility] RN)

## 2018-09-07 ENCOUNTER — Other Ambulatory Visit: Payer: Self-pay | Admitting: Family Medicine

## 2018-09-14 ENCOUNTER — Telehealth: Payer: Self-pay

## 2018-09-14 NOTE — Telephone Encounter (Signed)
April for [facility] called. She would like to speak to PCP about discontinuing some PRN meds off patient's med list that she has not been using.  Call back is [phone].  Danley Danker, RN [facility] RN)

## 2018-09-17 NOTE — Telephone Encounter (Signed)
Jean Williams has called again about this.  Call back is [phone]  Danley Danker, RN [facility] [place])

## 2018-09-24 NOTE — Telephone Encounter (Signed)
April has faxed over form to Dr. Pilar Plate to d/c robitussin and to change the dosage on another med.  When the form is faxed will be placed in MDs box. Christen Bame, CMA

## 2018-10-02 NOTE — Telephone Encounter (Signed)
Form signed and placed in fax pile.

## 2018-11-10 ENCOUNTER — Telehealth: Payer: Self-pay | Admitting: *Deleted

## 2018-11-10 ENCOUNTER — Encounter: Payer: Self-pay | Admitting: Family Medicine

## 2018-11-10 ENCOUNTER — Other Ambulatory Visit: Payer: Self-pay

## 2018-11-10 ENCOUNTER — Telehealth (INDEPENDENT_AMBULATORY_CARE_PROVIDER_SITE_OTHER): Payer: Medicare Other | Admitting: Family Medicine

## 2018-11-10 DIAGNOSIS — Z7189 Other specified counseling: Secondary | ICD-10-CM | POA: Diagnosis not present

## 2018-11-10 NOTE — Telephone Encounter (Signed)
Last pap (04/2017) was normal without any need for short term follow up. Pt's next pap is not needed until 04/2020. No need to follow up at this time.

## 2018-11-10 NOTE — Progress Notes (Signed)
Reason for visit: discuss fl2 and care plan Vitals performed at home: not performed Nurse Assessment:negative screening Depression screening in chart:negative Advanced Directives:none

## 2018-11-10 NOTE — Telephone Encounter (Signed)
Pt told April that she only needs a pap every two years. If this is true will need documentation faxed to them for her record. Christen Bame, CMA

## 2018-11-10 NOTE — Assessment & Plan Note (Signed)
Patient is a 58 year old female long-term resident at the long-term care facility. We discussed care plan and review medications list with patient and April one of the care giver at the facility. FL2 form and Care plane were faxed for review and signature. No changes in care plan. Of note patient is on Clozaril and is getting labs on a monthly basis. FL2 and care plan were signed and faxed back to the facility. Will follow as needed.

## 2018-11-10 NOTE — Progress Notes (Signed)
[place] Telemedicine Visit  Patient consented to have virtual visit. Method of visit: Telephone  Encounter participants: Patient: Jean Williams - located at [place]  Provider: Marjie Skiff - located at [facility] Others (if applicable): April, Care giver   Chief Complaint: Discuss care plan and signing FL2 form  HPI: Patient is 58 yo female with a past medical history significant for Schizophrenia currently residing in [facility] calling today to review Care Plan and signed a new copy of her FL2 form.  No acute complaints today.  Patient has been a resident at this facility for over 25 years.  April who is the caregiver at the facility was present during our conversation.  ROS: per HPI  Pertinent PMHx: Schizophrenia, panic attack  Exam:  Cardiac: No palpitation Respiratory: No increased work of breathing, no wheezing Abdominal: No abdominal pain Neuro: No focal deficit alert and oriented x4  Assessment/Plan:  Care plan discussed with patient Patient is a 58 year old female long-term resident at the long-term care facility. We discussed care plan and review medications list with patient and April one of the care giver at the facility. FL2 form and Care plane were faxed for review and signature. No changes in care plan. Of note patient is on Clozaril and is getting labs on a monthly basis. FL2 and care plan were signed and faxed back to the facility. Will follow as needed.    Time spent during visit with patient: 12 minutes

## 2018-11-11 NOTE — Telephone Encounter (Signed)
Letter created and faxed to April [phone].  Aleyah Balik,CMA

## 2018-11-29 ENCOUNTER — Encounter: Payer: Self-pay | Admitting: Gastroenterology

## 2018-12-09 ENCOUNTER — Telehealth: Payer: Self-pay | Admitting: *Deleted

## 2018-12-09 NOTE — Telephone Encounter (Signed)
Jean Williams, Good question. Any way we can speak to the long term care facility where she lives to see if they have had any cases? If no cases and she has no symptoms, may be okay to proceed at [facility]? Perhaps I can order COVID-19 test for her if needed. Her case is not until the end of June from what I can see so we have time to coordinate. Thanks

## 2018-12-09 NOTE — Telephone Encounter (Signed)
Milus Banister, MD  Laverna Peace, RN        Good question. I would recommend they be done at the hospital since they have testing workflows going now. Or could wait 4-5 weeks for the appointment and I think we'll have testing set up for [place] by then. Who is the doctor?   Previous Messages    ----- Message -----  From: Laverna Peace, RN  Sent: 12/09/2018 12:08 PM EDT  To: Milus Banister, MD  Subject: long term facility patients            Dr. Ardis Hughs,   I am working in previsit today. I am getting a chart ready for next week. This patient is from a long term care facility. Is it ok to schedule those patients to be done here? Just checking because of all the issues with covid 19.   Thanks,  Kristen      Dr. Havery Moros, How would you like to proceed with this patient's procedure?  Thanks, [employer]

## 2018-12-10 NOTE — Telephone Encounter (Signed)
Given this history, I think okay to proceed as is, but let me run this by Dr. Ardis Hughs given his prior comments.  Linna Hoff, please see message below, this patient is not coming from a long term care facility.

## 2018-12-10 NOTE — Telephone Encounter (Signed)
Dr.Armbruster,   I spoke with the patient's care taker. She states they have NOT had any cases of Covid-19 at the group home and they only have 6 patients at the home. It's not a long term care facility. Patient has not been out of the group home and she has no symptoms. Also the employees are follow guidelines and wearing masks. Ok to proceed? Do you want to order the Covid-19 testing? Please advise. Thank you, Amrutha Avera

## 2018-12-10 NOTE — Telephone Encounter (Signed)
Noted. [place] for [employer]. Care taker is aware of PV phone call for the patient.

## 2018-12-10 NOTE — Telephone Encounter (Signed)
Robbin, Thanks for the legwork on this.  Seems like it should be fine to do this at [facility].  Richardson Landry, See above.  Thanks  DJ

## 2018-12-16 ENCOUNTER — Other Ambulatory Visit: Payer: Self-pay

## 2018-12-16 ENCOUNTER — Ambulatory Visit (AMBULATORY_SURGERY_CENTER): Payer: Self-pay

## 2018-12-16 VITALS — Ht 66.0 in | Wt 170.0 lb

## 2018-12-16 DIAGNOSIS — Z86010 Personal history of colonic polyps: Secondary | ICD-10-CM

## 2018-12-16 MED ORDER — PEG 3350-KCL-NA BICARB-NACL 420 G PO SOLR: 4000.0000 mL | Freq: Once | ORAL | 0 refills | Status: AC

## 2018-12-16 NOTE — Progress Notes (Signed)
Denies allergies to eggs or soy products. Denies complication of anesthesia or sedation. Denies use of weight loss medication. Denies use of O2.   Emmi instructions given for colonoscopy.  Pre-Visit was conducted by phone due to Covid 19. Jean Williams is one of the patients care takers, and she states that the last colonoscopy Jean Williams left the group home and went out to dinner and that is probably why she was not cleaned out for her procedure.  Jean states that the patient has been on lock down since that time and it will be easier to help her with prep instructions. According to the care taker the patient does not always cooperate.  We did not have any samples of Suprep to give the patient and because she is on Medicare her prep was changed to Golytely and Dulcolax. Jean said that it would be much easier for them to monitor that prep. Instructions were mailed to Jean Williams the manager of the Group Home. I encouraged the care takers to call if they had any questions or concerns regarding the instructions.

## 2018-12-21 ENCOUNTER — Telehealth (INDEPENDENT_AMBULATORY_CARE_PROVIDER_SITE_OTHER): Payer: Medicare Other | Admitting: Family Medicine

## 2018-12-21 ENCOUNTER — Other Ambulatory Visit: Payer: Self-pay

## 2018-12-21 DIAGNOSIS — H6690 Otitis media, unspecified, unspecified ear: Secondary | ICD-10-CM | POA: Diagnosis not present

## 2018-12-21 DIAGNOSIS — H10029 Other mucopurulent conjunctivitis, unspecified eye: Secondary | ICD-10-CM | POA: Diagnosis present

## 2018-12-21 MED ORDER — POLYMYXIN B-TRIMETHOPRIM 10000-0.1 UNIT/ML-% OP SOLN: 2.0000 [drp] | Freq: Four times a day (QID) | OPHTHALMIC | 0 refills | Status: DC

## 2018-12-21 MED ORDER — AZITHROMYCIN 250 MG PO TABS: ORAL_TABLET | ORAL | 0 refills | Status: DC

## 2018-12-21 NOTE — Patient Instructions (Signed)
It was great meeting you today!  Your eye exam was consistent with conjunctivitis, also known as pinkeye.  You will take eyedrops for this every 6 hours, 2 drops in each eye, for the next 7 days.  Your history sounds consistent with an ear infection.  There is no way for me to know that doing a physical exam.  We will try a benign antibiotic called azithromycin to that helps.  We would normally do a penicillin but you unfortunately have an allergy to that medication.  Will take a Z-Pak, on day 1 take 2 tablets and on each subsequent day take 1 tablet to complete a 5-day course.

## 2018-12-23 ENCOUNTER — Telehealth: Payer: Self-pay | Admitting: *Deleted

## 2018-12-23 DIAGNOSIS — H10029 Other mucopurulent conjunctivitis, unspecified eye: Secondary | ICD-10-CM | POA: Insufficient documentation

## 2018-12-23 DIAGNOSIS — H6690 Otitis media, unspecified, unspecified ear: Secondary | ICD-10-CM | POA: Insufficient documentation

## 2018-12-23 MED ORDER — POLYMYXIN B-TRIMETHOPRIM 10000-0.1 UNIT/ML-% OP SOLN: 2.0000 [drp] | Freq: Four times a day (QID) | OPHTHALMIC | 0 refills | Status: AC

## 2018-12-23 NOTE — Telephone Encounter (Signed)
Pharmacy needs a length of need on the eye drops.  This can be called in or resent. Christen Bame, CMA

## 2018-12-23 NOTE — Assessment & Plan Note (Signed)
Mild difficulty tell without point physical exam patient has symptoms consistent with otitis media.  This is given the fullness in her ear, tenderness.  Very unlikely to be otitis externa given her age and the fact that she has not been exposed to water.  Reviewed allergy list and patient does have allergy to penicillin agents.  Prescribed azithromycin as alternative coverage. -Azithromycin 500 mg day 1, 250 mg days 2 through 5.

## 2018-12-23 NOTE — Telephone Encounter (Signed)
Had not closed visit yet, resent with 7 day length of need instructions within that visit.  Guadalupe Dawn MD PGY-2 Family Medicine Resident

## 2018-12-23 NOTE — Progress Notes (Signed)
[place] Telemedicine Visit  Patient consented to have virtual visit. Method of visit: Video  Encounter participants: Patient: Jean Williams - located at home Provider: Guadalupe Dawn - [facility] at [facility]  Chief Complaint: Pinkeye and ear pain  HPI: 58 year old Caucasian female presents as a video visit for pinkeye and ear pain.  She states that her eye became red on 12/20/2018.  Initially started as her left eye being somewhat swollen, "very red", and was stuck shut after waking up.  She states that the eye became more red, swollen, painful throughout the day.  She states that her right eye became similarly affected although much less starting this morning.  She still has good vision of the eye, is able to move all the muscles with no issue.  Patient reports a right sided ear pain.  She states this exquisitely painful.  She has not swam or exposed to water in any way.  Has not noticed any purulent material coming out of that.  States that she feels a "fullness in the ear".   ROS: per HPI  Pertinent PMHx: N/A  Exam:  General: Well-appearing 58 year old Caucasian female.  No acute distress HEENT: Mild to moderately swollen left eye, noted conjunctival injection.  Difficult to appreciate any eyelash clumping.  Right eye similarly affected all be it much less. Respiratory: Able speak in clear coherent sentences with no respiratory distress. Psych: Very pleasant, coherent thought process  Assessment/Plan:  Pink eye disease Patient with very notable conjunctival injection, periorbital swelling.  She has no vision problems, sensitivity to light, or any pain with range of ocular motion.  Treat her as a bacterial conjunctivitis both eyes.  Give trimethoprim/polymyxin B drops.  2 drops each eye every 6 hours for 7 days.  Acute otitis media Mild difficulty tell without point physical exam patient has symptoms consistent with otitis media.  This is given the fullness in  her ear, tenderness.  Very unlikely to be otitis externa given her age and the fact that she has not been exposed to water.  Reviewed allergy list and patient does have allergy to penicillin agents.  Prescribed azithromycin as alternative coverage. -Azithromycin 500 mg day 1, 250 mg days 2 through 5.    Time spent during visit with patient: 12 minutes

## 2018-12-23 NOTE — Assessment & Plan Note (Signed)
Patient with very notable conjunctival injection, periorbital swelling.  She has no vision problems, sensitivity to light, or any pain with range of ocular motion.  Treat her as a bacterial conjunctivitis both eyes.  Give trimethoprim/polymyxin B drops.  2 drops each eye every 6 hours for 7 days.

## 2018-12-27 ENCOUNTER — Ambulatory Visit (INDEPENDENT_AMBULATORY_CARE_PROVIDER_SITE_OTHER): Payer: Medicare Other | Admitting: Family Medicine

## 2018-12-27 ENCOUNTER — Other Ambulatory Visit: Payer: Self-pay

## 2018-12-27 ENCOUNTER — Encounter: Payer: Self-pay | Admitting: Family Medicine

## 2018-12-27 VITALS — BP 116/72 | HR 121

## 2018-12-27 DIAGNOSIS — H60503 Unspecified acute noninfective otitis externa, bilateral: Secondary | ICD-10-CM | POA: Insufficient documentation

## 2018-12-27 DIAGNOSIS — J302 Other seasonal allergic rhinitis: Secondary | ICD-10-CM | POA: Diagnosis present

## 2018-12-27 DIAGNOSIS — H6690 Otitis media, unspecified, unspecified ear: Secondary | ICD-10-CM

## 2018-12-27 DIAGNOSIS — H10029 Other mucopurulent conjunctivitis, unspecified eye: Secondary | ICD-10-CM

## 2018-12-27 MED ORDER — LORATADINE 10 MG PO TABS: 10.0000 mg | ORAL_TABLET | Freq: Every day | ORAL | 2 refills | Status: DC

## 2018-12-27 MED ORDER — NEOMYCIN-POLYMYXIN-HC 3.5-10000-1 OT SOLN: 3.0000 [drp] | Freq: Four times a day (QID) | OTIC | 0 refills | Status: DC

## 2018-12-27 NOTE — Assessment & Plan Note (Signed)
Conjunctival injection today significantly improved with no periorbital swelling.  No vision change.  Based on my exam and patient's presentation was convinced redness in patient's eyes are secondary to pinkeye.  Symptoms are most consistent with allergies patient receiving Claritin but has not used in over a year.  She does describe itchy eyes and congestion which would be consistent with allergic rhinitis.  The pressure likely secondary to congestion. --We will stop eyedrops and start patient on Claritin in addition to Flonase daily --Follow-up as needed

## 2018-12-27 NOTE — Progress Notes (Signed)
Subjective:    Patient ID: Jean Williams, female    DOB: 03/04/1961, 57 y.o.   MRN: [id number]   CC: Follow-up for pinkeye  HPI: Patient is a 58 year old female who presents today to follow-up on recent treatment for pinkeye and otitis media.  Patient had a telemedicine visit on 6/18 during which she was diagnosed with otitis media and was prescribed with azithromycin as well as pinkeye for which she was prescribed trimethoprim polymyxin eardrops.  Patient lives in a group home and was sent again here today for continued symptoms.  She reports that the back that is her eyes are still red despite eyedrops.  Patient reports bilateral ear pressure and congestion but denies any pain, fever, nausea, vomiting or chills.  Patient does not have any pain in her eye but but reports watery eyes.  She currently takes Flonase.  Patient denies any sick contacts.  Smoking status reviewed   ROS: all other systems were reviewed and are negative other than in the HPI   Past Medical History:  Diagnosis Date  . Allergy   . Anxiety   . Asthma    per pt, doesn"t have asthma  . 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 Basal cell carcinoma, lip 05/13/2011   Left upper lip. Treated with Mohs micrographic surgery by Dr. Tonita Cong MD. On 08/22/11.    . Emphysema of lung ([place])   . GERD (gastroesophageal reflux disease)   . Hyperlipidemia   . Panic attacks   . 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 Benign per Derm. Dr. Lajean Saver.    . Schizophrenia ([place])   . Smoker   . Tachycardia     Past Surgical History:  Procedure Laterality Date  . COLONOSCOPY    . gyn surgery     for pre cancerous cells  . skin cancer removal     from lip    Past medical history, surgical, family, and social history reviewed and updated in the EMR as appropriate.  Objective:  BP 116/72   Pulse (!) 121   LMP 06/10/2011   SpO2 93%   Vitals  and nursing note reviewed  General: NAD, pleasant, able to participate in exam HEENT: Bilateral eye exam, noninjected conjunctiva, watery eyes bilaterally.  Bilateral TM nonbulging nonerythematous with no fluid level.  External canal with filamentous tissue, mild erythema.  No drainage.  Mild maxillary sinus tenderness with pressure. Cardiac: RRR, normal heart sounds, no murmurs. 2+ radial and PT pulses bilaterally Respiratory: CTAB, normal effort, No wheezes, rales or rhonchi Abdomen: soft, nontender, nondistended, no hepatic or splenomegaly, +BS Extremities: no edema or cyanosis. WWP. Skin: warm and dry, no rashes noted Neuro: alert and oriented x4, no focal deficits Psych: Normal affect and mood   Assessment & Plan:   Pink eye disease Conjunctival injection today significantly improved with no periorbital swelling.  No vision change.  Based on my exam and patient's presentation was convinced redness in patient's eyes are secondary to pinkeye.  Symptoms are most consistent with allergies patient receiving Claritin but has not used in over a year.  She does describe itchy eyes and congestion which would be consistent with allergic rhinitis.  The pressure likely secondary to congestion. --We will stop eyedrops and start patient on Claritin in addition to Flonase daily --Follow-up as needed  Acute otitis media Ear exam not consistent with otitis media.  Pressure reported bilaterally likely  secondary to sinus congestion in the setting of allergic rhinitis.  Continue Flonase will resume Claritin.  There is a finding most consistent with otitis externa.  Acute noninfective otitis externa of both ears Extraocular eye exam erythematous with filamentous tissue concerning for acute otitis externa given tingling and discomfort described by patient.  Will prescribe Corticosporin otic solution to be used for the next few days until symptoms resolve.  Seasonal allergic rhinitis Continue Flonase daily  will resume Claritin.  Watery eyes redness likely secondary to allergies.  Bilateral ear pressure likely secondary to congestion.  Follow-up with PCP as needed    Marjie Skiff, MD [facility] PGY-3

## 2018-12-27 NOTE — Assessment & Plan Note (Signed)
Extraocular eye exam erythematous with filamentous tissue concerning for acute otitis externa given tingling and discomfort described by patient.  Will prescribe Corticosporin otic solution to be used for the next few days until symptoms resolve.

## 2018-12-27 NOTE — Assessment & Plan Note (Signed)
Ear exam not consistent with otitis media.  Pressure reported bilaterally likely secondary to sinus congestion in the setting of allergic rhinitis.  Continue Flonase will resume Claritin.  There is a finding most consistent with otitis externa.

## 2018-12-27 NOTE — Assessment & Plan Note (Signed)
Continue Flonase daily will resume Claritin.  Watery eyes redness likely secondary to allergies.  Bilateral ear pressure likely secondary to congestion.  Follow-up with PCP as needed

## 2018-12-29 ENCOUNTER — Telehealth: Payer: Self-pay | Admitting: Gastroenterology

## 2018-12-29 NOTE — Telephone Encounter (Signed)
°  Covid-19 Screening Questions: Do you now or have you had a fever in the last 14 days? no  Do you have any respiratory symptoms of shortness of breath or cough now or in the last 14 days? no  Do you have any family members or close contacts with diagnosed or suspected Covid-19 in the past 14 days? no  Have you been tested for Covid-19 and found to be positive? no  Pt made aware of that care partner may wait in the car or come up to the lobby during the procedure but will need to provide their own mask.

## 2018-12-30 ENCOUNTER — Ambulatory Visit (AMBULATORY_SURGERY_CENTER): Payer: Medicare Other | Admitting: Gastroenterology

## 2018-12-30 ENCOUNTER — Other Ambulatory Visit: Payer: Self-pay

## 2018-12-30 ENCOUNTER — Encounter: Payer: Self-pay | Admitting: Gastroenterology

## 2018-12-30 VITALS — BP 130/85 | HR 88 | Temp 99.0°F | Resp 19 | Ht 66.0 in | Wt 170.0 lb

## 2018-12-30 DIAGNOSIS — K621 Rectal polyp: Secondary | ICD-10-CM | POA: Diagnosis not present

## 2018-12-30 DIAGNOSIS — D123 Benign neoplasm of transverse colon: Secondary | ICD-10-CM | POA: Diagnosis not present

## 2018-12-30 DIAGNOSIS — Z86010 Personal history of colonic polyps: Secondary | ICD-10-CM

## 2018-12-30 DIAGNOSIS — D128 Benign neoplasm of rectum: Secondary | ICD-10-CM

## 2018-12-30 DIAGNOSIS — D129 Benign neoplasm of anus and anal canal: Secondary | ICD-10-CM

## 2018-12-30 DIAGNOSIS — K6289 Other specified diseases of anus and rectum: Secondary | ICD-10-CM

## 2018-12-30 MED ORDER — SODIUM CHLORIDE 0.9 % IV SOLN: 500.0000 mL | Freq: Once | INTRAVENOUS | Status: DC

## 2018-12-30 NOTE — Progress Notes (Signed)
Pt's states no medical or surgical changes since previsit or office visit. 

## 2018-12-30 NOTE — Progress Notes (Signed)
Called to room to assist during endoscopic procedure.  Patient ID and intended procedure confirmed with present staff. Received instructions for my participation in the procedure from the performing physician.  

## 2018-12-30 NOTE — Progress Notes (Signed)
Report to PACU, RN, vss, BBS= Clear.  

## 2018-12-30 NOTE — Progress Notes (Signed)
[place], [place]- vitals

## 2018-12-30 NOTE — Patient Instructions (Signed)
Thank you for allowing [place] to care for you today!  Await pathology results by mail, approximately 2 weeks.  Will make recommendation for next colonoscopy at that time.  Resume previous diet and medications today.  Return to your normal activities tomorrow.   YOU HAD AN ENDOSCOPIC PROCEDURE TODAY AT [facility]:   Refer to the procedure report that was given to you for any specific questions about what was found during the examination.  If the procedure report does not answer your questions, please call your gastroenterologist to clarify.  If you requested that your care partner not be given the details of your procedure findings, then the procedure report has been included in a sealed envelope for you to review at your convenience later.  YOU SHOULD EXPECT: Some feelings of bloating in the abdomen. Passage of more gas than usual.  Walking can help get rid of the air that was put into your GI tract during the procedure and reduce the bloating. If you had a lower endoscopy (such as a colonoscopy or flexible sigmoidoscopy) you may notice spotting of blood in your stool or on the toilet paper. If you underwent a bowel prep for your procedure, you may not have a normal bowel movement for a few days.  Please Note:  You might notice some irritation and congestion in your nose or some drainage.  This is from the oxygen used during your procedure.  There is no need for concern and it should clear up in a day or so.  SYMPTOMS TO REPORT IMMEDIATELY:   Following lower endoscopy (colonoscopy or flexible sigmoidoscopy):  Excessive amounts of blood in the stool  Significant tenderness or worsening of abdominal pains  Swelling of the abdomen that is new, acute  Fever of 100F or higher  For urgent or emergent issues, a gastroenterologist can be reached at any hour by calling [phone].   DIET:  We do recommend a small meal at first, but then you may proceed to your regular diet.  Drink  plenty of fluids but you should avoid alcoholic beverages for 24 hours.  ACTIVITY:  You should plan to take it easy for the rest of today and you should NOT DRIVE or use heavy machinery until tomorrow (because of the sedation medicines used during the test).    FOLLOW UP: Our staff will call the number listed on your records 48-72 hours following your procedure to check on you and address any questions or concerns that you may have regarding the information given to you following your procedure. If we do not reach you, we will leave a message.  We will attempt to reach you two times.  During this call, we will ask if you have developed any symptoms of COVID 19. If you develop any symptoms (ie: fever, flu-like symptoms, shortness of breath, cough etc.) before then, please call [phone].  If you test positive for Covid 19 in the 2 weeks post procedure, please call and report this information to [place].    If any biopsies were taken you will be contacted by phone or by letter within the next 1-3 weeks.  Please call us at [phone] if you have not heard about the biopsies in 3 weeks.    SIGNATURES/CONFIDENTIALITY: You and/or your care partner have signed paperwork which will be entered into your electronic medical record.  These signatures attest to the fact that that the information above on your After Visit Summary has been reviewed and is  understood.  Full responsibility of the confidentiality of this discharge information lies with you and/or your care-partner. 

## 2018-12-30 NOTE — Op Note (Addendum)
[facility] Patient Name: Jean Williams Procedure Date: 12/30/2018 9:57 AM MRN: [id number] Endoscopist: Remo Lipps P. Havery Moros , MD Age: 58 Referring MD:  Date of Birth: Nov 22, 1960 Gender: Female Account #: [id number] Procedure:                Colonoscopy Indications:              High risk colon cancer surveillance: Personal                            history of colonic polyps - last 2 colonoscopies                            limited by poor prep Medicines:                Monitored Anesthesia Care Procedure:                Pre-Anesthesia Assessment:                           - Prior to the procedure, a History and Physical                            was performed, and patient medications and                            allergies were reviewed. The patient's tolerance of                            previous anesthesia was also reviewed. The risks                            and benefits of the procedure and the sedation                            options and risks were discussed with the patient.                            All questions were answered, and informed consent                            was obtained. Prior Anticoagulants: The patient has                            taken no previous anticoagulant or antiplatelet                            agents. ASA Grade Assessment: II - A patient with                            mild systemic disease. After reviewing the risks                            and benefits, the patient was deemed in  satisfactory condition to undergo the procedure.                           After obtaining informed consent, the colonoscope                            was passed under direct vision. Throughout the                            procedure, the patient's blood pressure, pulse, and                            oxygen saturations were monitored continuously. The                            Model PCF-[id number]) scope  was introduced                            through the anus and advanced to the the cecum,                            identified by appendiceal orifice and ileocecal                            valve. The colonoscopy was technically difficult                            and complex due to a redundant colon. The patient                            tolerated the procedure well. The quality of the                            bowel preparation was adequate. The ileocecal                            valve, appendiceal orifice, and rectum were                            photographed. Scope In: 10:09:44 AM Scope Out: 10:39:13 AM Scope Withdrawal Time: 0 hours 22 minutes 31 seconds  Total Procedure Duration: 0 hours 29 minutes 29 seconds  Findings:                 The perianal and digital rectal examinations were                            normal.                           Two sessile polyps were found in the transverse                            colon. The polyps were 3 to 4 mm in size. These  polyps were removed with a cold snare. Resection                            and retrieval were complete.                           Two sessile polyps were found in the rectum. The                            polyps were 3 mm in size. These polyps were removed                            with a cold snare. Resection and retrieval were                            complete.                           A polypoid lesion was found in the distal rectum /                            anal canal, approximating the dentate line, in this                            light I did not attempt to remove it. The lesion                            was sessile. There was an isolated ulcer lateral to                            this lesion. The lesion was not easily seen in                            retroflexed views, fold proximal to it had to be                            pleated down. Biopsies were taken with  a cold                            forceps for histology. On review of photos from                            last colonoscopy, this lesion was not present on                            previous retroflexed views.                           The sigmoid colon was significantly redundant with                            looping which prolonged cecal intubation.  There was a small lipoma, at the hepatic flexure.                           The exam was otherwise without abnormality. Complications:            No immediate complications. Estimated blood loss:                            Minimal. Estimated Blood Loss:     Estimated blood loss was minimal. Impression:               - Two 3 to 4 mm polyps in the transverse colon,                            removed with a cold snare. Resected and retrieved.                           - Two 3 mm polyps in the rectum, removed with a                            cold snare. Resected and retrieved.                           - Polypoid lesion in the anal canal approximating                            the dentate line. Biopsied. Rectal ulceration noted                            in close proximity                           - Redundant colon.                           - Small lipoma at the hepatic flexure.                           - The examination was otherwise normal. Recommendation:           - Patient has a contact number available for                            emergencies. The signs and symptoms of potential                            delayed complications were discussed with the                            patient. Return to normal activities tomorrow.                            Written discharge instructions were provided to the  patient.                           - Resume previous diet.                           - Continue present medications.                           - Await pathology results. Anal  canal lesion will                            warrant surgical evaluation pending pathology                            results. Remo Lipps P. Havery Moros, MD 12/30/2018 10:46:51 AM This report has been signed electronically.

## 2019-01-03 ENCOUNTER — Other Ambulatory Visit: Payer: Self-pay | Admitting: Family Medicine

## 2019-01-03 ENCOUNTER — Telehealth: Payer: Self-pay

## 2019-01-03 DIAGNOSIS — H60503 Unspecified acute noninfective otitis externa, bilateral: Secondary | ICD-10-CM

## 2019-01-03 NOTE — Telephone Encounter (Signed)
  Follow up Call-  Call back number 12/30/2018 11/16/2017 09/17/2017  Post procedure Call Back phone  # [phone] staff will answer phone. ok to talk to them. [phone]  Permission to leave phone message Yes Yes Yes  Some recent data might be hidden     Patient questions:  Do you have a fever, pain , or abdominal swelling? No. Pain Score  0 *  Have you tolerated food without any problems? Yes.    Have you been able to return to your normal activities? Yes.    Do you have any questions about your discharge instructions: Diet   No. Medications  No. Follow up visit  No.  Do you have questions or concerns about your Care? No.  Actions: * If pain score is 4 or above: No action needed, pain <4.   1. Have you developed a fever since your procedure? no  2.   Have you had an respiratory symptoms (SOB or cough) since your procedure? no  3.   Have you tested positive for COVID 19 since your procedure no  4.   Have you had any family members/close contacts diagnosed with the COVID 19 since your procedure?  no   If yes to any of these questions please route to Joylene John, RN and Alphonsa Gin, Therapist, sports.

## 2019-01-31 ENCOUNTER — Other Ambulatory Visit: Payer: Self-pay | Admitting: Family Medicine

## 2019-01-31 ENCOUNTER — Telehealth: Payer: Self-pay | Admitting: Gastroenterology

## 2019-01-31 DIAGNOSIS — H60503 Unspecified acute noninfective otitis externa, bilateral: Secondary | ICD-10-CM

## 2019-01-31 NOTE — Telephone Encounter (Signed)
Jean Williams with [facility] called. She called pt and  Scheduled her appt with Dr. Marcello Williams on 02-08-2019 at 9:10am.  Pt requested that she call back in the morning because she is in a facility and she wants her to speak with someone there regarding the timing of the appt.

## 2019-01-31 NOTE — Telephone Encounter (Signed)
Pt reported that she was expecting a referral to [facility], but she has not heard from them.

## 2019-01-31 NOTE — Telephone Encounter (Signed)
Called [facility] and left message for Judson Roch, Referral Coordinator, to call back with information regarding scheduling pt for appt.  Gave her the additional phone number patient called in on:  [phone]. Her chart only has a "temporary number" listed.

## 2019-02-03 ENCOUNTER — Other Ambulatory Visit: Payer: Self-pay

## 2019-02-03 ENCOUNTER — Telehealth (INDEPENDENT_AMBULATORY_CARE_PROVIDER_SITE_OTHER): Payer: Medicare Other | Admitting: Family Medicine

## 2019-02-03 DIAGNOSIS — H60503 Unspecified acute noninfective otitis externa, bilateral: Secondary | ICD-10-CM | POA: Diagnosis not present

## 2019-02-03 DIAGNOSIS — Z760 Encounter for issue of repeat prescription: Secondary | ICD-10-CM

## 2019-02-03 NOTE — Assessment & Plan Note (Signed)
Patient has completed course.  Symptoms have resolved.  Will fax letter to discontinue medications to fax number provided by caretaker.  Strict return precautions given.  Follow-up as needed

## 2019-02-03 NOTE — Progress Notes (Signed)
[facility] Telemedicine Visit I connected with  Jean Williams on 02/03/19 by a video enabled telemedicine application and verified that I am speaking with the correct person using two identifiers.   I discussed the limitations of evaluation and management by telemedicine. The patient expressed understanding and agreed to proceed.  Patient consented to have virtual visit. Method of visit: Video  Encounter participants: Patient: Jean Williams - located at home Provider: Caroline More - located at [facility] Others (if applicable): Jean Williams, caretaker   Chief Complaint: need for discontinuation of med letter   HPI: Letter  Patient presenting with caretaker to request letter for discontinuation of medication.  Was seen by Dr. Ether Griffins on 12/27/2018 and diagnosed with otitis externa.  Was prescribed Cortisporin otic drops and has completed course.  Per caretaker patient's facility requires a letter of discontinuation in order to stop these medications.  Patient reports ears are draining and itching. Patient was placed on cortisporin otic which cleared it up.  Has finished the drops but caretaker reports that without a letter of discontinuation facility must continue to request refills and administer medications.  Requested to fax discontinuation letter to [phone]  ROS: per HPI  Pertinent PMHx: otitis externa, schizophrenia   Exam:  Gen: awake and alert, NAD  Respiratory: speaking full sentences, NAD, no respiratory distress  Assessment/Plan:  Acute noninfective otitis externa of both ears Patient has completed course.  Symptoms have resolved.  Will fax letter to discontinue medications to fax number provided by caretaker.  Strict return precautions given.  Follow-up as needed    Time spent during visit with patient: 15 minutes

## 2019-02-21 ENCOUNTER — Ambulatory Visit: Payer: Self-pay | Admitting: General Surgery

## 2019-02-21 NOTE — H&P (Signed)
History of Present Illness Jean Ruff MD; 9/98/3382 4:01 PM) The patient is a 58 year old female who presents with anal lesions. 58 year old female who underwent a colonoscopy. She was noted to have a distal rectal polyp at the dentate line. Biopsies show AIN grade 1. She is here for further evaluation. She is asymptomatic.   Past Surgical History [name], [name]; 11/09/3974 7:34 PM) Colon Polyp Removal - Colonoscopy  Diagnostic Studies History [name], [name]; 1/93/7902 4:09 PM) Colonoscopy within last year Mammogram within last year Pap Smear 1-5 years ago  Allergies [name], [name]; 7/35/3299 2:42 PM) Penicillins Rash, Hives.  Medication History [name], [name]; 6/83/4196 2:22 PM) Tylenol (325MG  Tablet, Oral) Active. Ventolin HFA (108 (90 Base)MCG/ACT Aerosol Soln, Inhalation) Active. Triamcinolone Acetonide (0.025% Cream, External) Active. Flonase (50MCG/DOSE Inhaler, Nasal) Active. Claritin (10MG  Capsule, Oral) Active. Paxil (30MG  Tablet, Oral) Active. Certa Plus (Oral) Active. MiraLax (Oral) Active. Colace (100MG  Capsule, Oral) Active. Flovent HFA (110MCG/ACT Aerosol, Inhalation) Active. Pepcid (40MG  Tablet, Oral) Active. Calcium 600/Vitamin D (600-400MG -UNIT Tablet, Oral) Active. Clozaril (100MG  Tablet, Oral) Active. Refresh (1% Solution, Ophthalmic) Active. Lipitor (40MG  Tablet, Oral) Active. Eucerin (External) Active. Neomycin-Polymyxin B GU (40-200000 Solution, Irrigation) Active. Medications Reconciled  Social History [name], [name]; 9/79/8921 1:94 PM) Caffeine use Carbonated beverages. No alcohol use No drug use Tobacco use Current every day smoker.  Family History [name], [name]; 1/74/0814 4:81 PM) Breast Cancer Sister. Hypertension Father.  Pregnancy / Birth History [name], [name]; 8/56/3149 7:02 PM) Age at menarche 54 years. Age of menopause <44 Gravida 1 Irregular  periods Maternal age 4-30 Para 1  Other Problems [name], [name]; 6/37/8588 5:02 PM) Anxiety Disorder Asthma Hypercholesterolemia     Review of Systems [name] [name]; 7/74/1287 8:67 PM) General Not Present- Appetite Loss, Chills, Fatigue, Fever, Night Sweats, Weight Gain and Weight Loss. Skin Not Present- Change in Wart/Mole, Dryness, Hives, Jaundice, New Lesions, Non-Healing Wounds, Rash and Ulcer. HEENT Not Present- Earache, Hearing Loss, Hoarseness, Nose Bleed, Oral Ulcers, Ringing in the Ears, Seasonal Allergies, Sinus Pain, Sore Throat, Visual Disturbances, Wears glasses/contact lenses and Yellow Eyes. Respiratory Not Present- Bloody sputum, Chronic Cough, Difficulty Breathing, Snoring and Wheezing. Cardiovascular Not Present- Chest Pain, Difficulty Breathing Lying Down, Leg Cramps, Palpitations, Rapid Heart Rate, Shortness of Breath and Swelling of Extremities. Gastrointestinal Not Present- Abdominal Pain, Bloating, Bloody Stool, Change in Bowel Habits, Chronic diarrhea, Constipation, Difficulty Swallowing, Excessive gas, Gets full quickly at meals, Hemorrhoids, Indigestion, Nausea, Rectal Pain and Vomiting. Female Genitourinary Not Present- Frequency, Nocturia, Painful Urination, Pelvic Pain and Urgency. Musculoskeletal Not Present- Back Pain, Joint Pain, Joint Stiffness, Muscle Pain, Muscle Weakness and Swelling of Extremities. Neurological Not Present- Decreased Memory, Fainting, Headaches, Numbness, Seizures, Tingling, Tremor, Trouble walking and Weakness. Psychiatric Present- Anxiety, Depression and Fearful. Not Present- Bipolar, Change in Sleep Pattern and Frequent crying. Endocrine Not Present- Cold Intolerance, Excessive Hunger, Hair Changes, Heat Intolerance, Hot flashes and New Diabetes. Hematology Not Present- Blood Thinners, Easy Bruising, Excessive bleeding, Gland problems, HIV and Persistent Infections.  Vitals [name]; 6/72/0947 0:96  PM) 02/21/2019 3:47 PM Weight: 171.4 lb Height: 66in Body Surface Area: 1.87 m Body Mass Index: 27.66 kg/m  Temp.: 97.63F(Temporal)  Pulse: 120 (Regular)  BP: 108/72 (Sitting, Right Arm, Standard)        Physical Exam Jean Ruff MD; 2/83/6629 4:00 PM)  General Mental Status-Alert. General Appearance-Not in acute distress. Build & Nutrition-Well nourished. Posture-Normal posture. Gait-Normal.  Head and Neck Head-normocephalic, atraumatic with no lesions or  palpable masses. Trachea-midline.  Chest and Lung Exam Chest and lung exam reveals -on auscultation, normal breath sounds, no adventitious sounds and normal vocal resonance.  Cardiovascular Cardiovascular examination reveals -normal heart sounds, regular rate and rhythm with no murmurs and no digital clubbing, cyanosis, edema, increased warmth or tenderness.  Abdomen Inspection Inspection of the abdomen reveals - No Hernias. Palpation/Percussion Palpation and Percussion of the abdomen reveal - Soft, Non Tender, No Rigidity (guarding), No hepatosplenomegaly and No Palpable abdominal masses.  Neurologic Neurologic evaluation reveals -alert and oriented x 3 with no impairment of recent or remote memory, normal attention span and ability to concentrate, normal sensation and normal coordination.  Musculoskeletal Normal Exam - Bilateral-Upper Extremity Strength Normal and Lower Extremity Strength Normal.    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 Biopsies concerning for AIN. I have recommended transanal excision. We have discussed risk of surgery, which are bleeding, pain and recurrence. We have discussed that AIN can progress to cancer in some cases if left untreated. All questions were answered. The patient decided to proceed with transanal excision.

## 2019-02-21 NOTE — H&P (View-Only) (Signed)
History of Present Illness Jean Ruff MD; 1/61/0960 4:01 PM) The patient is a 58 year old female who presents with anal lesions. 58 year old female who underwent a colonoscopy. She was noted to have a distal rectal polyp at the dentate line. Biopsies show AIN grade 1. She is here for further evaluation. She is asymptomatic.   Past Surgical History [name], [name]; 4/54/0981 1:91 PM) Colon Polyp Removal - Colonoscopy  Diagnostic Studies History [name], [name]; 4/78/2956 2:13 PM) Colonoscopy within last year Mammogram within last year Pap Smear 1-5 years ago  Allergies [name], [name]; 0/86/5784 6:96 PM) Penicillins Rash, Hives.  Medication History [name], [name]; 2/95/2841 3:24 PM) Tylenol (325MG  Tablet, Oral) Active. Ventolin HFA (108 (90 Base)MCG/ACT Aerosol Soln, Inhalation) Active. Triamcinolone Acetonide (0.025% Cream, External) Active. Flonase (50MCG/DOSE Inhaler, Nasal) Active. Claritin (10MG  Capsule, Oral) Active. Paxil (30MG  Tablet, Oral) Active. Certa Plus (Oral) Active. MiraLax (Oral) Active. Colace (100MG  Capsule, Oral) Active. Flovent HFA (110MCG/ACT Aerosol, Inhalation) Active. Pepcid (40MG  Tablet, Oral) Active. Calcium 600/Vitamin D (600-400MG -UNIT Tablet, Oral) Active. Clozaril (100MG  Tablet, Oral) Active. Refresh (1% Solution, Ophthalmic) Active. Lipitor (40MG  Tablet, Oral) Active. Eucerin (External) Active. Neomycin-Polymyxin B GU (40-200000 Solution, Irrigation) Active. Medications Reconciled  Social History [name], [name]; 10/06/270 5:36 PM) Caffeine use Carbonated beverages. No alcohol use No drug use Tobacco use Current every day smoker.  Family History [name], [name]; 6/44/0347 4:25 PM) Breast Cancer Sister. Hypertension Father.  Pregnancy / Birth History [name], [name]; 9/56/3875 6:43 PM) Age at menarche 67 years. Age of menopause <44 Gravida 1 Irregular  periods Maternal age 59-30 Para 1  Other Problems [name], [name]; 10/03/5186 4:16 PM) Anxiety Disorder Asthma Hypercholesterolemia     Review of Systems [name] [name]; 12/11/3014 0:10 PM) General Not Present- Appetite Loss, Chills, Fatigue, Fever, Night Sweats, Weight Gain and Weight Loss. Skin Not Present- Change in Wart/Mole, Dryness, Hives, Jaundice, New Lesions, Non-Healing Wounds, Rash and Ulcer. HEENT Not Present- Earache, Hearing Loss, Hoarseness, Nose Bleed, Oral Ulcers, Ringing in the Ears, Seasonal Allergies, Sinus Pain, Sore Throat, Visual Disturbances, Wears glasses/contact lenses and Yellow Eyes. Respiratory Not Present- Bloody sputum, Chronic Cough, Difficulty Breathing, Snoring and Wheezing. Cardiovascular Not Present- Chest Pain, Difficulty Breathing Lying Down, Leg Cramps, Palpitations, Rapid Heart Rate, Shortness of Breath and Swelling of Extremities. Gastrointestinal Not Present- Abdominal Pain, Bloating, Bloody Stool, Change in Bowel Habits, Chronic diarrhea, Constipation, Difficulty Swallowing, Excessive gas, Gets full quickly at meals, Hemorrhoids, Indigestion, Nausea, Rectal Pain and Vomiting. Female Genitourinary Not Present- Frequency, Nocturia, Painful Urination, Pelvic Pain and Urgency. Musculoskeletal Not Present- Back Pain, Joint Pain, Joint Stiffness, Muscle Pain, Muscle Weakness and Swelling of Extremities. Neurological Not Present- Decreased Memory, Fainting, Headaches, Numbness, Seizures, Tingling, Tremor, Trouble walking and Weakness. Psychiatric Present- Anxiety, Depression and Fearful. Not Present- Bipolar, Change in Sleep Pattern and Frequent crying. Endocrine Not Present- Cold Intolerance, Excessive Hunger, Hair Changes, Heat Intolerance, Hot flashes and New Diabetes. Hematology Not Present- Blood Thinners, Easy Bruising, Excessive bleeding, Gland problems, HIV and Persistent Infections.  Vitals [name]; 9/32/3557 3:22  PM) 02/21/2019 3:47 PM Weight: 171.4 lb Height: 66in Body Surface Area: 1.87 m Body Mass Index: 27.66 kg/m  Temp.: 97.52F(Temporal)  Pulse: 120 (Regular)  BP: 108/72 (Sitting, Right Arm, Standard)        Physical Exam Jean Ruff MD; 0/25/4270 4:00 PM)  General Mental Status-Alert. General Appearance-Not in acute distress. Build & Nutrition-Well nourished. Posture-Normal posture. Gait-Normal.  Head and Neck Head-normocephalic, atraumatic with no lesions or  palpable masses. Trachea-midline.  Chest and Lung Exam Chest and lung exam reveals -on auscultation, normal breath sounds, no adventitious sounds and normal vocal resonance.  Cardiovascular Cardiovascular examination reveals -normal heart sounds, regular rate and rhythm with no murmurs and no digital clubbing, cyanosis, edema, increased warmth or tenderness.  Abdomen Inspection Inspection of the abdomen reveals - No Hernias. Palpation/Percussion Palpation and Percussion of the abdomen reveal - Soft, Non Tender, No Rigidity (guarding), No hepatosplenomegaly and No Palpable abdominal masses.  Neurologic Neurologic evaluation reveals -alert and oriented x 3 with no impairment of recent or remote memory, normal attention span and ability to concentrate, normal sensation and normal coordination.  Musculoskeletal Normal Exam - Bilateral-Upper Extremity Strength Normal and Lower Extremity Strength Normal.    Assessment & Plan Jean Ruff MD; 11/14/209 4:02 PM)  AIN GRADE I (Z73.56) Impression: 58 year old female with a distal rectal polyp. Biopsies concerning for AIN. I have recommended transanal excision. We have discussed risk of surgery, which are bleeding, pain and recurrence. We have discussed that AIN can progress to cancer in some cases if left untreated. All questions were answered. The patient decided to proceed with transanal excision.

## 2019-03-15 ENCOUNTER — Inpatient Hospital Stay (HOSPITAL_COMMUNITY)
Admission: RE | Admit: 2019-03-15 | Discharge: 2019-03-15 | Disposition: A | Discharge status: Home / Self Care | Payer: Medicare Other | Source: Ambulatory Visit

## 2019-03-15 NOTE — Progress Notes (Signed)
Patient will need a RAPID COVID test the day of surgery, due to living on congregated living, patient is aware of this

## 2019-03-16 ENCOUNTER — Encounter (HOSPITAL_BASED_OUTPATIENT_CLINIC_OR_DEPARTMENT_OTHER): Payer: Self-pay | Admitting: *Deleted

## 2019-03-16 ENCOUNTER — Other Ambulatory Visit: Payer: Self-pay

## 2019-03-16 NOTE — Progress Notes (Addendum)
Spoke w/ pt's caregiver, April, with pt with her via phone for pre-op interview.  April verbalized understanding for pt to be Npo after mn w/ exception clear liquids until 0700 then nothing by mouth.  Will take colace, paxil, clozapine, claritin, pepcid, and do flovent inhaler w/ sips of water and bring rescue inhaler.   Since resides in a congregated living setting , pt getting rapid covid test done dos , advised to at testing site absolutely no later than 0900, before 0900 is preferred.  April verbalized understanding of this and that she will get a call from [facility] with result for bringing pt to [facility].  Arrive at 1100 or when after receives results.   Name of facility pt resides is [facility]. Caregiver, April Thompson, whom will be ride/ caregiver at discharge, cell # [phone].

## 2019-03-17 ENCOUNTER — Ambulatory Visit (HOSPITAL_BASED_OUTPATIENT_CLINIC_OR_DEPARTMENT_OTHER): Payer: Medicare Other | Admitting: Certified Registered"

## 2019-03-17 ENCOUNTER — Ambulatory Visit (HOSPITAL_BASED_OUTPATIENT_CLINIC_OR_DEPARTMENT_OTHER)
Admission: RE | Admit: 2019-03-17 | Discharge: 2019-03-17 | Disposition: A | Discharge status: Home / Self Care | Payer: Medicare Other | Attending: General Surgery | Admitting: General Surgery

## 2019-03-17 ENCOUNTER — Encounter (HOSPITAL_BASED_OUTPATIENT_CLINIC_OR_DEPARTMENT_OTHER): Payer: Self-pay | Admitting: Certified Registered"

## 2019-03-17 ENCOUNTER — Encounter (HOSPITAL_BASED_OUTPATIENT_CLINIC_OR_DEPARTMENT_OTHER)
Admission: RE | Disposition: A | Discharge status: Home / Self Care | Payer: Self-pay | Source: Home / Self Care | Attending: General Surgery

## 2019-03-17 ENCOUNTER — Other Ambulatory Visit (HOSPITAL_COMMUNITY)
Admission: RE | Admit: 2019-03-17 | Discharge: 2019-03-17 | Disposition: A | Discharge status: Home / Self Care | Payer: Medicare Other | Source: Ambulatory Visit | Attending: General Surgery | Admitting: General Surgery

## 2019-03-17 DIAGNOSIS — F17200 Nicotine dependence, unspecified, uncomplicated: Secondary | ICD-10-CM | POA: Insufficient documentation

## 2019-03-17 DIAGNOSIS — Z20828 Contact with and (suspected) exposure to other viral communicable diseases: Secondary | ICD-10-CM | POA: Insufficient documentation

## 2019-03-17 DIAGNOSIS — K649 Unspecified hemorrhoids: Secondary | ICD-10-CM | POA: Insufficient documentation

## 2019-03-17 DIAGNOSIS — Z803 Family history of malignant neoplasm of breast: Secondary | ICD-10-CM | POA: Insufficient documentation

## 2019-03-17 DIAGNOSIS — Z79899 Other long term (current) drug therapy: Secondary | ICD-10-CM | POA: Insufficient documentation

## 2019-03-17 DIAGNOSIS — K219 Gastro-esophageal reflux disease without esophagitis: Secondary | ICD-10-CM | POA: Insufficient documentation

## 2019-03-17 DIAGNOSIS — J449 Chronic obstructive pulmonary disease, unspecified: Secondary | ICD-10-CM | POA: Diagnosis not present

## 2019-03-17 DIAGNOSIS — Z8249 Family history of ischemic heart disease and other diseases of the circulatory system: Secondary | ICD-10-CM | POA: Insufficient documentation

## 2019-03-17 DIAGNOSIS — Z86010 Personal history of colonic polyps: Secondary | ICD-10-CM | POA: Diagnosis not present

## 2019-03-17 DIAGNOSIS — Z7951 Long term (current) use of inhaled steroids: Secondary | ICD-10-CM | POA: Diagnosis not present

## 2019-03-17 DIAGNOSIS — D013 Carcinoma in situ of anus and anal canal: Secondary | ICD-10-CM | POA: Insufficient documentation

## 2019-03-17 DIAGNOSIS — F419 Anxiety disorder, unspecified: Secondary | ICD-10-CM | POA: Diagnosis not present

## 2019-03-17 DIAGNOSIS — Z880 Allergy status to penicillin: Secondary | ICD-10-CM | POA: Diagnosis not present

## 2019-03-17 DIAGNOSIS — K620 Anal polyp: Secondary | ICD-10-CM | POA: Diagnosis present

## 2019-03-17 DIAGNOSIS — E7800 Pure hypercholesterolemia, unspecified: Secondary | ICD-10-CM | POA: Diagnosis not present

## 2019-03-17 HISTORY — DX: Psoriasis, unspecified: L40.9

## 2019-03-17 HISTORY — DX: Generalized anxiety disorder: F41.1

## 2019-03-17 HISTORY — DX: Personal history of adenomatous and serrated colon polyps: Z86.0101

## 2019-03-17 HISTORY — DX: Other seasonal allergic rhinitis: J30.2

## 2019-03-17 HISTORY — DX: Rectal polyp: K62.1

## 2019-03-17 HISTORY — DX: Other specified postprocedural states: Z98.890

## 2019-03-17 HISTORY — DX: Other constipation: K59.09

## 2019-03-17 HISTORY — DX: Dermatitis, unspecified: L30.9

## 2019-03-17 HISTORY — PX: TRANSANAL EXCISION OF RECTAL MASS: SHX6134

## 2019-03-17 HISTORY — DX: Personal history of other malignant neoplasm of skin: Z85.828

## 2019-03-17 HISTORY — DX: Personal history of other mental and behavioral disorders: Z86.59

## 2019-03-17 HISTORY — DX: Personal history of colonic polyps: Z86.010

## 2019-03-17 LAB — SARS CORONAVIRUS 2 BY RT PCR (HOSPITAL ORDER, PERFORMED IN ~~LOC~~ HOSPITAL LAB): SARS Coronavirus 2: NEGATIVE

## 2019-03-17 SURGERY — EXCISION, MASS, RECTUM, ANAL APPROACH
Anesthesia: Monitor Anesthesia Care

## 2019-03-17 MED ORDER — ACETIC ACID 5 % SOLN
Status: DC | PRN
  Administered 2019-03-17: 1 via TOPICAL

## 2019-03-17 MED ORDER — OXYCODONE HCL 5 MG PO TABS
5.0000 mg | ORAL_TABLET | ORAL | Status: DC | PRN
  Filled 2019-03-17: qty 2

## 2019-03-17 MED ORDER — SODIUM CHLORIDE 0.9 % IV SOLN
250.0000 mL | INTRAVENOUS | Status: DC | PRN
  Filled 2019-03-17: qty 250

## 2019-03-17 MED ORDER — ACETAMINOPHEN 325 MG PO TABS
650.0000 mg | ORAL_TABLET | ORAL | Status: DC | PRN
  Filled 2019-03-17: qty 2

## 2019-03-17 MED ORDER — LACTATED RINGERS IV SOLN
INTRAVENOUS | Status: DC
  Administered 2019-03-17: 50 mL/h via INTRAVENOUS
  Filled 2019-03-17: qty 1000

## 2019-03-17 MED ORDER — LIDOCAINE 2% (20 MG/ML) 5 ML SYRINGE
INTRAMUSCULAR | Status: DC | PRN
  Administered 2019-03-17: 50 mg via INTRAVENOUS

## 2019-03-17 MED ORDER — HYDROCODONE-ACETAMINOPHEN 5-325 MG PO TABS: 1.0000 | ORAL_TABLET | Freq: Four times a day (QID) | ORAL | 0 refills | Status: DC | PRN

## 2019-03-17 MED ORDER — PROPOFOL 500 MG/50ML IV EMUL
INTRAVENOUS | Status: DC | PRN
  Administered 2019-03-17: 150 ug/kg/min via INTRAVENOUS

## 2019-03-17 MED ORDER — FENTANYL CITRATE (PF) 100 MCG/2ML IJ SOLN
25.0000 ug | INTRAMUSCULAR | Status: DC | PRN
  Filled 2019-03-17: qty 1

## 2019-03-17 MED ORDER — SODIUM CHLORIDE 0.9% FLUSH
3.0000 mL | INTRAVENOUS | Status: DC | PRN
  Filled 2019-03-17: qty 3

## 2019-03-17 MED ORDER — LIDOCAINE 2% (20 MG/ML) 5 ML SYRINGE
INTRAMUSCULAR | Status: AC
  Filled 2019-03-17: qty 5

## 2019-03-17 MED ORDER — ACETAMINOPHEN 500 MG PO TABS
1000.0000 mg | ORAL_TABLET | ORAL | Status: AC
  Administered 2019-03-17: 12:00:00 1000 mg via ORAL
  Filled 2019-03-17: qty 2

## 2019-03-17 MED ORDER — SODIUM CHLORIDE 0.9% FLUSH
3.0000 mL | Freq: Two times a day (BID) | INTRAVENOUS | Status: DC
  Filled 2019-03-17: qty 3

## 2019-03-17 MED ORDER — PROPOFOL 10 MG/ML IV BOLUS
INTRAVENOUS | Status: DC | PRN
  Administered 2019-03-17: 40 mg via INTRAVENOUS

## 2019-03-17 MED ORDER — ACETAMINOPHEN 500 MG PO TABS
ORAL_TABLET | ORAL | Status: AC
  Filled 2019-03-17: qty 1

## 2019-03-17 MED ORDER — BUPIVACAINE-EPINEPHRINE 0.5% -1:200000 IJ SOLN
INTRAMUSCULAR | Status: DC | PRN
  Administered 2019-03-17: 30 mL

## 2019-03-17 MED ORDER — ACETAMINOPHEN 650 MG RE SUPP
650.0000 mg | RECTAL | Status: DC | PRN
  Filled 2019-03-17: qty 1

## 2019-03-17 MED ORDER — PROPOFOL 500 MG/50ML IV EMUL
INTRAVENOUS | Status: AC
  Filled 2019-03-17: qty 50

## 2019-03-17 SURGICAL SUPPLY — 46 items
APL SKNCLS STERI-STRIP NONHPOA (GAUZE/BANDAGES/DRESSINGS) ×1
BENZOIN TINCTURE PRP APPL 2/3 (GAUZE/BANDAGES/DRESSINGS) ×3 IMPLANT
BLADE EXTENDED COATED 6.5IN (ELECTRODE) IMPLANT
BLADE HEX COATED 2.75 (ELECTRODE) ×3 IMPLANT
BLADE SURG 10 STRL SS (BLADE) ×3 IMPLANT
BRIEF STRETCH FOR OB PAD LRG (UNDERPADS AND DIAPERS) ×3 IMPLANT
CANISTER SUCT 3000ML PPV (MISCELLANEOUS) ×3 IMPLANT
COVER BACK TABLE 60X90IN (DRAPES) ×3 IMPLANT
COVER MAYO STAND STRL (DRAPES) ×3 IMPLANT
COVER WAND RF STERILE (DRAPES) ×6 IMPLANT
DECANTER SPIKE VIAL GLASS SM (MISCELLANEOUS) ×3 IMPLANT
DRAPE HYSTEROSCOPY (DRAPE) IMPLANT
DRAPE LAPAROTOMY 100X72 PEDS (DRAPES) ×3 IMPLANT
DRAPE SHEET LG 3/4 BI-LAMINATE (DRAPES) IMPLANT
DRAPE UTILITY XL STRL (DRAPES) ×3 IMPLANT
ELECT REM PT RETURN 9FT ADLT (ELECTROSURGICAL) ×3
ELECTRODE REM PT RTRN 9FT ADLT (ELECTROSURGICAL) ×1 IMPLANT
GAUZE SPONGE 4X4 12PLY STRL (GAUZE/BANDAGES/DRESSINGS) IMPLANT
GAUZE SPONGE 4X4 3PLY NS LF (GAUZE/BANDAGES/DRESSINGS) ×2 IMPLANT
GLOVE BIO SURGEON STRL SZ 6.5 (GLOVE) ×2 IMPLANT
GLOVE BIO SURGEONS STRL SZ 6.5 (GLOVE) ×1
GOWN STRL REUS W/TWL XL LVL3 (GOWN DISPOSABLE) ×3 IMPLANT
KIT SIGMOIDOSCOPE (SET/KITS/TRAYS/PACK) IMPLANT
KIT TURNOVER CYSTO (KITS) ×3 IMPLANT
LEGGING LITHOTOMY PAIR STRL (DRAPES) IMPLANT
NDL SAFETY ECLIPSE 18X1.5 (NEEDLE) IMPLANT
NEEDLE HYPO 18GX1.5 SHARP (NEEDLE)
NEEDLE HYPO 22GX1.5 SAFETY (NEEDLE) ×3 IMPLANT
NS IRRIG 500ML POUR BTL (IV SOLUTION) ×3 IMPLANT
PACK BASIN DAY SURGERY FS (CUSTOM PROCEDURE TRAY) ×3 IMPLANT
PAD ABD 8X10 STRL (GAUZE/BANDAGES/DRESSINGS) ×2 IMPLANT
PAD ARMBOARD 7.5X6 YLW CONV (MISCELLANEOUS) IMPLANT
PENCIL BUTTON HOLSTER BLD 10FT (ELECTRODE) ×3 IMPLANT
SPONGE SURGIFOAM ABS GEL 12-7 (HEMOSTASIS) IMPLANT
SUT CHROMIC 2 0 SH (SUTURE) ×3 IMPLANT
SUT CHROMIC 3 0 SH 27 (SUTURE) ×2 IMPLANT
SUT MON AB 3-0 SH 27 (SUTURE) ×3
SUT MON AB 3-0 SH27 (SUTURE) ×1 IMPLANT
SUT VIC AB 2-0 SH 27 (SUTURE)
SUT VIC AB 2-0 SH 27XBRD (SUTURE) IMPLANT
SYR CONTROL 10ML LL (SYRINGE) ×3 IMPLANT
TOWEL NATURAL 6PK STERILE (DISPOSABLE) ×3 IMPLANT
TRAY DSU PREP LF (CUSTOM PROCEDURE TRAY) ×3 IMPLANT
TUBE CONNECTING 12'X1/4 (SUCTIONS) ×1
TUBE CONNECTING 12X1/4 (SUCTIONS) ×2 IMPLANT
YANKAUER SUCT BULB TIP NO VENT (SUCTIONS) ×3 IMPLANT

## 2019-03-17 NOTE — Interval H&P Note (Signed)
History and Physical Interval Note:  03/17/2019 11:22 AM  Jean Williams  has presented today for surgery, with the diagnosis of DISTAL RECTAL POLYP.  The various methods of treatment have been discussed with the patient and family. After consideration of risks, benefits and other options for treatment, the patient has consented to  Procedure(s): TRANSANAL EXCISION OF RECTAL POLYP (N/A) as a surgical intervention.  The patient's history has been reviewed, patient examined, no change in status, stable for surgery.  I have reviewed the patient's chart and labs.  Questions were answered to the patient's satisfaction.     Rosario Adie, MD  [facility]

## 2019-03-17 NOTE — Transfer of Care (Signed)
Immediate Anesthesia Transfer of Care Note  Patient: Jean Williams  Procedure(s) Performed: Procedure(s) (LRB): TRANSANAL EXCISION OF RECTAL POLYP (N/A)  Patient Location: PACU  Anesthesia Type: MAC  Level of Consciousness: awake, alert , oriented and patient cooperative  Airway & Oxygen Therapy: Patient Spontanous Breathing and Patient connected to face mask oxygen  Post-op Assessment: Report given to PACU RN and Post -op Vital signs reviewed and stable  Post vital signs: Reviewed and stable  Complications: No apparent anesthesia complications  Last Vitals:  Vitals Value Taken Time  BP    Temp    Pulse    Resp 21 03/17/19 1241  SpO2    Vitals shown include unvalidated device data.  Last Pain:  Vitals:   03/17/19 1153  TempSrc: Oral  PainSc: 0-No pain      Patients Stated Pain Goal: 4 (03/17/19 1153)

## 2019-03-17 NOTE — Anesthesia Preprocedure Evaluation (Addendum)
Anesthesia Evaluation  Patient identified by MRN, date of birth, ID band Patient awake    Reviewed: Allergy & Precautions, NPO status , Patient's Chart, lab work & pertinent test results  Airway Mallampati: II  TM Distance: >3 FB     Dental   Pulmonary COPD, Current Smoker,    breath sounds clear to auscultation       Cardiovascular negative cardio ROS   Rhythm:Regular Rate:Normal     Neuro/Psych    GI/Hepatic Neg liver ROS, GERD  ,  Endo/Other  negative endocrine ROS  Renal/GU negative Renal ROS     Musculoskeletal   Abdominal   Peds  Hematology   Anesthesia Other Findings   Reproductive/Obstetrics                             Anesthesia Physical Anesthesia Plan  ASA: III  Anesthesia Plan: MAC   Post-op Pain Management:    Induction: Intravenous  PONV Risk Score and Plan: 2 and Treatment may vary due to age or medical condition, Ondansetron, Dexamethasone and Midazolam  Airway Management Planned: Simple Face Mask and Nasal Cannula  Additional Equipment:   Intra-op Plan:   Post-operative Plan:   Informed Consent: I have reviewed the patients History and Physical, chart, labs and discussed the procedure including the risks, benefits and alternatives for the proposed anesthesia with the patient or authorized representative who has indicated his/her understanding and acceptance.     Dental advisory given  Plan Discussed with: CRNA and Anesthesiologist  Anesthesia Plan Comments:         Anesthesia Quick Evaluation

## 2019-03-17 NOTE — Op Note (Signed)
03/17/2019  12:30 PM  PATIENT:  Jean Williams  57 y.o. female  Patient Care Team: [name], MD as PCP - General  PRE-OPERATIVE DIAGNOSIS:  DISTAL RECTAL POLYP  POST-OPERATIVE DIAGNOSIS:  ANAL CANAL POLYP  PROCEDURE:  ANAL EXAM UNDER ANESTHESIA, TRANSANAL EXCISION OF RECTAL POLYP, ANAL CANAL BIOPSY   Surgeon(s): Leighton Ruff, MD  ASSISTANT: none   ANESTHESIA:   local and MAC  SPECIMEN:  Source of Specimen:  anterior anal lesion, L lateral anal canal biopsy  DISPOSITION OF SPECIMEN:  PATHOLOGY  COUNTS:  YES  PLAN OF CARE: Discharge to home after PACU  PATIENT DISPOSITION:  PACU - hemodynamically stable.  INDICATION: 57 y.o. F with anal polyp seen on colonoscopy.  Biopsy showed AIN.   OR FINDINGS: anterior anal polyp, aceto-white staining L lateral anal canal  DESCRIPTION: the patient was identified in the preoperative holding area and taken to the OR where they were laid on the operating room table.  MAC anesthesia was induced without difficulty. The patient was then positioned in prone jackknife position with buttocks gently taped apart.  The patient was then prepped and draped in usual sterile fashion.  SCDs were noted to be in place prior to the initiation of anesthesia. A surgical timeout was performed indicating the correct patient, procedure, positioning and need for preoperative antibiotics.  A rectal block was performed using Marcaine with epinephrine.    I began with a digital rectal exam.  There were no masses palpated.  I then placed a Hill-Ferguson anoscope into the anal canal and evaluated this completely.  The patient had a noninflamed left lateral hemorrhoid.  There was no other obvious pathology noted.  I placed a acetic acid soaked sponge within the anal canal to evaluate for dysplastic lesions.  After this was allowed to soak for approximately 2 minutes, the area was reevaluated.  There was a polyp noted in the anterior anal canal which was approximately 1  cm in diameter.  This was removed sharply using Metzenbaum scissors.  The polypectomy site was closed using a running 3-0 chromic suture.  I then reevaluated the remaining anal canal.  There was diffuse acetowhite staining throughout but there was a portion in the left lateral anal canal that was staining much darker.  I decided to perform a biopsy of this area as the borders were not discrete enough to remove entirely.  The biopsy site was also closed with a 3-0 chromic suture.  Lidocaine ointment and a dressing were then applied.  Hemostasis was good.  All counts were correct per operating room staff.  The patient was awakened from anesthesia and sent to the postanesthesia care unit in stable condition.

## 2019-03-17 NOTE — Discharge Instructions (Addendum)

## 2019-03-17 NOTE — Progress Notes (Signed)
Pt incontinent of urine upon standing at bedside.  However no urine trail to bathroom,.  Large amt incontinent urine when standing in front of commode. Pt given warm wash cloths to wash legs and feet. New footies, gown and rectal dressing ( gauze 4x4's abd pad and perineal pads) applied.

## 2019-03-17 NOTE — Anesthesia Postprocedure Evaluation (Signed)
Anesthesia Post Note  Patient: Jean Williams  Procedure(s) Performed: TRANSANAL EXCISION OF RECTAL POLYP (N/A )     Patient location during evaluation: PACU Anesthesia Type: MAC Level of consciousness: awake Pain management: pain level controlled Vital Signs Assessment: vitals unstable Cardiovascular status: stable Postop Assessment: no apparent nausea or vomiting Anesthetic complications: no    Last Vitals:  Vitals:   03/17/19 1315 03/17/19 1348  BP: 134/88 (!) 146/97  Pulse: 86 87  Resp: 11 16  Temp:  (!) 36.3 C  SpO2: 97% 96%    Last Pain:  Vitals:   03/17/19 1348  TempSrc:   PainSc: 0-No pain                 Ezell Poke

## 2019-03-18 ENCOUNTER — Encounter (HOSPITAL_BASED_OUTPATIENT_CLINIC_OR_DEPARTMENT_OTHER): Payer: Self-pay | Admitting: General Surgery

## 2019-03-21 NOTE — Progress Notes (Signed)
Pt called and results discussed with pt and pt's RN.  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

## 2019-03-23 ENCOUNTER — Telehealth: Payer: Self-pay | Admitting: Gastroenterology

## 2019-03-23 NOTE — Telephone Encounter (Signed)
Called care giver back and let her know Dr. Marcello Moores would be following-up on the surgery area, and if  she hasn't heard from them about a F/U appt. To call them. Also patient's repeat colonoscopy would still be 3 yrs.

## 2019-03-23 NOTE — Telephone Encounter (Signed)
Called patient back and spoke to April (care giver). Stated the surgeon who did the excision of patient's rectal polyp on 03/17/19 recommended she F/U with [place] on surveillance of it since path showed it as anal CA. Dr. Doyne Keel recommendation from patient's Colonoscopy on 12/30/18 was a 3 yr. F/U colonoscopy. She wants to know if that changes the timing of her next colonoscopy or not

## 2019-03-23 NOTE — Telephone Encounter (Signed)
I spoke with Dr. Marcello Moores of surgery about this who treated her. She is going to follow up the patient for this in her office and discuss further with her, she should call Dr. Marcello Moores' office to schedule a follow up. No need for flex sig / sooner surveillance for AIN, keep surveillance colonoscopy at same interval thanks.

## 2019-03-28 ENCOUNTER — Telehealth: Payer: Self-pay | Admitting: *Deleted

## 2019-03-28 NOTE — Telephone Encounter (Signed)
April calls because pt was Rx'd fexofenadine by psych and April needs to know which one to give pt.  She has loratadine from [place] and fexofenadine from psych.  Please call April with an answer.  To PCP.  Christen Bame, CMA

## 2019-03-29 ENCOUNTER — Other Ambulatory Visit: Payer: Self-pay | Admitting: Family Medicine

## 2019-03-29 DIAGNOSIS — Z1231 Encounter for screening mammogram for malignant neoplasm of breast: Secondary | ICD-10-CM

## 2019-03-29 NOTE — Telephone Encounter (Signed)
Spoke with Mrs. O'Neal's caregiver April Thompson. I informed her of the note that was left by Dr. Pilar Plate. She said that she has not given neither medication. She was waiting on verbal from the Doctor as to which one to give. I informed her that Dr. Pilar Plate stated that it look like that the Fexofendiane has the most benefit, and to discontinue the Loratadine. She read on the bottle that Dr. Andy Gauss had prescribed the Loratadine.  So she said that she will start giving her the Fexofendiane as soon as she receive a discontinue letter stating that she is to stop the Loratadine and start the Fexofendiane. Fax letter to [phone]  ATTN: April Thompson. Salvatore Marvel, CMA

## 2019-03-29 NOTE — Telephone Encounter (Signed)
I cannot see the documentation from psychiatry. Please call the Jean Williams.  Is there a specific reason that fexofenadine was given when Jean Williams already had loratadine?  If there is no specific reason, I would recommend whichever medication seems to provide the greatest relief. If Fexofenadine has a specific benefit noted by psych, I would recommend using the fexofenadine and discontinue the loratadine.  Do not use them both in the same day.  Too much of these medications can lead to confusion.

## 2019-03-29 NOTE — Telephone Encounter (Signed)
April calls to check status.  She needs to know ASAP.  She will continue loratadine until she hears back from Dr. Pilar Plate. Christen Bame, CMA

## 2019-03-31 NOTE — Telephone Encounter (Signed)
Order sent.

## 2019-04-26 ENCOUNTER — Encounter: Payer: Self-pay | Admitting: Family Medicine

## 2019-04-26 ENCOUNTER — Other Ambulatory Visit: Payer: Self-pay

## 2019-04-26 ENCOUNTER — Ambulatory Visit (INDEPENDENT_AMBULATORY_CARE_PROVIDER_SITE_OTHER): Payer: Medicare Other | Admitting: Family Medicine

## 2019-04-26 VITALS — BP 122/84 | HR 113 | Wt 171.0 lb

## 2019-04-26 DIAGNOSIS — E782 Mixed hyperlipidemia: Secondary | ICD-10-CM | POA: Diagnosis not present

## 2019-04-26 DIAGNOSIS — R911 Solitary pulmonary nodule: Secondary | ICD-10-CM | POA: Diagnosis not present

## 2019-04-26 DIAGNOSIS — F17200 Nicotine dependence, unspecified, uncomplicated: Secondary | ICD-10-CM | POA: Diagnosis not present

## 2019-04-26 DIAGNOSIS — R918 Other nonspecific abnormal finding of lung field: Secondary | ICD-10-CM | POA: Diagnosis not present

## 2019-04-26 DIAGNOSIS — R220 Localized swelling, mass and lump, head: Secondary | ICD-10-CM | POA: Insufficient documentation

## 2019-04-26 DIAGNOSIS — F209 Schizophrenia, unspecified: Secondary | ICD-10-CM | POA: Diagnosis not present

## 2019-04-26 HISTORY — DX: Solitary pulmonary nodule: R91.1

## 2019-04-26 MED ORDER — TRIAMCINOLONE ACETONIDE 0.025 % EX OINT: 1.0000 "application " | TOPICAL_OINTMENT | Freq: Two times a day (BID) | CUTANEOUS | 1 refills | Status: DC | PRN

## 2019-04-26 NOTE — Assessment & Plan Note (Addendum)
Because of pain currently managed by psychiatrist.  Blood work not available through epic. -Follow-up CBC, lipid panel, BMP

## 2019-04-26 NOTE — Patient Instructions (Signed)
It was great to see you today here is a quick summary of what we talked about:  Smoking cessation-I think it is great that you are down to 6 cigarettes/day.  Please continue to cut back as much as you can.  Please come back in for a longer conversation if and when you feel ready to stop smoking.  Skin rash-looks like this is an eczema flareup.  Continue using the topical steroid ointment 2-3 times a day.  Once it settles down, you can continue to use the Eucerin which will help keep in the moisture that it does not get worse.  Blood work-we will draw some labs today and will let you know if there are any abnormalities.

## 2019-04-26 NOTE — Assessment & Plan Note (Signed)
-  Follow-up CT chest noncontrast (order placed)

## 2019-04-26 NOTE — Assessment & Plan Note (Addendum)
Per patient report, this has been present for years without any noticeable changes.  On exam today, 1 cm x 1 cm and immobile mass.  Nontender to palpation.  Not malleable.  No changes to overlying skin.  No characteristics suspicious for carcinoma. CT head from 04/2017 shows small left frontal exocytosis benign in appearance. Not irritating to Jean Williams.  No further investigation at this time.

## 2019-04-26 NOTE — Progress Notes (Signed)
Contacted Jean Williams by phone following her office visit.  She was informed that she had an elevated heart rate while in the clinic.  She denies symptoms of chest pain, shortness of breath, fever.  She was informed this is likely a normal variant of her heart rate and not an indication of any malady.  She was given red flag symptoms to watch for.

## 2019-04-26 NOTE — Assessment & Plan Note (Signed)
Continues to smoke 1 pack/day.  Not interested in further decreasing her smoking at this time.  Was encouraged to follow-up with a visit specifically for smoking cessation.

## 2019-04-26 NOTE — Assessment & Plan Note (Signed)
Follow-up lipid panel 

## 2019-04-26 NOTE — Progress Notes (Addendum)
    Subjective:  Jean Williams is a 58 y.o. female who presents to the [facility] today with a chief complaint of clinical exam.   HPI:  Index finger callus During her recent visit, she had a callus shaved from her right first finger.  She reports that initially her callus was significantly improved but it appears to slowly be coming back.  Is not currently agitating her.  She notes that she has calluses on her thumb and index finger where her fingers oppose when another while she fidgets.  She is not currently seeking any intervention for these calluses.  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  She is aware that she needs to follow-up with GI for routine surveillance.  Nicotine use disorder She reports she is excited about her decrease to 6 cigarettes/day.  She recognizes that cigarettes are contributing to poor health maintenance.  She is not currently interested in decreasing her cigarette use further.     Routine cancer screening Pap smear, mammogram, colonoscopy up-to-date.  No recent unintentional weight loss.  CTA chest from 01/2018 shows 3 mm left lower lobe pulmonary nodule.  Recommended follow-up in 12 months patient is high risk.  Left forehead mass She reports that this mass has been on her forehead for years and has never bothered her.  She reports it never caused her any pain or discomfort.  No evidence of redness or swelling or infection.  This is brought up when she was asked about any concerning skin lesions.  Overall, she is not concerned about it and it has remained unchanged for many years.  Chief Complaint noted Review of Symptoms - see HPI PMH - Smoking status noted.    Objective:  Physical Exam: BP 122/84   Pulse (!) 113   Wt 171 lb (77.6 kg)   LMP 06/10/2011   SpO2 97%   BMI 27.60 kg/m    Gen: NAD, resting comfortably HEENT: Left forehead mass, 1 cm x 1 cm.  Hard, nonmobile.  Nontender to palpation.  No distortion of overlying skin.  CV: RRR with no murmurs appreciated Pulm: NWOB, CTAB with no crackles, wheezes, or rhonchi GI: Normal bowel sounds present. Soft, Nontender, Nondistended. MSK: no edema, cyanosis, or clubbing noted Skin: warm, dry Neuro: grossly normal, moves all extremities Psych: Normal affect and thought content  No results found for this or any previous visit (from the past 72 hour(s)).   Assessment/Plan:  Mixed hyperlipidemia -Follow-up lipid panel  Lung nodule -Follow-up CT chest noncontrast (order placed)  Subcutaneous mass of head Per patient report, this has been present for years without any noticeable changes.  On exam today, 1 cm x 1 cm and immobile mass.  Nontender to palpation.  Not malleable.  No changes to overlying skin.  No characteristics suspicious for carcinoma. CT head from 04/2017 shows small left frontal exocytosis benign in appearance. Not irritating to [name].  No further investigation at this time.  Schizophrenia, unspecified type ([place]) Because of pain currently managed by psychiatrist.  Blood work not available through epic. -Follow-up CBC, lipid panel, BMP  TOBACCO DEPENDENCE Continues to smoke 1 pack/day.  Not interested in further decreasing her smoking at this time.  Was encouraged to follow-up with a visit specifically for smoking cessation.  Right finger callus No further assessment or intervention.

## 2019-04-27 LAB — CBC
Hematocrit: 43.4 % (ref 34.0–46.6)
Hemoglobin: 14.5 g/dL (ref 11.1–15.9)
MCH: 30.2 pg (ref 26.6–33.0)
MCHC: 33.4 g/dL (ref 31.5–35.7)
MCV: 90 fL (ref 79–97)
Platelets: 312 10*3/uL (ref 150–450)
RBC: 4.8 x10E6/uL (ref 3.77–5.28)
RDW: 12.6 % (ref 11.7–15.4)
WBC: 8.8 10*3/uL (ref 3.4–10.8)

## 2019-04-27 LAB — BASIC METABOLIC PANEL
BUN/Creatinine Ratio: 12 (ref 9–23)
BUN: 11 mg/dL (ref 6–24)
CO2: 25 mmol/L (ref 20–29)
Calcium: 9.5 mg/dL (ref 8.7–10.2)
Chloride: 105 mmol/L (ref 96–106)
Creatinine, Ser: 0.94 mg/dL (ref 0.57–1.00)
GFR calc Af Amer: 77 mL/min/{1.73_m2} (ref >59)
GFR calc non Af Amer: 67 mL/min/{1.73_m2} (ref >59)
Glucose: 100 mg/dL — ABNORMAL HIGH (ref 65–99)
Potassium: 4.3 mmol/L (ref 3.5–5.2)
Sodium: 143 mmol/L (ref 134–144)

## 2019-04-27 LAB — LIPID PANEL
Chol/HDL Ratio: 4.1 ratio (ref 0.0–4.4)
Cholesterol, Total: 136 mg/dL (ref 100–199)
HDL: 33 mg/dL — ABNORMAL LOW (ref >39)
LDL Chol Calc (NIH): 50 mg/dL (ref 0–99)
Triglycerides: 352 mg/dL — ABNORMAL HIGH (ref 0–149)
VLDL Cholesterol Cal: 53 mg/dL — ABNORMAL HIGH (ref 5–40)

## 2019-05-10 ENCOUNTER — Ambulatory Visit
Admission: RE | Admit: 2019-05-10 | Discharge: 2019-05-10 | Disposition: A | Discharge status: Home / Self Care | Payer: Medicare Other | Source: Ambulatory Visit | Attending: Family Medicine | Admitting: Family Medicine

## 2019-05-10 ENCOUNTER — Telehealth: Payer: Self-pay

## 2019-05-10 ENCOUNTER — Other Ambulatory Visit: Payer: Medicare Other

## 2019-05-10 ENCOUNTER — Other Ambulatory Visit: Payer: Self-pay

## 2019-05-10 DIAGNOSIS — R911 Solitary pulmonary nodule: Secondary | ICD-10-CM

## 2019-05-10 DIAGNOSIS — R918 Other nonspecific abnormal finding of lung field: Secondary | ICD-10-CM

## 2019-05-10 IMAGING — CT CT CHEST W/O CM
1 series · 14 of 34 positions shown, 18 images · non-contrast
Comparison: CT chest pulmonary angiogram, 01/24/2018, CT abdomen
pelvis, 11/10/2017

CLINICAL DATA: Follow-up lung nodules, smoker, cough

EXAM:
CT CHEST WITHOUT CONTRAST
TECHNIQUE: Multidetector CT imaging of the chest was performed following the
standard protocol without IV contrast.

[Series 2: chest w/(date) · axial · 0.68mm/px · z∈[-260,-30]mm · 14 of 136 slices shown, 18 images]
[im 11/136  mediastinal]
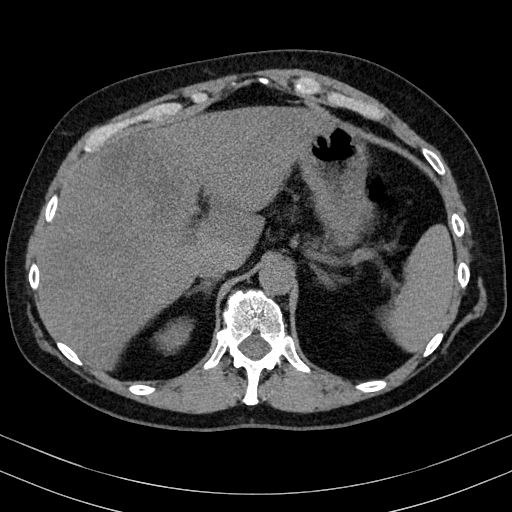
[im 11/136  lung]
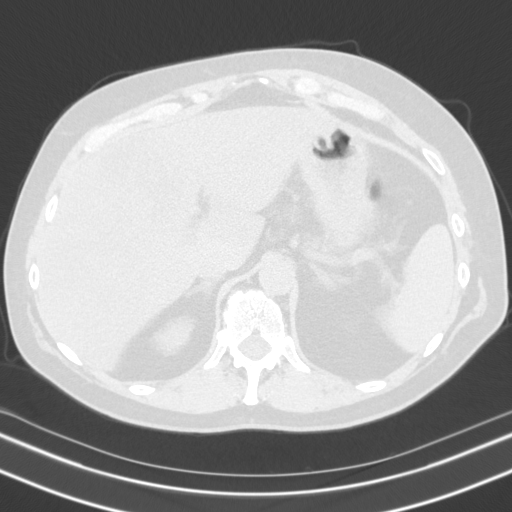
[im 21/136  lung]
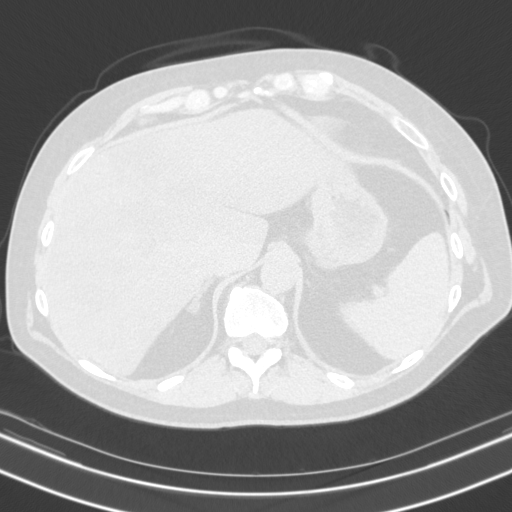
[im 28/136  lung]
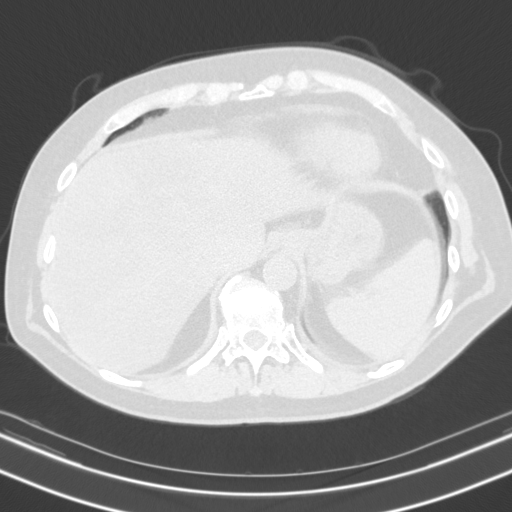
[im 41/136  lung]
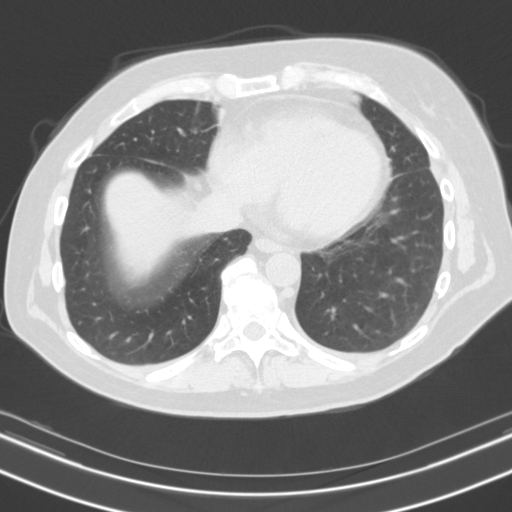
[im 51/136  mediastinal]
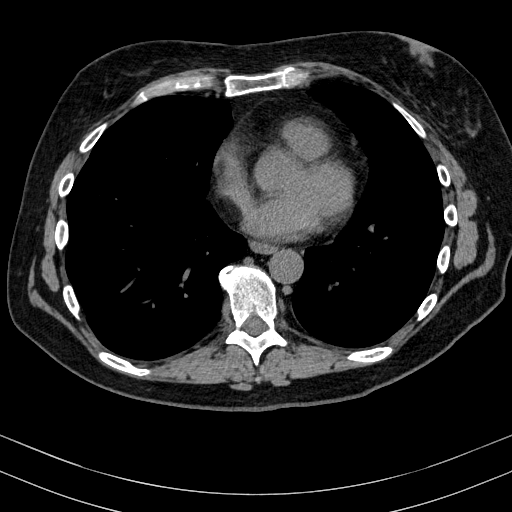
[im 51/136  lung]
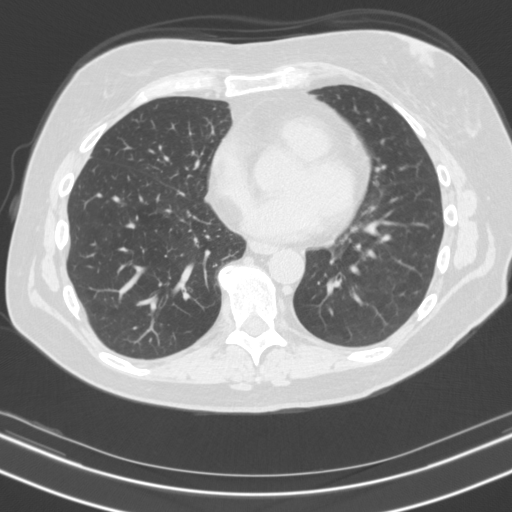
[im 56/136  lung]
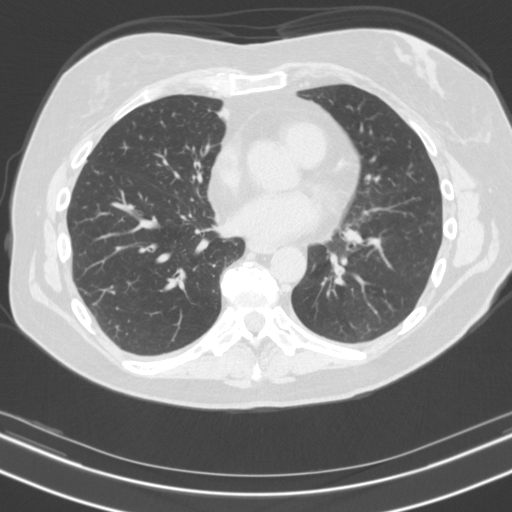
[im 64/136  lung]
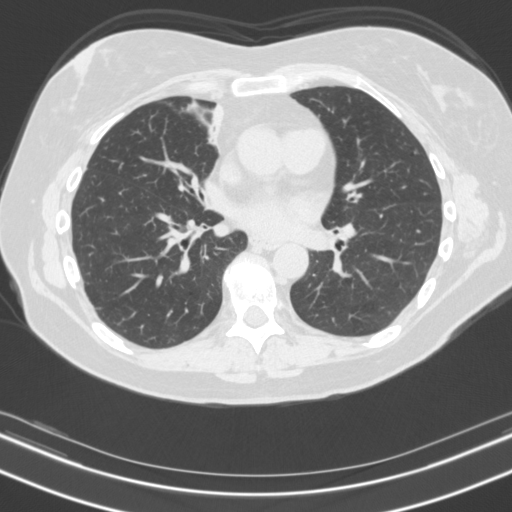
[im 73/136  lung]
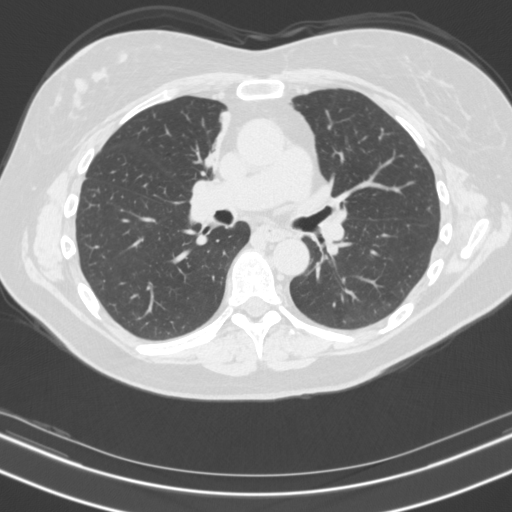
[im 81/136  mediastinal]
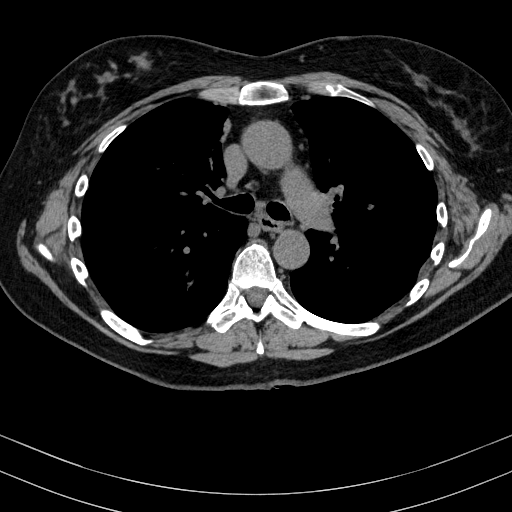
[im 81/136  lung]
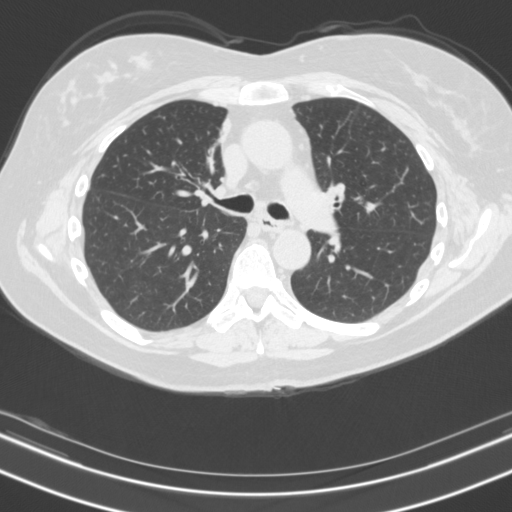
[im 86/136  lung]
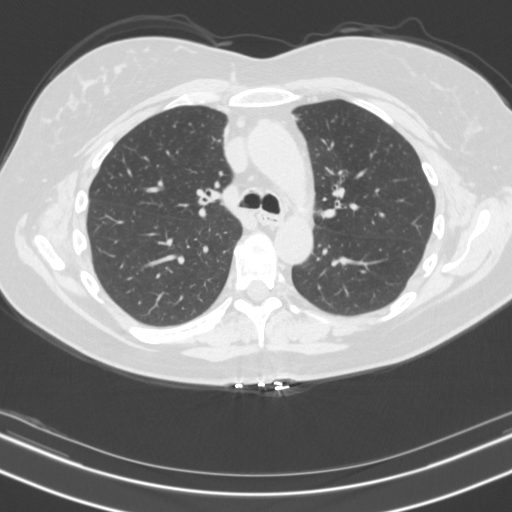
[im 101/136  lung]
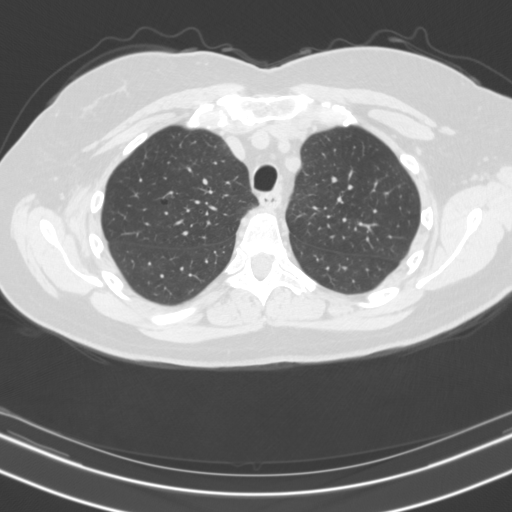
[im 109/136  lung]
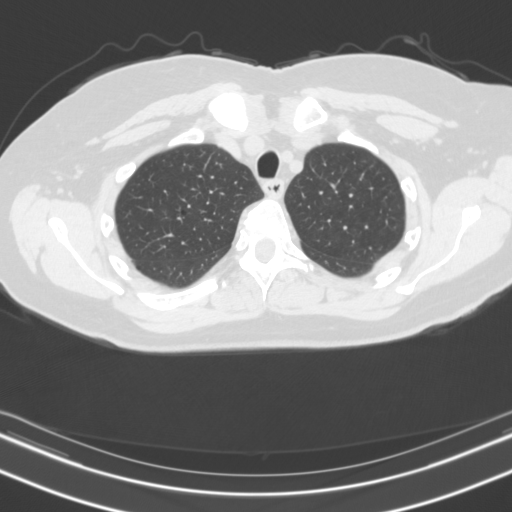
[im 116/136  mediastinal]
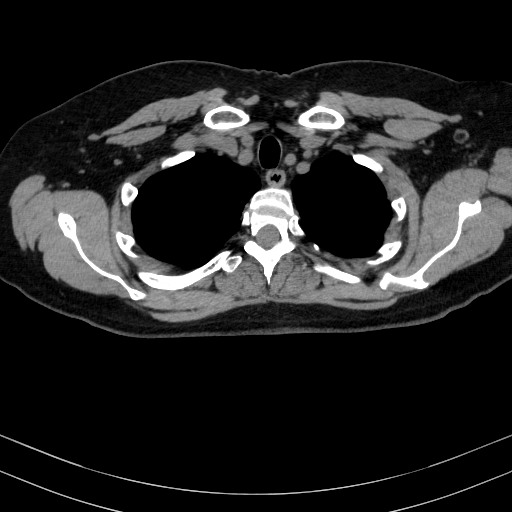
[im 116/136  lung]
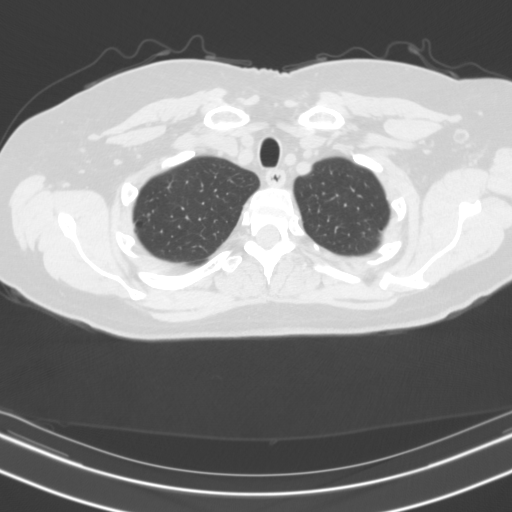
[im 126/136  lung]
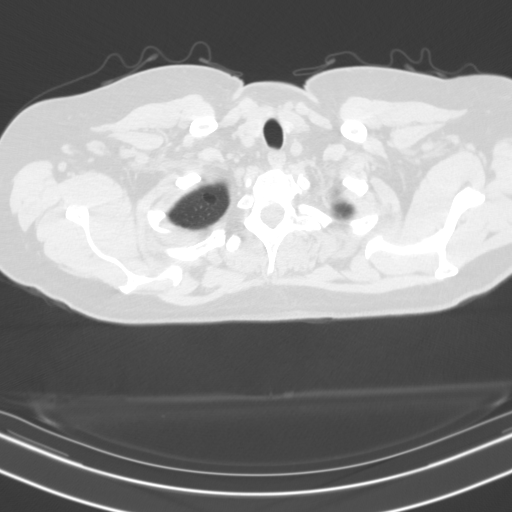

[14 of 34 positions shown; findings below may reference images not displayed]

FINDINGS: Cardiovascular: Scattered aortic atherosclerosis. Normal heart size.
LAD coronary artery calcifications. No pericardial effusion.

Mediastinum/Nodes: No enlarged mediastinal, hilar, or axillary lymph
nodes. Thyroid gland, trachea, and esophagus demonstrate no
significant findings.

Lungs/Pleura: Mild, diffuse bilateral bronchial wall thickening,
most notable in the lower lobe bronchi bilaterally and similar to
prior examination. There is increased paramedian consolidation of
the medial right upper lobe with proximal bronchial wall thickening
and plugging (series 3, image 71). Stable, benign 3 mm pulmonary
nodule of the left lower lobe (series 3, image 61). No pleural
effusion or pneumothorax.

Upper Abdomen: Ill-defined, irregular hypodensity of the liver
parenchyma (series 2, image 120).

Musculoskeletal: No chest wall mass or suspicious bone lesions
identified.
IMPRESSION: 1. Stable, benign 3 mm pulmonary nodule of the left lower lobe
(series 3, image 61). No further routine CT follow-up is required
for this nodule.

2. Mild, diffuse bilateral bronchial wall thickening, most notable
in the lower lobe bronchi bilaterally and similar to prior
examination. Findings are consistent with nonspecific infectious or
inflammatory bronchitis.

3. There is increased paramedian consolidation of the medial right
upper lobe with proximal bronchial wall thickening and plugging
(series 3, image 71). Findings are consistent with atypical
infection, particularly atypical mycobacterium with this location.
There is no significant pulmonary nodularity.

4. Ill-defined, irregular hypodensity of the liver parenchyma
(series 2, image 120). This appearance is of uncertain significance,
and may reflect perfusion abnormality or uneven hepatic steatosis,
however mass or metastatic disease is not excluded, particularly
given that this appearance is new in comparison to CT examination
dated 11/10/2017. Recommend multiphasic contrast enhanced
examination of the abdomen and pelvis to further evaluate.

5.  Coronary artery disease.  Aortic Atherosclerosis (T54WP-T02.2).

These results will be called to the ordering clinician or
representative by the Radiologist Assistant, and communication
documented in the PACS or zVision Dashboard.

## 2019-05-10 NOTE — Telephone Encounter (Signed)
Received call to nurse line from [facility] regarding patients recent CT. The radiologist wanted to specifically point out impression 4. Will forward to PCP.

## 2019-05-12 ENCOUNTER — Ambulatory Visit: Payer: Medicare Other

## 2019-05-13 ENCOUNTER — Ambulatory Visit: Payer: Medicare Other

## 2019-05-13 ENCOUNTER — Other Ambulatory Visit: Payer: Self-pay | Admitting: Family Medicine

## 2019-05-13 DIAGNOSIS — K769 Liver disease, unspecified: Secondary | ICD-10-CM

## 2019-05-13 NOTE — Progress Notes (Signed)
Spoke with radiology who recommended following up with MR abdomen for further evaluation of the hepatic hypodensity.  MRI ordered.   Spoke with Jean Williams about imaging for her liver.  She acknowledged understanding.  She denies SOB, fevers, chest pain recently.  Low suspicion for pneumonia at this time. Will continue to monitor.  -MR ordered -message to [name] about scheduling MR appointment  [name], MD

## 2019-05-16 ENCOUNTER — Telehealth: Payer: Self-pay

## 2019-05-16 NOTE — Telephone Encounter (Signed)
Attempted to reach pts caregiver for a 2nd time. No answer. No VM set up. To inform pts caregiver April. The earliest that Mrs. Kincer's MRI at [facility] at the [address]. Is  scheduled for  Nov. 29th at 1:00pm arrival time 12:40pm. Pt is  not to  eat or drink 4 hours before appt. Salvatore Marvel, CMA

## 2019-05-16 NOTE — Telephone Encounter (Signed)
Patients caregiver calls nurse line returning [facility] call. April informed of MRI date and time and location.

## 2019-05-17 ENCOUNTER — Other Ambulatory Visit: Payer: Self-pay

## 2019-05-17 ENCOUNTER — Ambulatory Visit
Admission: RE | Admit: 2019-05-17 | Discharge: 2019-05-17 | Disposition: A | Discharge status: Home / Self Care | Payer: Medicare Other | Source: Ambulatory Visit | Attending: Family Medicine | Admitting: Family Medicine

## 2019-05-17 ENCOUNTER — Encounter: Payer: Self-pay | Admitting: Family Medicine

## 2019-05-17 DIAGNOSIS — Z1231 Encounter for screening mammogram for malignant neoplasm of breast: Secondary | ICD-10-CM

## 2019-05-17 IMAGING — MG DIGITAL SCREENING BILAT W/ TOMO W/ CAD
8 series · 8 of 24 positions shown · non-contrast
Comparison: Previous exam(s).

CLINICAL DATA: Screening.

EXAM:
DIGITAL SCREENING BILATERAL MAMMOGRAM WITH TOMO AND CAD

[L CC synth-2D]
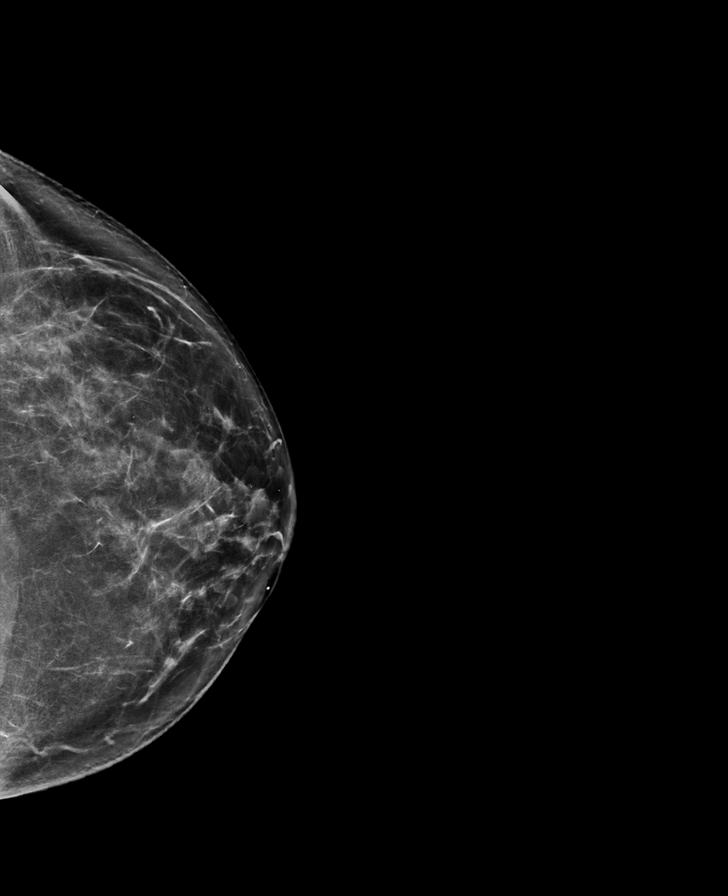

[R CC synth-2D]
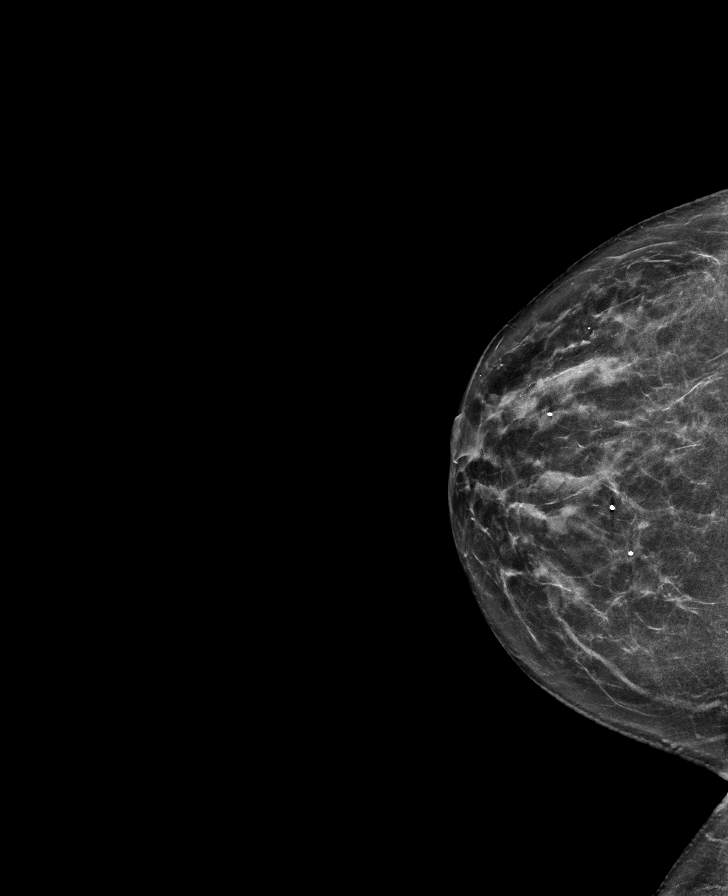

[L MLO synth-2D]
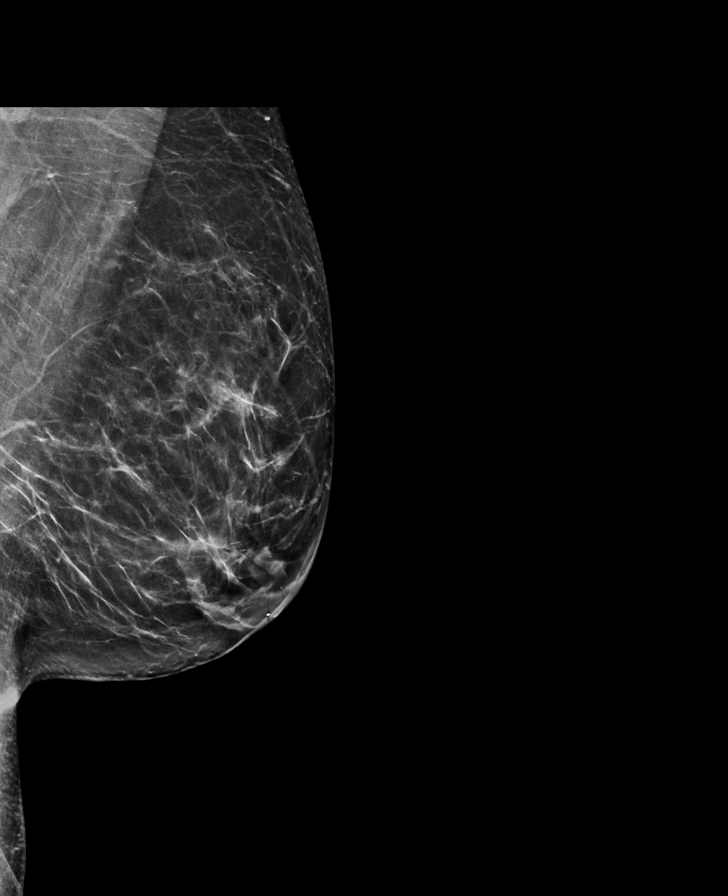

[R MLO synth-2D]
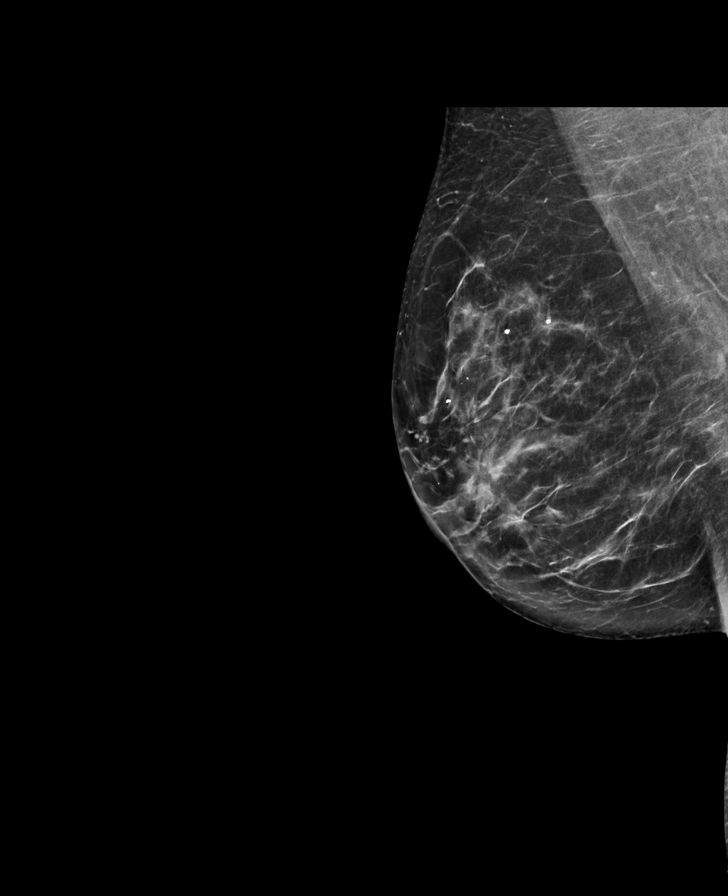

[L MLO tomo · tomo slice 39/76.0]
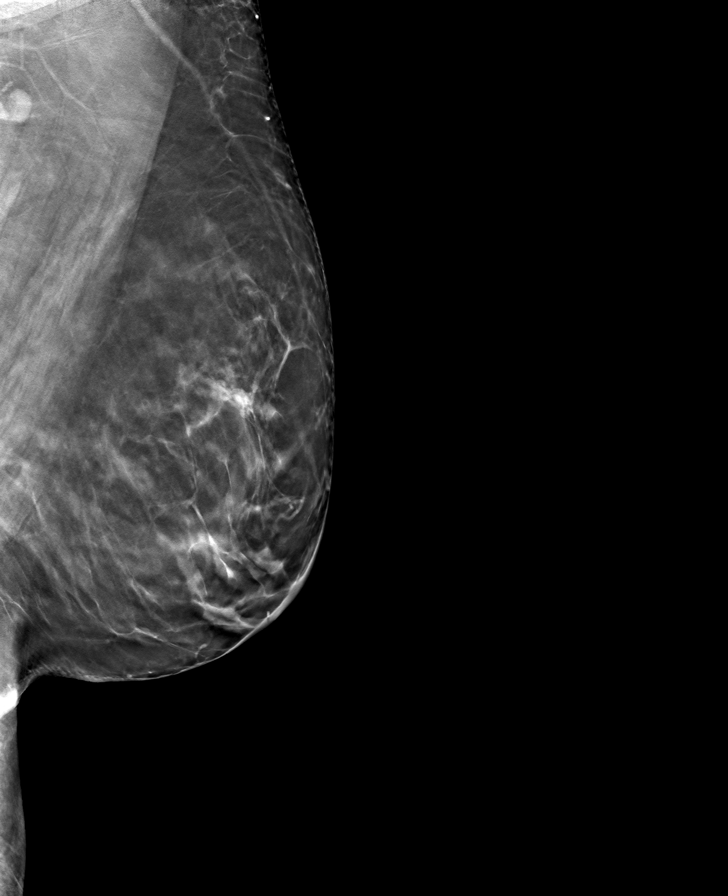

[R CC tomo · tomo slice 35/69.0]
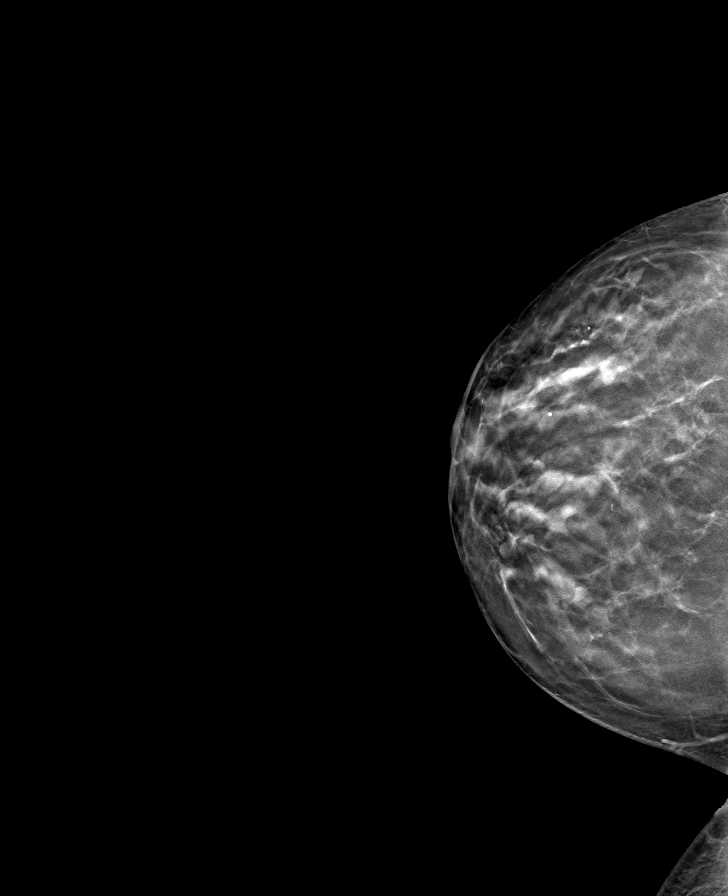

[R MLO tomo · tomo slice 39/76.0]
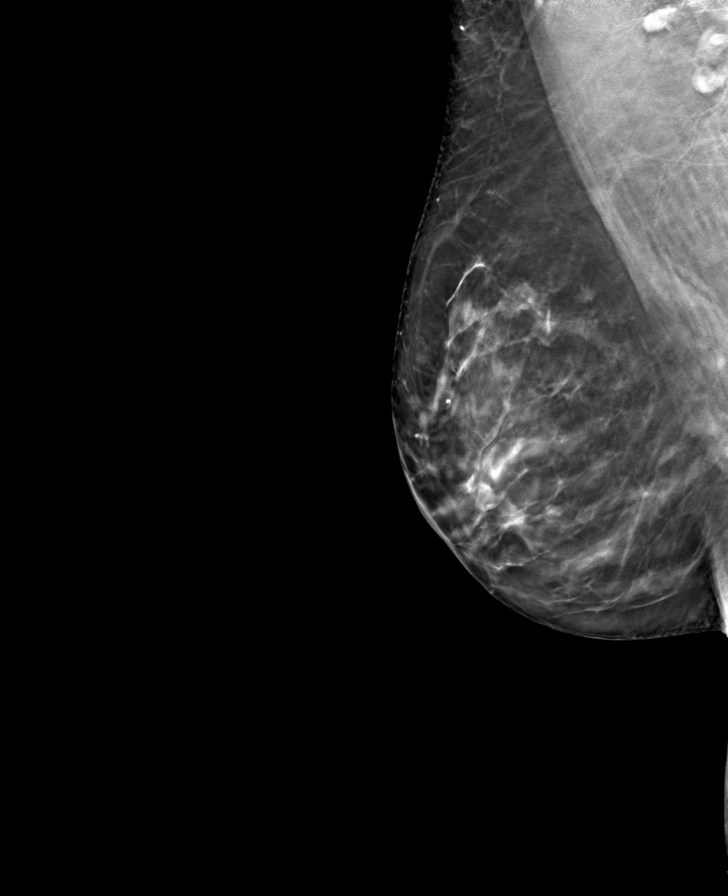

[L CC tomo · tomo slice 39/78.0]
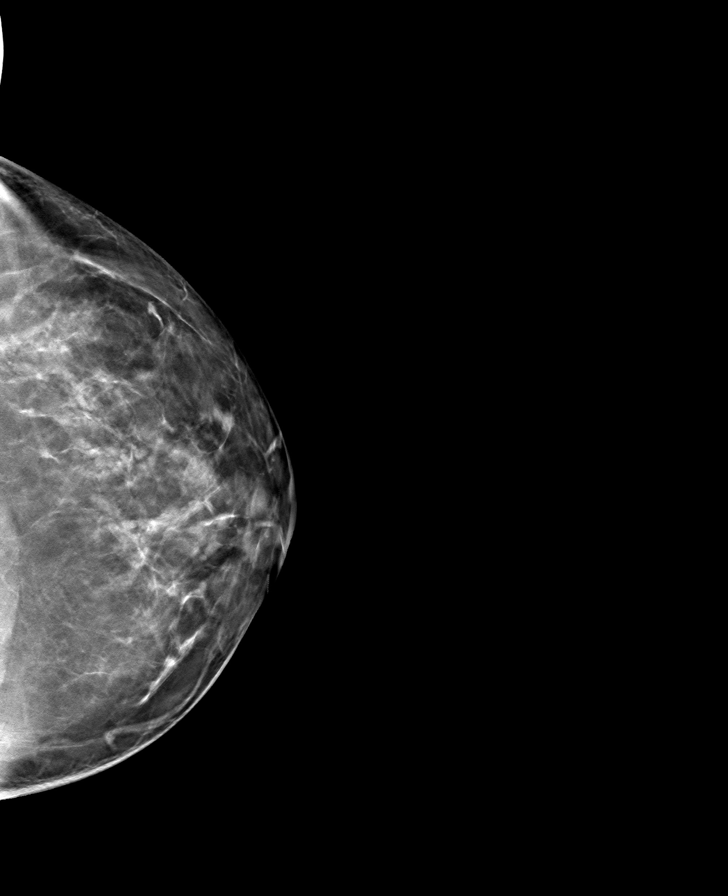

[8 of 24 positions shown; findings below may reference images not displayed]

ACR Breast Density Category b: There are scattered areas of
fibroglandular density.
FINDINGS: There are no findings suspicious for malignancy. Images were
processed with CAD.
IMPRESSION: No mammographic evidence of malignancy. A result letter of this
screening mammogram will be mailed directly to the patient.

RECOMMENDATION:
Screening mammogram in one year. (Code:[id number])

BI-RADS CATEGORY  1: Negative.

## 2019-06-05 ENCOUNTER — Other Ambulatory Visit: Payer: Medicare Other

## 2019-06-14 ENCOUNTER — Telehealth: Payer: Self-pay | Admitting: *Deleted

## 2019-06-14 NOTE — Telephone Encounter (Signed)
April wants to know if she can be on some Vit D drops as "most of the other residents are" at [facility].  If so will need to be [facility].   To PCP. Christen Bame, CMA

## 2019-06-15 ENCOUNTER — Inpatient Hospital Stay: Admission: RE | Admit: 2019-06-15 | Payer: Medicare Other | Source: Ambulatory Visit

## 2019-06-16 ENCOUNTER — Ambulatory Visit
Admission: RE | Admit: 2019-06-16 | Discharge: 2019-06-16 | Disposition: A | Discharge status: Home / Self Care | Payer: Medicare Other | Source: Ambulatory Visit | Attending: Family Medicine | Admitting: Family Medicine

## 2019-06-16 ENCOUNTER — Other Ambulatory Visit: Payer: Self-pay

## 2019-06-16 DIAGNOSIS — K769 Liver disease, unspecified: Secondary | ICD-10-CM

## 2019-06-16 IMAGING — MR MR ABDOMEN WO/W CM
11 of 16 series · 26 of 48 positions shown · IV contrast (16 ml multihance)
Comparison: Noncontrast chest CT on 05/10/2019, and noncontrast AP
CT on 11/10/2017

CLINICAL DATA: Abnormal liver on recent chest CT. Suspected liver
mass.

EXAM:
MRI ABDOMEN WITHOUT AND WITH CONTRAST
TECHNIQUE: Multiplanar multisequence MR imaging of the abdomen was performed
both before and after the administration of intravenous contrast.
CONTRAST:  16mL MULTIHANCE GADOBENATE DIMEGLUMINE 529 MG/ML IV SOLN

[Series 3: T2 · coronal · 5.0mm · 1.56mm/px · 1 of 17 slices shown (1 of 2)]
[im 1/17]
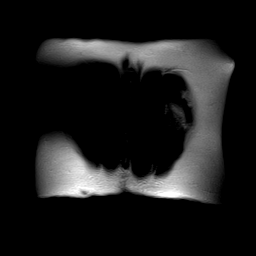

[Series 4: axial tru fisp · axial · 5.0mm · 1.45mm/px · 1 of 40 slices shown]
[im 1/40]
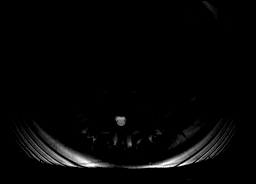

[Series 5: T2 · axial · 6.5mm · 0.74mm/px · 1 of 34 slices shown (2 of 2)]
[im 1/34]
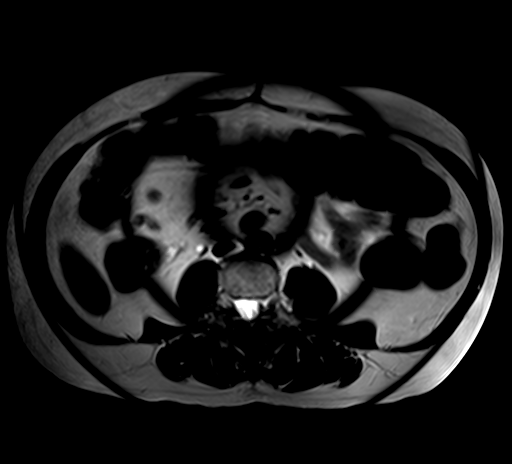

[Series 6: [id number] · axial · 6.0mm · 1.98mm/px · z∈[-125,+132]mm · 3 of 102 slices shown]
[im 1/102]
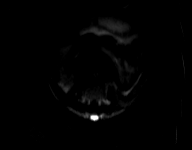
[im 51/102]
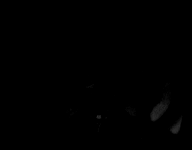
[im 102/102]
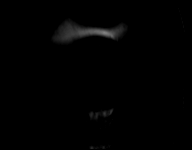

[Series 7: [id number]_adc · axial · 6.0mm · 1.98mm/px · 1 of 34 slices shown]
[im 1/34]
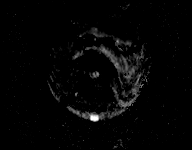

[Series 8: axial in out · axial · 6.0mm · 0.74mm/px · z∈[-164,+71]mm · 2 of 70 slices shown]
[im 1/70]
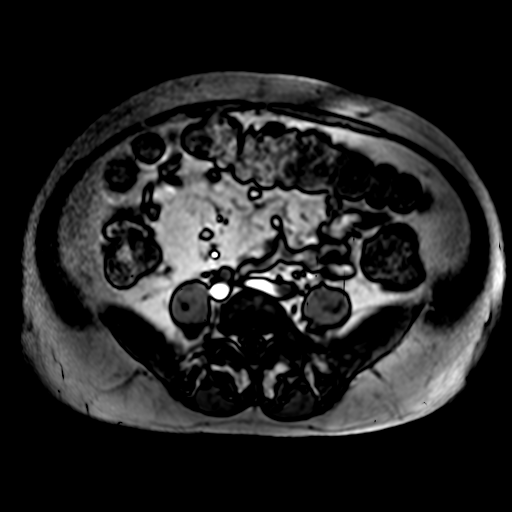
[im 70/70]
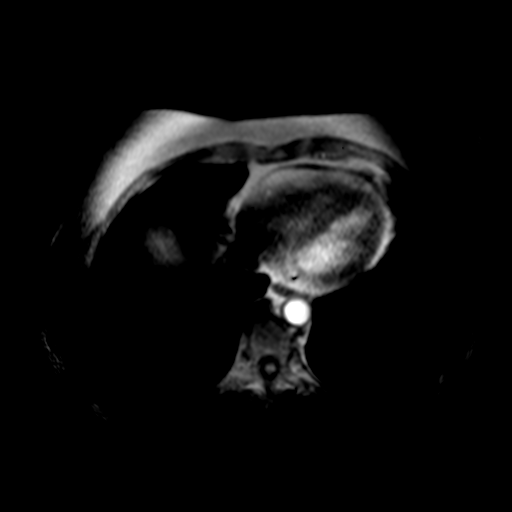

[Series 9: T1 dynamic · axial · non-contrast · 2.3mm · 0.78mm/px · z∈[-156,+62]mm · 4 of 96 slices shown]
[im 1/96]
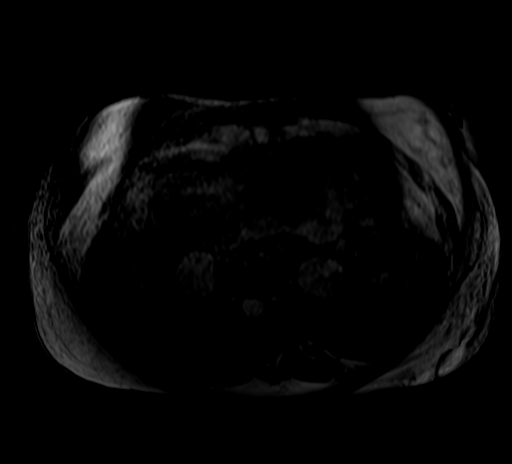
[im 32/96]
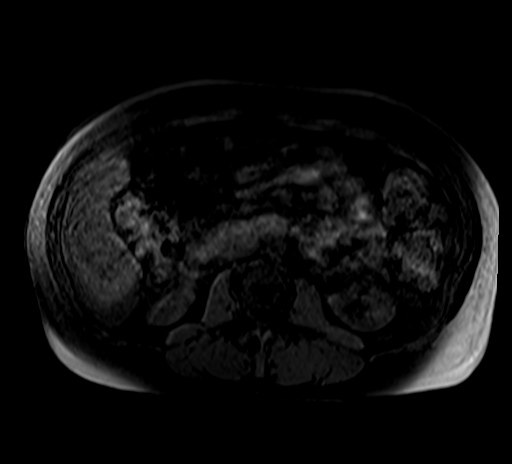
[im 64/96]
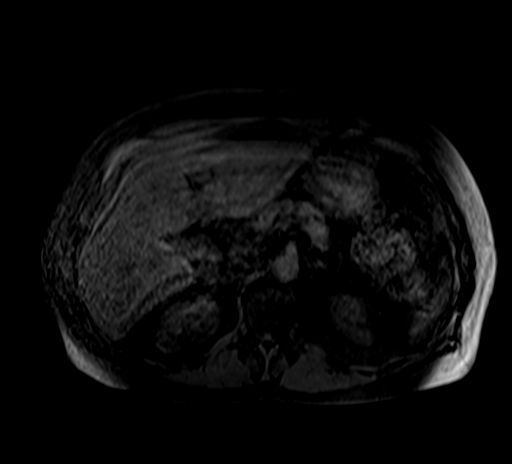
[im 96/96]
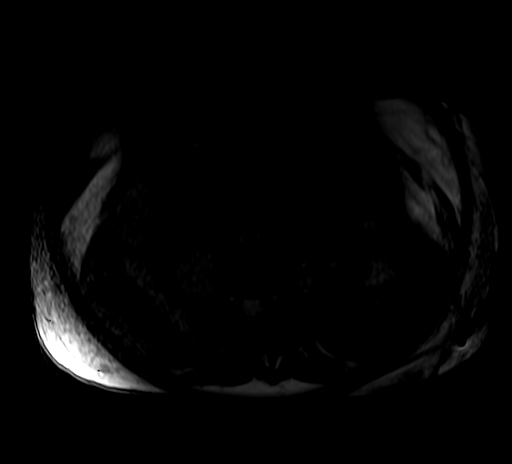

[Series 10: post 25 sec · axial · 2.3mm · 0.78mm/px · z∈[-156,+62]mm · 4 of 96 slices shown]
[im 1/96]
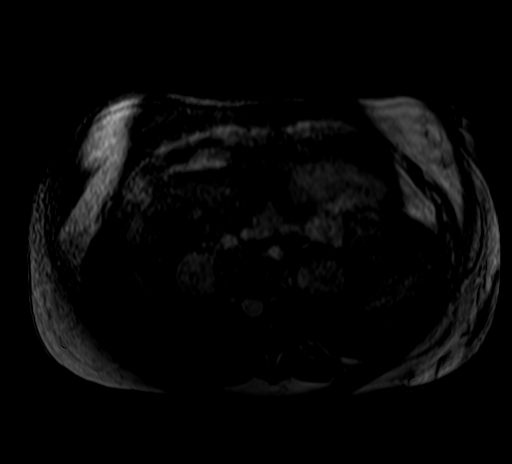
[im 32/96]
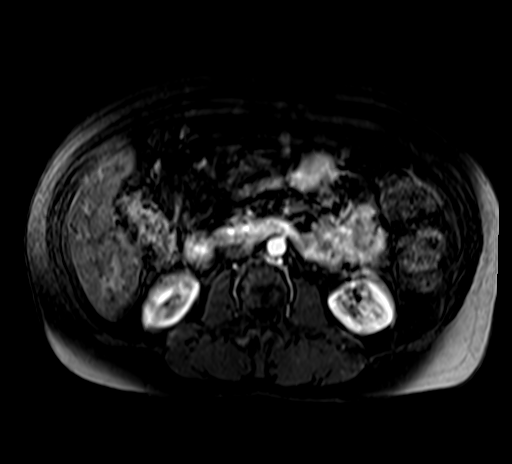
[im 64/96]
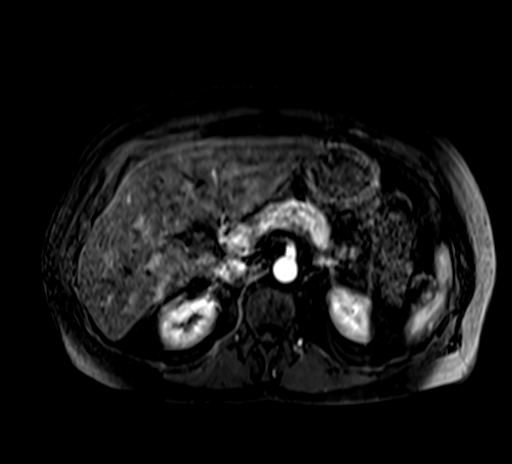
[im 96/96]
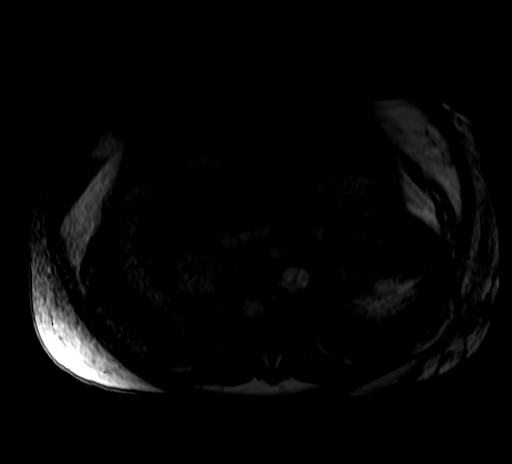

[Series 11: post 25 sec_sub · axial · 2.3mm · 0.78mm/px · z∈[-156,+62]mm · 4 of 96 slices shown]
[im 1/96]
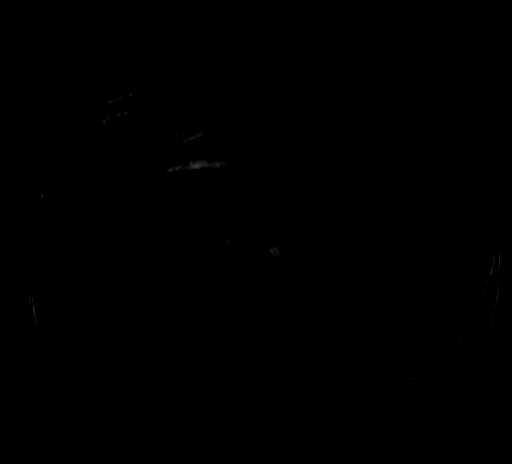
[im 32/96]
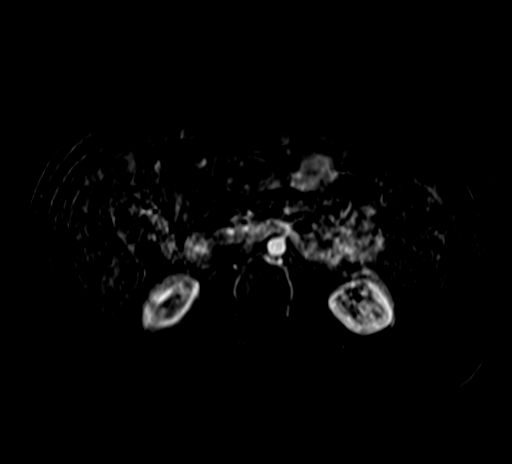
[im 64/96]
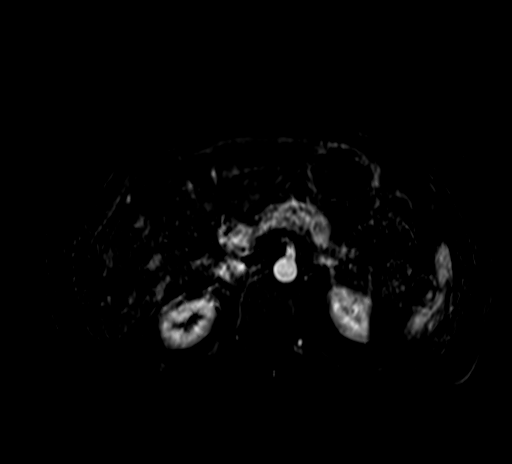
[im 96/96]
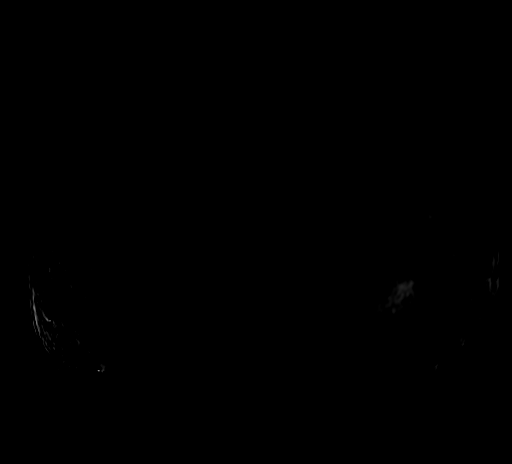

[Series 12: post 45 sec · axial · 2.3mm · 0.78mm/px · z∈[-156,+62]mm · 4 of 96 slices shown]
[im 1/96]
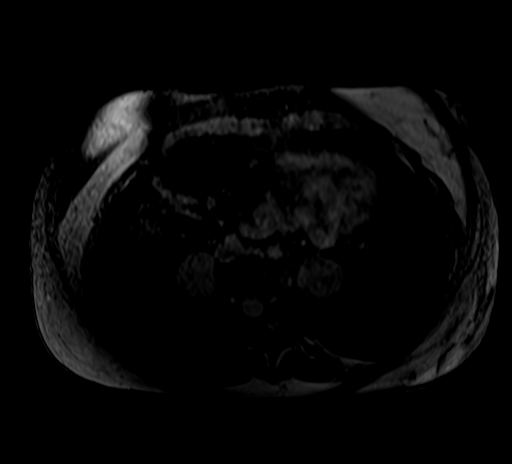
[im 32/96]
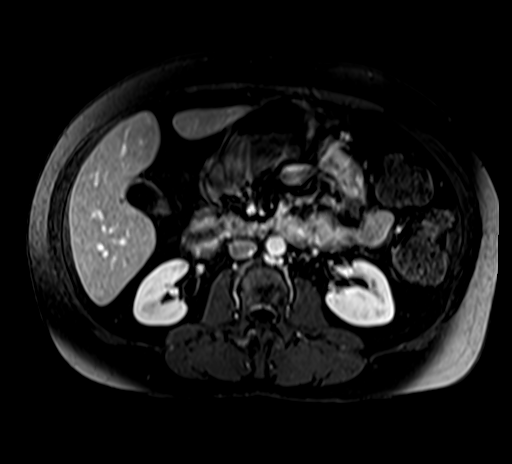
[im 64/96]
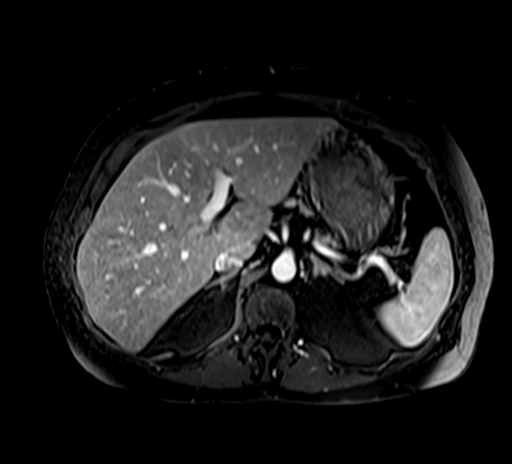
[im 96/96]
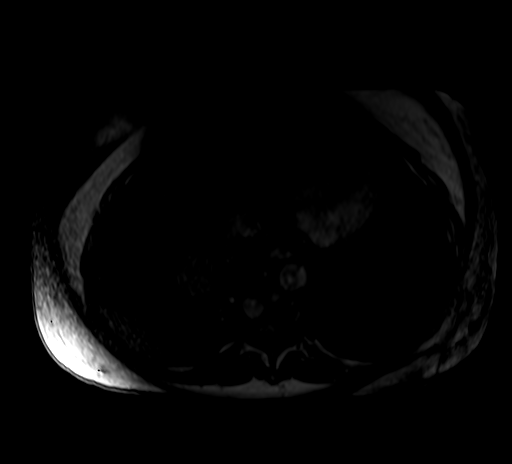

[Series 13: post 45 sec_sub · axial · 2.3mm · 0.78mm/px · 1 of 96 slices shown]
[im 1/96]
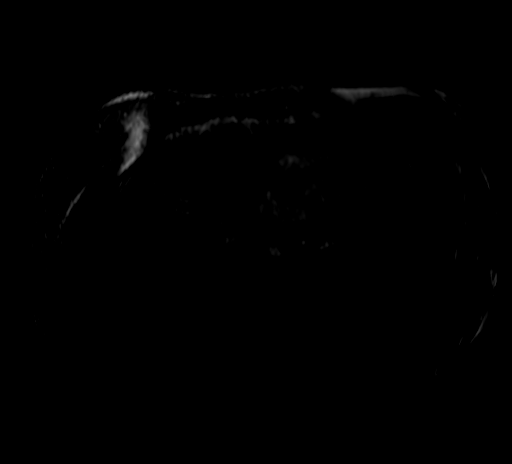

[26 of 48 positions shown; findings below may reference images not displayed]

FINDINGS: Lower chest: No acute findings.

Hepatobiliary: Diffuse geographic pattern of severe hepatic
steatosis is seen. No liver masses are identified. Gallbladder is
unremarkable. No evidence of biliary ductal dilatation.

Pancreas:  No mass or inflammatory changes.

Spleen:  Within normal limits in size and appearance.

Adrenals/Urinary Tract: No masses identified. No evidence of
hydronephrosis.

Stomach/Bowel: Visualized portion unremarkable.

Vascular/Lymphatic: No pathologically enlarged lymph nodes
identified. No abdominal aortic aneurysm.

Other:  None.

Musculoskeletal:  No suspicious bone lesions identified.
IMPRESSION: Diffuse geographic pattern of severe hepatic steatosis. No evidence
of hepatic neoplasm or other significant abnormality.

## 2019-06-16 MED ORDER — GADOBENATE DIMEGLUMINE 529 MG/ML IV SOLN
16.0000 mL | Freq: Once | INTRAVENOUS | Status: AC | PRN
  Administered 2019-06-16: 15:00:00 16 mL via INTRAVENOUS

## 2019-06-17 ENCOUNTER — Telehealth (INDEPENDENT_AMBULATORY_CARE_PROVIDER_SITE_OTHER): Payer: Medicare Other | Admitting: Family Medicine

## 2019-06-17 DIAGNOSIS — L2082 Flexural eczema: Secondary | ICD-10-CM

## 2019-06-17 DIAGNOSIS — K760 Fatty (change of) liver, not elsewhere classified: Secondary | ICD-10-CM | POA: Diagnosis not present

## 2019-06-17 MED ORDER — TRIAMCINOLONE ACETONIDE 0.1 % EX OINT: 1.0000 "application " | TOPICAL_OINTMENT | Freq: Two times a day (BID) | CUTANEOUS | 1 refills | Status: DC

## 2019-06-17 NOTE — Progress Notes (Signed)
[facility] Telemedicine Visit  Patient consented to have virtual visit. Method of visit: Video  Encounter participants: Patient: Jean Williams - located at assisted living facility Provider: [name] - located at [facility] Others (if applicable): April Thompson, RN  Chief Complaint: Rash and follow-up MRI  HPI:  Hepatic steatosis Her MRI showed extensive fatty infiltration of her liver but no evidence of masses or concern for liver cancer.  Does not currently drink alcohol.  No current complaints of abdominal pain, nausea, vomiting.  Eczema She noted a rash on her neck, arms and legs that is been present for months but is recently been worsening.  At her last visit to the clinic, she was prescribed a triamcinolone ointment 0.025% and applies that to her skin twice daily.  She noted that this does seem to help the itching and redness and does occasionally cause the rash to go away.  Once the rash is gone, she applies Eucerin to the rash until it returns and she applies the steroids again.  For the past several days, the rash over her neck, shoulders, the elbow and knees have been flaring up.  She wants to know if there is anything else that can be done for rash.   ROS: per HPI  Pertinent PMHx: Psoriasis  Exam: General: Well-appearing in no acute distress. Respiratory: Breathing comfortably on room air.  Able to complete long sentences without effort.  Assessment/Plan:  Nonalcoholic fatty liver disease Briefly discussed that nonalcoholic fatty liver disease is most commonly related to diet or medications.  Not currently taking any medications commonly associated with hepatic steatosis.  Last LFTs done over 1 year ago were within normal limits.  Healthy diet encouraged.  No hepatitis B vaccine on file. -Discussed hep B vaccine during next clinic visit -Consider CMP next clinic visit  Eczema She appears to have a previous diagnosis of both eczema and  psoriasis.  Based on location on this exam, this is most consistent with an eczema eruption.  Skin exam is limited due to a virtual visit it did appear to in a distribution consistent with eczema (flexor surfaces) with significant excoriations.  She is currently taking topical steroids with some benefit.  She may benefit from increased potency.  Will treat with topical steroids for now and assess in clinic in 1 month as diagnosis may be easier in person. -Triamcinolone 0.1% ointment prescribed to twice daily -Apply Eucerin once rash improves with steroid ointment -Follow-up in clinic in 1 month to ensure improvement    Time spent during visit with patient: 15 minutes

## 2019-06-17 NOTE — Assessment & Plan Note (Addendum)
Briefly discussed that nonalcoholic fatty liver disease is most commonly related to diet or medications.  Not currently taking any medications commonly associated with hepatic steatosis.  Last LFTs done over 1 year ago were within normal limits.  Healthy diet encouraged.  No hepatitis B vaccine on file. -Discuss hep B vaccine during next clinic visit -Consider CMP next clinic visit

## 2019-06-17 NOTE — Assessment & Plan Note (Signed)
She appears to have a previous diagnosis of both eczema and psoriasis.  Based on location on this exam, this is most consistent with an eczema eruption.  Skin exam is limited due to a virtual visit it did appear to in a distribution consistent with eczema (flexor surfaces) with significant excoriations.  She is currently taking topical steroids with some benefit.  She may benefit from increased potency.  Will treat with topical steroids for now and assess in clinic in 1 month as diagnosis may be easier in person. -Triamcinolone 0.1% ointment prescribed to twice daily -Apply Eucerin once rash improves with steroid ointment -Follow-up in clinic in 1 month to ensure improvement

## 2019-06-24 NOTE — Telephone Encounter (Signed)
To PCP to followup on request.  Christen Bame, CMA

## 2019-06-25 NOTE — Telephone Encounter (Signed)
Pt and RN were called and informed that there was no indication that vitamin D is necessary at this time or that Vit D drops were helpful for the prevention of COVID infection.

## 2019-07-11 ENCOUNTER — Other Ambulatory Visit: Payer: Medicare Other

## 2019-07-15 ENCOUNTER — Telehealth: Payer: Self-pay

## 2019-07-15 NOTE — Telephone Encounter (Signed)
Phone message received regarding 6 month medication review. April from agency states that form was faxed over on Monday 07/11/2019. Was unable to locate form at this time. Requested form to be faxed again for completion.

## 2019-07-21 IMAGING — CT CT ANGIO CHEST
2 of 6 series · 19 of 36 positions shown · IV contrast (ISOVUE 370)
Comparison: CT abdomen pelvis 11/10/2017

CLINICAL DATA: Shortness of breath

EXAM:
CT ANGIOGRAPHY CHEST WITH CONTRAST
TECHNIQUE: Multidetector CT imaging of the chest was performed using the
standard protocol during bolus administration of intravenous
contrast. Multiplanar CT image reconstructions and MIPs were
obtained to evaluate the vascular anatomy.
CONTRAST:  100mL AJUMQ5-SD3 IOPAMIDOL (AJUMQ5-SD3) INJECTION 76%

[Series 5: thins · axial · 0.68mm/px · z∈[+1142,+1369]mm · 18 of 253 slices shown]
[im 13/253  lung]
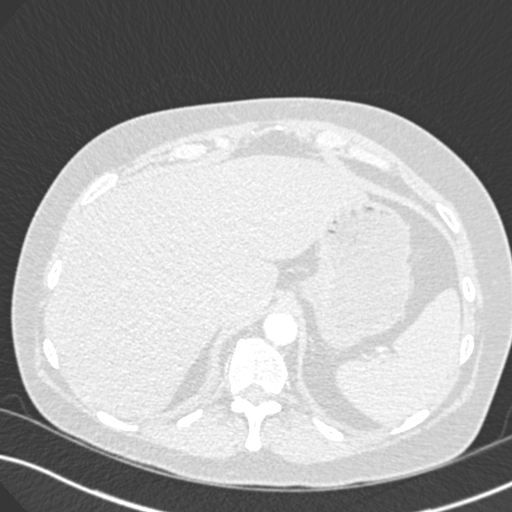
[im 26/253  mediastinal]
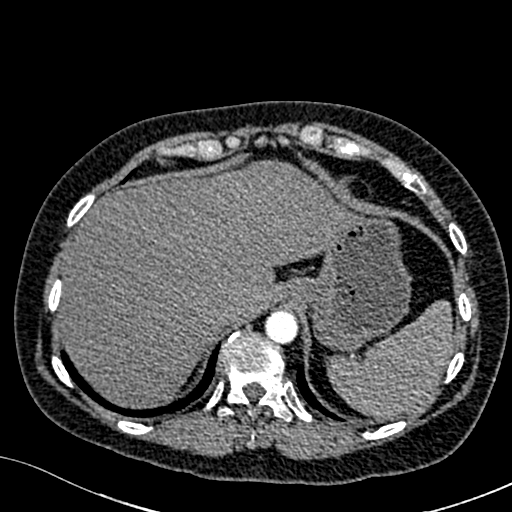
[im 38/253  lung]
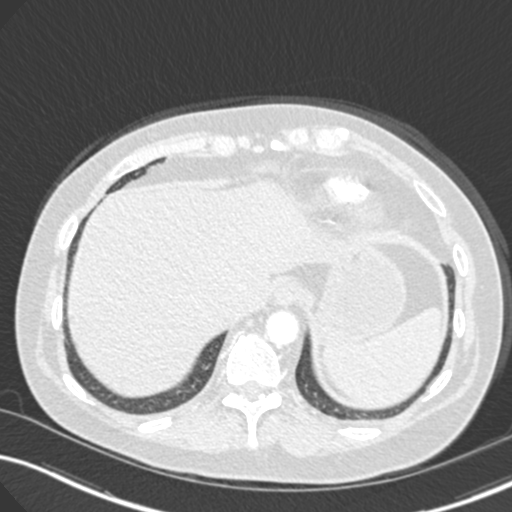
[im 51/253  mediastinal]
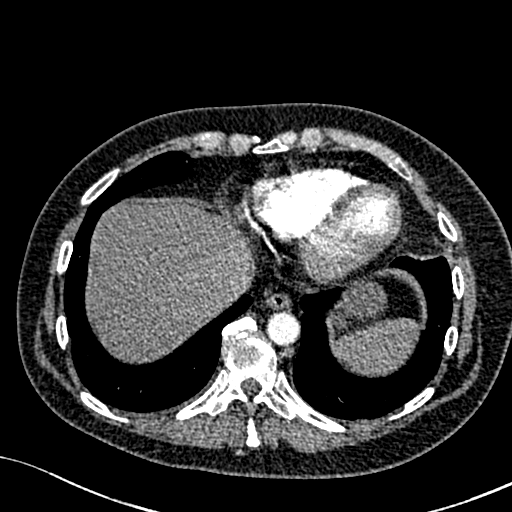
[im 64/253  lung]
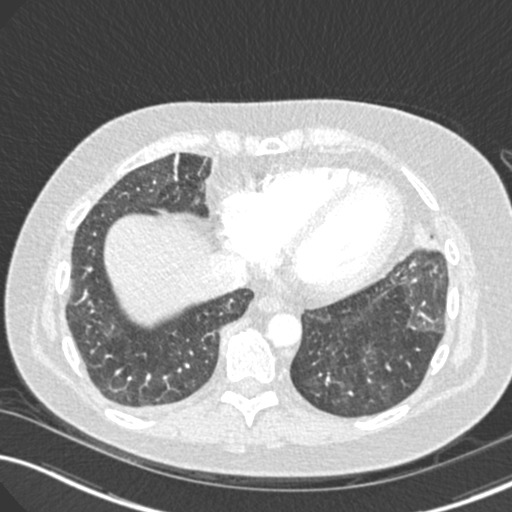
[im 76/253  mediastinal]
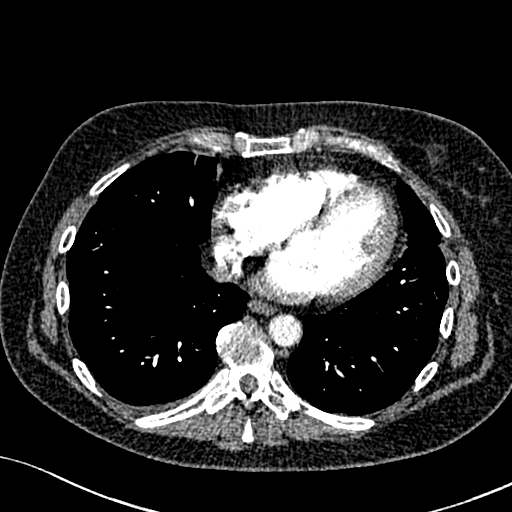
[im 89/253  lung]
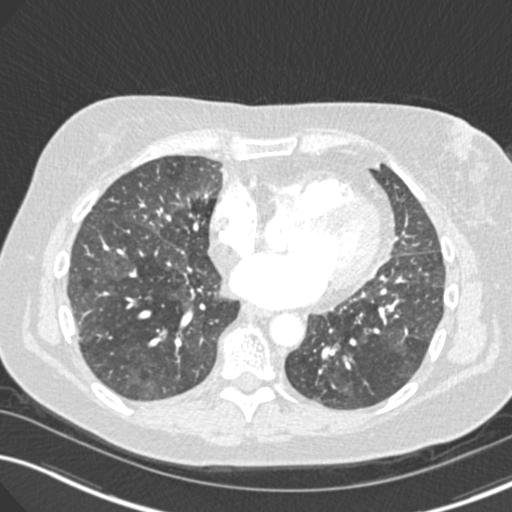
[im 101/253  mediastinal]
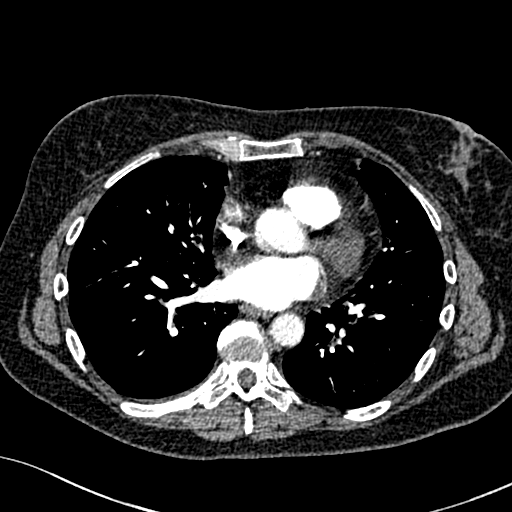
[im 114/253  lung]
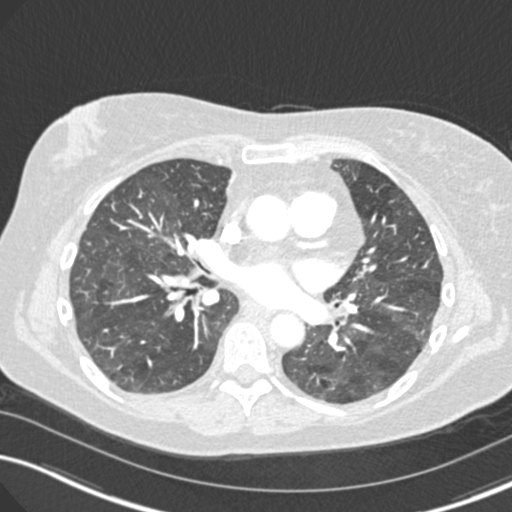
[im 139/253  mediastinal]
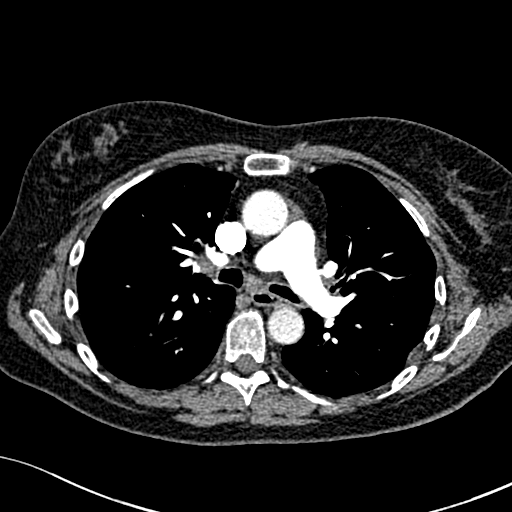
[im 152/253  lung]
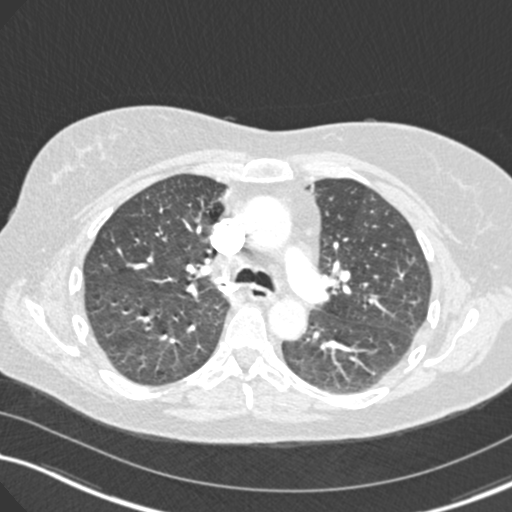
[im 164/253  mediastinal]
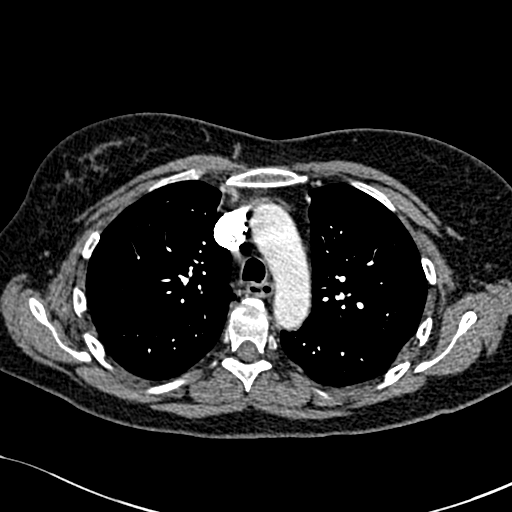
[im 177/253  lung]
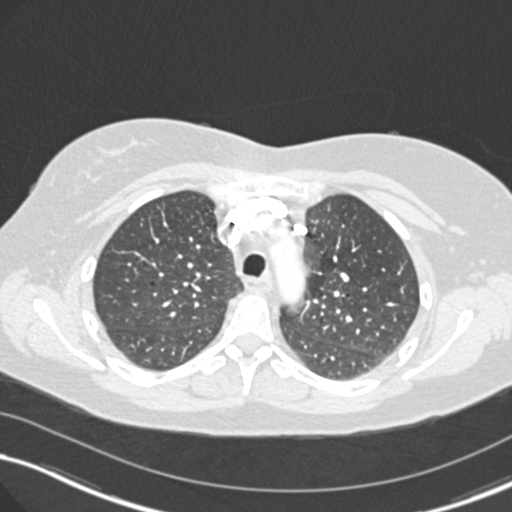
[im 190/253  mediastinal]
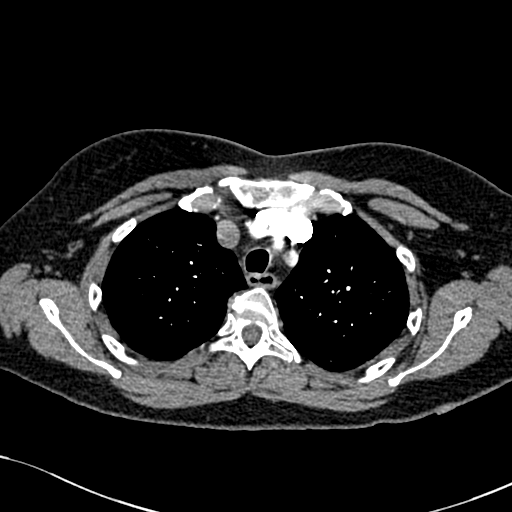
[im 202/253  lung]
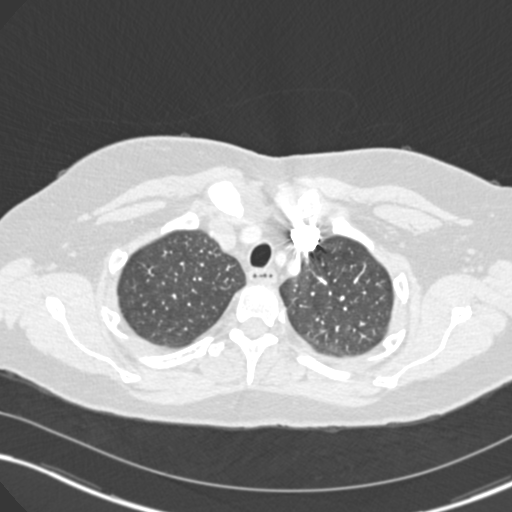
[im 215/253  mediastinal]
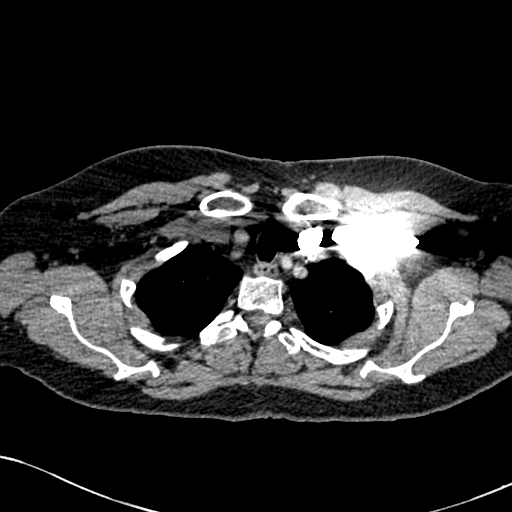
[im 227/253  lung]
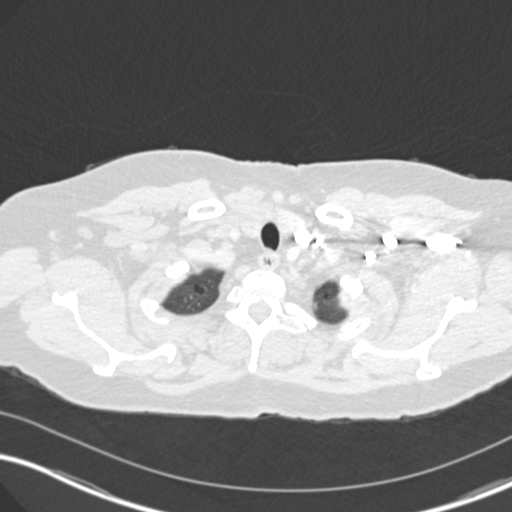
[im 240/253  mediastinal]
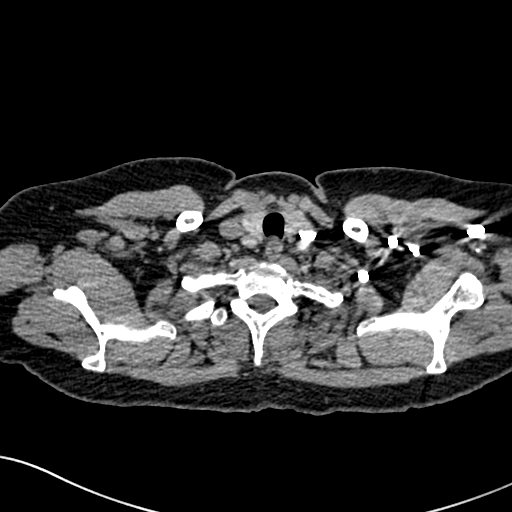

[Series 7: coronal mpr · coronal · 0.52mm/px · 1 of 119 slices shown]
[im 60/119  mediastinal]
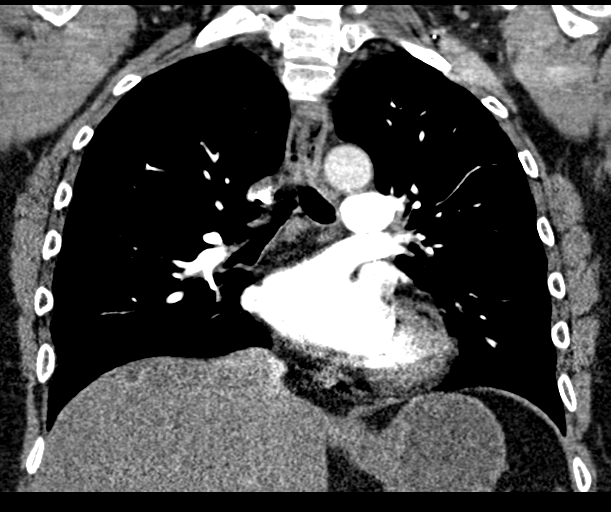

[19 of 36 positions shown; findings below may reference images not displayed]

FINDINGS: Cardiovascular: Satisfactory opacification of the pulmonary arteries
to the segmental level. No evidence of pulmonary embolism. Normal
heart size. No pericardial effusion. Nonaneurysmal aorta. No
dissection seen

Mediastinum/Nodes: No enlarged mediastinal, hilar, or axillary lymph
nodes. Thyroid gland, trachea, and esophagus demonstrate no
significant findings.

Lungs/Pleura: Minimal emphysema. No acute consolidation or effusion.
3 mm pulmonary nodule in the left lower lobe series 6, image number
61.

Upper Abdomen: No acute abnormality.

Musculoskeletal: Degenerative changes. No acute or suspicious
abnormality

Review of the MIP images confirms the above findings.
IMPRESSION: 1. Negative for acute pulmonary embolus.
2. Minimal emphysema
3. 3 mm left lower lobe pulmonary nodule. No follow-up needed if
patient is low-risk. Non-contrast chest CT can be considered in 12
months if patient is high-risk. This recommendation follows the
consensus statement: Guidelines for Management of Incidental
Pulmonary Nodules Detected on CT Images: From the [HOSPITAL]

Emphysema ([email or web address]-[email or web address]).

## 2019-07-26 ENCOUNTER — Telehealth (INDEPENDENT_AMBULATORY_CARE_PROVIDER_SITE_OTHER): Payer: Medicare Other | Admitting: Family Medicine

## 2019-07-26 ENCOUNTER — Other Ambulatory Visit: Payer: Self-pay

## 2019-07-26 DIAGNOSIS — L309 Dermatitis, unspecified: Secondary | ICD-10-CM

## 2019-07-26 NOTE — Telephone Encounter (Signed)
Please review med list in your box and sign and return to me. Thanks!

## 2019-07-26 NOTE — Telephone Encounter (Signed)
This was filled out and placed in the fax pile last week. New form filled out and placed in fax pile again today.

## 2019-07-26 NOTE — Progress Notes (Signed)
Due to technical difficulties, we were not able to have a video visit.  [name] was contacted over the phone.  She reported that she only wanted to let her PCP know that her rash was improving with treatment with the topical steroids we have been using.  She had no other concerns at this time.  She is encouraged to follow-up in the clinic if any new issues arose.

## 2019-08-31 ENCOUNTER — Encounter: Payer: Self-pay | Admitting: Family Medicine

## 2019-08-31 ENCOUNTER — Telehealth (INDEPENDENT_AMBULATORY_CARE_PROVIDER_SITE_OTHER): Payer: Medicare Other | Admitting: Family Medicine

## 2019-08-31 ENCOUNTER — Other Ambulatory Visit: Payer: Self-pay

## 2019-08-31 DIAGNOSIS — Z029 Encounter for administrative examinations, unspecified: Secondary | ICD-10-CM

## 2019-09-01 NOTE — Progress Notes (Signed)
Paperwork discussed with April, Ms. O'Neill's clinical aid.  Paperwork filled out and placed in fax pile.

## 2019-11-01 ENCOUNTER — Other Ambulatory Visit: Payer: Self-pay

## 2019-11-01 ENCOUNTER — Ambulatory Visit (HOSPITAL_COMMUNITY)
Admission: RE | Admit: 2019-11-01 | Discharge: 2019-11-01 | Disposition: A | Discharge status: Home / Self Care | Payer: Medicare Other | Source: Ambulatory Visit | Attending: Family Medicine | Admitting: Family Medicine

## 2019-11-01 ENCOUNTER — Ambulatory Visit (INDEPENDENT_AMBULATORY_CARE_PROVIDER_SITE_OTHER): Payer: Medicare Other | Admitting: Family Medicine

## 2019-11-01 ENCOUNTER — Encounter: Payer: Self-pay | Admitting: Family Medicine

## 2019-11-01 VITALS — BP 100/70 | HR 130 | Ht 66.0 in | Wt 178.1 lb

## 2019-11-01 DIAGNOSIS — R000 Tachycardia, unspecified: Secondary | ICD-10-CM | POA: Insufficient documentation

## 2019-11-01 DIAGNOSIS — R109 Unspecified abdominal pain: Secondary | ICD-10-CM | POA: Diagnosis not present

## 2019-11-01 DIAGNOSIS — E782 Mixed hyperlipidemia: Secondary | ICD-10-CM

## 2019-11-01 DIAGNOSIS — K760 Fatty (change of) liver, not elsewhere classified: Secondary | ICD-10-CM | POA: Diagnosis not present

## 2019-11-01 LAB — POCT URINALYSIS DIP (MANUAL ENTRY)
Bilirubin, UA: NEGATIVE
Blood, UA: NEGATIVE
Glucose, UA: NEGATIVE mg/dL
Ketones, POC UA: NEGATIVE mg/dL
Nitrite, UA: NEGATIVE
Protein Ur, POC: NEGATIVE mg/dL
Spec Grav, UA: 1.015 (ref 1.010–1.025)
Urobilinogen, UA: 0.2 E.U./dL
pH, UA: 7 (ref 5.0–8.0)

## 2019-11-01 LAB — POCT UA - MICROSCOPIC ONLY

## 2019-11-01 NOTE — Patient Instructions (Addendum)
I am glad you feel well today Jean Williams.  I do not know exactly why you are having such a fast heart rate.  The good news is, does not appear to be a problem with the electrical activity of your heart.  There are number of things that can cause a fast heart rate which ranged from anxiety to possible infections.  Going to get some blood test today to see if there are any other abnormalities we should be concerned about.  I will call you to let you know if there are any concerning results from your labs.  I would like you to have your heart rate and blood pressure checked at some point in the next week.  If you have a nurse or medical assistant at your group home, please have them check your blood pressure and heart rate and let me know by the electronic chart or by a phone call to the clinic staff.  We recently diagnosed you with fatty liver disease.  For people with liver issues, it is recommended to make sure to have appropriate antibodies to liver infections.  One of the tests that I arrange today is to look for appropriate immunity against liver infections.  I will let you know if there are any concerning results.

## 2019-11-01 NOTE — Assessment & Plan Note (Signed)
Stable. -Continue Flovent daily and albuterol as needed -Continue Flonase and Claritin as needed

## 2019-11-01 NOTE — Assessment & Plan Note (Signed)
We will draw antibody titers today to see if she would benefit from vaccination for hep A or hep B.

## 2019-11-01 NOTE — Progress Notes (Signed)
    SUBJECTIVE:   CHIEF COMPLAINT / HPI:   [name] came to clinic today to follow-up regarding her chronic diseases and ensure that she did not need any routine screening.  Hyperlipidemia She currently takes atorvastatin 40 mg.  Allergies/asthma She had very little issue with her typical seasonal allergies this year.  Her current medications include Flonase, Flovent, albuterol, Claritin.  Health maintenance She is happy report that she has received both of her covered vaccines.  Tachycardia She did not have any specific complaints upon presentation to clinic today.  A review of her vitals showed that she was tachycardic up to 139 a blood pressure of 100/70.  She reports generally feeling well not having any significant symptoms in the past several days.  Review of systems was only notable for some vaginal malodor.  She denied any recent fever, change in energy, shortness of breath, cough, chest pain, nausea, vomiting, diarrhea, constipation, dysuria, polyuria, vaginal discharge.  She does not feel particularly anxious at this time.  PERTINENT  PMH / PSH: Schizophrenia, panic attacks  OBJECTIVE:   BP 100/70   Pulse (!) 130 Comment: provider informed  Ht 5\' 6"  (1.676 m)   Wt 178 lb 2 oz (80.8 kg)   LMP 06/10/2011   SpO2 96%   BMI 28.75 kg/m    General: Alert and cooperative and appears to be in no acute distress.  Seated comfortably in her exam chair.  No more or less energetic than her typical comportment. HEENT: Moist mucous membranes.  Normal oropharynx.  No significant nasal congestion. Cardio: Normal S1 and S2, no S3 or S4. Rhythm is regular. No murmurs or rubs.   Pulm: Clear to auscultation bilaterally, no crackles, wheezing, or diminished breath sounds. Normal respiratory effort Abdomen: Bowel sounds normal. Abdomen soft and mildly tender to palpation in the suprapubic area.  Extremities: No peripheral edema. Warm/ well perfused.  Strong radial pulse. Neuro: Cranial  nerves grossly intact   ASSESSMENT/PLAN:   Nonalcoholic fatty liver disease We will draw antibody titers today to see if she would benefit from vaccination for hep A or hep B.  Mixed hyperlipidemia Recent lipid panel.  No new lipid panel required today. -Continue current medication  ASTHMA, PERSISTENT Stable. -Continue Flovent daily and albuterol as needed -Continue Flonase and Claritin as needed  UNSPECIFIED TACHYCARDIA This is an acute change.  No obvious etiology in clinic today.  Euvolemic clinical exam, she does not appear dehydrated.  EKG shows sinus tachycardia without any interval/segment abnormalities, she has no history of arrhythmia.  No obvious infectious process based on history and physical exam.  UA does show some leukocytes and physical exam shows some suprapubic tenderness although I am hesitant to diagnose her with a UTI based on this information alone.  We will follow-up urine culture.  She does not appear particularly anxious in clinic today.  We will follow up on TSH as well. -Follow-up CMP, CBC, TSH -Follow-up urine culture, plan to treat if greater than 100,000 CFU's -She agreed to have her blood pressure and heart rate assessed by a CMA at her group home in 1 week and message me the results.  If this tachycardia persists without a clear etiology, may warrant further investigation.  If she returns to normal, further work-up would not be necessary at this time.     [name], MD [place]

## 2019-11-01 NOTE — Assessment & Plan Note (Signed)
Recent lipid panel.  No new lipid panel required today. -Continue current medication

## 2019-11-01 NOTE — Assessment & Plan Note (Signed)
>>  ASSESSMENT AND PLAN FOR TACHYCARDIA WRITTEN ON 11/01/2019  6:31 PM BY DEMPSEY COY, MD  This is an acute change.  No obvious etiology in clinic today.  Euvolemic clinical exam, she does not appear dehydrated.  EKG shows sinus tachycardia without any interval/segment abnormalities, she has no history of arrhythmia.  No obvious infectious process based on history and physical exam.  UA does show some leukocytes and physical exam shows some suprapubic tenderness although I am hesitant to diagnose her with a UTI based on this information alone.  We will follow-up urine culture.  She does not appear particularly anxious in clinic today.  We will follow up on TSH as well. -Follow-up CMP, CBC, TSH -Follow-up urine culture, plan to treat if greater than 100,000 CFU's -She agreed to have her blood pressure and heart rate assessed by a CMA at her group home in 1 week and message me the results.  If this tachycardia persists without a clear etiology, may warrant further investigation.  If she returns to normal, further work-up would not be necessary at this time.

## 2019-11-01 NOTE — Assessment & Plan Note (Signed)
This is an acute change.  No obvious etiology in clinic today.  Euvolemic clinical exam, she does not appear dehydrated.  EKG shows sinus tachycardia without any interval/segment abnormalities, she has no history of arrhythmia.  No obvious infectious process based on history and physical exam.  UA does show some leukocytes and physical exam shows some suprapubic tenderness although I am hesitant to diagnose her with a UTI based on this information alone.  We will follow-up urine culture.  She does not appear particularly anxious in clinic today.  We will follow up on TSH as well. -Follow-up CMP, CBC, TSH -Follow-up urine culture, plan to treat if greater than 100,000 CFU's -She agreed to have her blood pressure and heart rate assessed by a CMA at her group home in 1 week and message me the results.  If this tachycardia persists without a clear etiology, may warrant further investigation.  If she returns to normal, further work-up would not be necessary at this time.

## 2019-11-02 LAB — CBC
Hematocrit: 45.2 % (ref 34.0–46.6)
Hemoglobin: 15.4 g/dL (ref 11.1–15.9)
MCH: 31 pg (ref 26.6–33.0)
MCHC: 34.1 g/dL (ref 31.5–35.7)
MCV: 91 fL (ref 79–97)
Platelets: 299 10*3/uL (ref 150–450)
RBC: 4.96 x10E6/uL (ref 3.77–5.28)
RDW: 13.5 % (ref 11.7–15.4)
WBC: 8.8 10*3/uL (ref 3.4–10.8)

## 2019-11-02 LAB — TSH: TSH: 1.35 u[IU]/mL (ref 0.450–4.500)

## 2019-11-02 LAB — COMPREHENSIVE METABOLIC PANEL
ALT: 24 IU/L (ref 0–32)
AST: 18 IU/L (ref 0–40)
Albumin/Globulin Ratio: 2.2 (ref 1.2–2.2)
Albumin: 4.6 g/dL (ref 3.8–4.9)
Alkaline Phosphatase: 118 IU/L — ABNORMAL HIGH (ref 39–117)
BUN/Creatinine Ratio: 15 (ref 9–23)
BUN: 12 mg/dL (ref 6–24)
Bilirubin Total: 0.5 mg/dL (ref 0.0–1.2)
CO2: 24 mmol/L (ref 20–29)
Calcium: 9.8 mg/dL (ref 8.7–10.2)
Chloride: 104 mmol/L (ref 96–106)
Creatinine, Ser: 0.82 mg/dL (ref 0.57–1.00)
GFR calc Af Amer: 91 mL/min/{1.73_m2} (ref >59)
GFR calc non Af Amer: 79 mL/min/{1.73_m2} (ref >59)
Globulin, Total: 2.1 g/dL (ref 1.5–4.5)
Glucose: 83 mg/dL (ref 65–99)
Potassium: 4.4 mmol/L (ref 3.5–5.2)
Sodium: 141 mmol/L (ref 134–144)
Total Protein: 6.7 g/dL (ref 6.0–8.5)

## 2019-11-02 LAB — HBSAB QUANT HBIG ASSESSMENT: HBsAb Quant HBIG Assessment: 3.7 m[IU]/mL

## 2019-11-02 NOTE — Addendum Note (Signed)
Addended by: Grant Ruts on: 11/02/2019 03:18 PM   Modules accepted: Orders

## 2019-11-03 LAB — FERRITIN: Ferritin: 117 ng/mL (ref 15–150)

## 2019-11-03 LAB — URINE CULTURE

## 2019-11-07 ENCOUNTER — Encounter: Payer: Self-pay | Admitting: Family Medicine

## 2019-11-07 ENCOUNTER — Other Ambulatory Visit: Payer: Self-pay

## 2019-11-07 ENCOUNTER — Ambulatory Visit (INDEPENDENT_AMBULATORY_CARE_PROVIDER_SITE_OTHER): Payer: Medicare Other | Admitting: Family Medicine

## 2019-11-07 DIAGNOSIS — K760 Fatty (change of) liver, not elsewhere classified: Secondary | ICD-10-CM

## 2019-11-07 DIAGNOSIS — R000 Tachycardia, unspecified: Secondary | ICD-10-CM

## 2019-11-07 NOTE — Assessment & Plan Note (Signed)
PHQ-9 score of 15 today.  No SI/HI. -Encouraged to follow-up closely with her psychiatrist for therapy and medication management.

## 2019-11-07 NOTE — Assessment & Plan Note (Signed)
>>  ASSESSMENT AND PLAN FOR TACHYCARDIA WRITTEN ON 11/07/2019  2:43 PM BY DEMPSEY COY, MD  Resolved.  No further work-up at this time.  If this recurs, would consider sending her to cardiology for a Holter monitor.

## 2019-11-07 NOTE — Assessment & Plan Note (Signed)
We spent some time today discussing the diagnosis of nonalcoholic fatty liver disease. -Advised regular exercise -Provided nutrition guidance and the NIH handout created by the [facility] of diabetes and digestive and kidney disease. -Advise receiving the hep B vaccine based on low titers from last visit.  She reported that she wanted to think more about the vaccine before receiving it.

## 2019-11-07 NOTE — Assessment & Plan Note (Signed)
Resolved.  No further work-up at this time.  If this recurs, would consider sending her to cardiology for a Holter monitor.

## 2019-11-07 NOTE — Patient Instructions (Signed)
It was good to see you today [name].  I am glad that your fast heart rate seems to have gotten better.  Please share the information about diet and exercise with April.  I think this information will be helpful for preventing any additional liver damage.  Please consider getting the hepatitis B injection.  I think this is important because it will help protect your liver in case you get exposed to hepatitis B virus.  Please let me know if April requires any additional forms to be filled out.

## 2019-11-07 NOTE — Progress Notes (Signed)
    SUBJECTIVE:   CHIEF COMPLAINT / HPI:   Tachycardia, resolved Jean Williams has come to clinic to follow-up from asymptomatic tachycardia about 1 week ago.  She has not noticed any chest pain or palpitations and has measured her vitals several times throughout the week.  During 1 measurement her heart rate was 77 during a measurement earlier today was 113.  In clinic today, her heart rate is 95 bpm and she denies chest pain, palpitations, shortness of breath.  NAFLD She was recently diagnosed with nonalcoholic fatty liver disease.  And has not yet had a discussion about what that means and how to treat it.  Anxiety/depression/schizophrenia Today, she notes that her anxiety has generally been worsening.  She scored a 15 on the PHQ-9 today with a 0 for question 9.  Overall, she does feel more anxious at times and sometimes feels her heart racing with decisions as simple as crossing the street.  Currently on Paxil and clozapine.  PERTINENT  PMH / PSH: Schizophrenia with long-term use of antipsychotics.  OBJECTIVE:   BP 120/80   Pulse 95   Wt 175 lb (79.4 kg)   LMP 06/10/2011   SpO2 97%   BMI 28.25 kg/m    General: Well-appearing woman.  No acute distress. Respiratory: Breathing comfortably on room air.  Normal respiratory effort. Skin: Warm, dry Mood: Baseline.  Tangential at times.  Mildly anxious.  ASSESSMENT/PLAN:   Nonalcoholic fatty liver disease We spent some time today discussing the diagnosis of nonalcoholic fatty liver disease. -Advised regular exercise -Provided nutrition guidance and the NIH handout created by the [facility] of diabetes and digestive and kidney disease. -Advise receiving the hep B vaccine based on low titers from last visit.  She reported that she wanted to think more about the vaccine before receiving it.  MAJOR DPRSV DISORDER RECURRENT EPISODE UNSPEC PHQ-9 score of 15 today.  No SI/HI. -Encouraged to follow-up closely with her psychiatrist  for therapy and medication management.  UNSPECIFIED TACHYCARDIA Resolved.  No further work-up at this time.  If this recurs, would consider sending her to cardiology for a Holter monitor.     [name], MD [place]

## 2019-11-09 ENCOUNTER — Telehealth: Payer: Self-pay

## 2019-11-09 NOTE — Telephone Encounter (Signed)
Jean Williams calls nurse line stating she never received the 'care plan" form back from [place] after Mondays visit. Do you happen to still have this? This form was to be signed and sent back with Jean Williams. If not, Jean Williams can drop off another one.

## 2019-11-09 NOTE — Telephone Encounter (Signed)
I signed and returned to the orders that [name] brought with her to clinic.  I was under the impression that I had completed all the paperwork that April needed.  If there is any additional paperwork, I do not have it.  [name], MD

## 2019-11-09 NOTE — Telephone Encounter (Signed)
Attempted to contact April to advise to drop off any additional paperwork. However, the phone just rang and rang and no option for VM. If she calls back please give her this message.

## 2019-11-29 ENCOUNTER — Other Ambulatory Visit: Payer: Self-pay

## 2019-11-29 ENCOUNTER — Ambulatory Visit (INDEPENDENT_AMBULATORY_CARE_PROVIDER_SITE_OTHER): Payer: Medicare Other | Admitting: Family Medicine

## 2019-11-29 VITALS — BP 120/80 | HR 110 | Wt 174.8 lb

## 2019-11-29 DIAGNOSIS — R630 Anorexia: Secondary | ICD-10-CM | POA: Diagnosis not present

## 2019-11-29 DIAGNOSIS — N898 Other specified noninflammatory disorders of vagina: Secondary | ICD-10-CM | POA: Diagnosis present

## 2019-11-29 NOTE — Progress Notes (Signed)
    SUBJECTIVE:   CHIEF COMPLAINT / HPI:   Loss of Appetite Acute. Patient states for the past 3 days she has not been hungry for food. She is still able to drink and is thirsty. She is drinking "a lot of water". She does not feel she is drinking more than usual. She denies abdominal pain, nausea, vomiting, difficulty swallowing, early satiety, constipation, or diarrhea. No blood in the stool. Her weight has been stable for the past 2 years per chart review. No fever, chills, or other systemic symptoms.  PERTINENT  PMH / PSH: Hypotension, asthma, Major DDD, Schizophrenia  OBJECTIVE:   BP 120/80   Pulse (!) 110   Wt 174 lb 12.8 oz (79.3 kg)   LMP 06/10/2011   SpO2 95%   BMI 28.21 kg/m   Gen: NAD Abd: soft, non-tender, +bs in all four quadrants, no guarding, no rebound tenderness  ASSESSMENT/PLAN:   Loss of appetite Unclear etiology but favor psychogenic. With no symptoms to suggest any GI cause such as esophageal reflux, intestinal blockage, no signs or symptoms of gallbladder dysfunction or pancreatitis. Reassuring she is eating some foods just not a lot. - Instructed patient to start with more bland foods and then reintroduce back normal foods and schedule meals. If this continues for more than 2 weeks return to clinic.     Nuala Alpha, [place]

## 2019-11-29 NOTE — Patient Instructions (Signed)
It was great to meet you today! Thank you for letting me participate in your care!  Today, we discussed your symptoms of loss of appetite. Your history and exam are reassuring that no kind of infection or blockage is causing your symptoms which I think are stress related. Please try to find a way to relieve the stress in your life and your symptoms should improve.  Be well, Harolyn Rutherford, DO PGY-3, [facility]

## 2019-12-02 DIAGNOSIS — R630 Anorexia: Secondary | ICD-10-CM | POA: Insufficient documentation

## 2019-12-02 NOTE — Assessment & Plan Note (Signed)
Unclear etiology but favor psychogenic. With no symptoms to suggest any GI cause such as esophageal reflux, intestinal blockage, no signs or symptoms of gallbladder dysfunction or pancreatitis. Reassuring she is eating some foods just not a lot. - Instructed patient to start with more bland foods and then reintroduce back normal foods and schedule meals. If this continues for more than 2 weeks return to clinic.

## 2020-02-20 ENCOUNTER — Ambulatory Visit: Payer: Medicare Other | Admitting: Family Medicine

## 2020-03-02 ENCOUNTER — Other Ambulatory Visit: Payer: Self-pay

## 2020-03-02 ENCOUNTER — Encounter: Payer: Self-pay | Admitting: Family Medicine

## 2020-03-02 ENCOUNTER — Telehealth: Payer: Self-pay

## 2020-03-02 ENCOUNTER — Ambulatory Visit (INDEPENDENT_AMBULATORY_CARE_PROVIDER_SITE_OTHER): Payer: Medicare Other | Admitting: Family Medicine

## 2020-03-02 VITALS — BP 110/80 | HR 119 | Ht 66.0 in | Wt 175.0 lb

## 2020-03-02 DIAGNOSIS — R008 Other abnormalities of heart beat: Secondary | ICD-10-CM

## 2020-03-02 DIAGNOSIS — L821 Other seborrheic keratosis: Secondary | ICD-10-CM | POA: Diagnosis not present

## 2020-03-02 DIAGNOSIS — Z23 Encounter for immunization: Secondary | ICD-10-CM | POA: Diagnosis present

## 2020-03-02 DIAGNOSIS — K760 Fatty (change of) liver, not elsewhere classified: Secondary | ICD-10-CM | POA: Diagnosis not present

## 2020-03-02 DIAGNOSIS — F17200 Nicotine dependence, unspecified, uncomplicated: Secondary | ICD-10-CM

## 2020-03-02 DIAGNOSIS — R000 Tachycardia, unspecified: Secondary | ICD-10-CM | POA: Diagnosis not present

## 2020-03-02 NOTE — Assessment & Plan Note (Signed)
-  Not interested in additional help today -Lung cancer screening annually, next screening coming up 05/2020

## 2020-03-02 NOTE — Patient Instructions (Signed)
Seborrheic keratosis: The mole on your back is something called seborrheic keratosis.  This is not dangerous.  If you do notice that it is changing, bleeding getting bigger or darker, please come back.  If you find it irritating, we can remove it.  Fatty liver disease: In order to protect your liver, were going to give you another vaccine against liver infections.  This is the hepatitis B vaccine.  Smoking: Encourage you to continue to consider quitting smoking.  We do need to remember to do lung cancer screening annual once yearly.  Will need another screen this winter.

## 2020-03-02 NOTE — Assessment & Plan Note (Signed)
-  Hepatitis B vaccine administered today

## 2020-03-02 NOTE — Progress Notes (Signed)
    SUBJECTIVE:   CHIEF COMPLAINT / HPI:    Seborrheic keratosis She is noted a growth that has been on her left upper back for years.  It does not particularly bother her although it does sometimes get irritated by her bra.  Her caretaker recently encouraged her to have it assessed by physician to make sure is not dangerous.  Fatty liver disease In the past year, she was found to have fatty liver disease.  At a previous visit, she is also found to have low hepatitis B titers.  She is interested in getting a hepatitis B vaccination at this visit.  Current smoker She has been smoking for over 30 years and is trying to cut down although is not interested in additional help at this time.  Tachycardia She has previously been worked up for tachycardia this past April without any significant abnormalities.  At her last follow-up visit, this appeared to have resolved.  Today, she notes no chest pain or shortness of breath.  She overall feels well.  Following visit, her caretaker, April, was called to discuss any excessive use of albuterol.  April denies she has been using albuterol frequently.  PERTINENT  PMH / PSH: History of basal cell carcinoma, schizophrenia  OBJECTIVE:   BP 110/80   Pulse (!) 119   Ht 5\' 6"  (1.676 m)   Wt 175 lb (79.4 kg)   LMP 06/10/2011   SpO2 93%   BMI 28.25 kg/m    General: Resting comfortably in the exam chair today.  No acute distress. Cardiac: Mildly tachycardic.  No murmurs/rubs/gallops. Respiratory: Mild wheezing noted bilaterally.  No respiratory distress.  Breathing comfortably on room air. Media Information   Document Information  Photos    03/02/2020 08:56  Attached To:  Office Visit on 03/02/20 with [name], MD  Source Information  [name], MD  [facility]     ASSESSMENT/PLAN:   Seborrheic keratosis She was informed this is a benign lesion there can be excised if it is bothersome.  She declined excision today.  She  is encouraged to return to clinic if she noted significant changes or bleeding.  Nonalcoholic fatty liver disease -Hepatitis B vaccine administered today  TOBACCO DEPENDENCE -Not interested in additional help today -Lung cancer screening annually, next screening coming up 05/2020  Tachycardia Work-up this past April showed unremarkable blood work and a normal EKG.  This does not seem to be a medication side effect.  It is possible that this is exertional tachycardia although it does seem to be persistent.  At this time, we will work-up with an echocardiogram to ensure there is no evidence of a tachycardia induced cardiomyopathy.  I will also referral to cardiology for a Holter monitor.     [name], MD [place]

## 2020-03-02 NOTE — Assessment & Plan Note (Signed)
>>  ASSESSMENT AND PLAN FOR TACHYCARDIA WRITTEN ON 03/02/2020  9:46 AM BY DEMPSEY COY, MD  Work-up this past April showed unremarkable blood work and a normal EKG.  This does not seem to be a medication side effect.  It is possible that this is exertional tachycardia although it does seem to be persistent.  At this time, we will work-up with an echocardiogram to ensure there is no evidence of a tachycardia induced cardiomyopathy.  I will also referral to cardiology for a Holter monitor.

## 2020-03-02 NOTE — Telephone Encounter (Signed)
Spoke with Jean Williams and informed her of Ms. Kathalene Frames appt at [facility] for her ECHO on Tue. Aug. 31st at 11:15am. Patient is to go to entrance C. Caregiver Jean Williams understood.  Salvatore Marvel, CMA

## 2020-03-02 NOTE — Assessment & Plan Note (Signed)
She was informed this is a benign lesion there can be excised if it is bothersome.  She declined excision today.  She is encouraged to return to clinic if she noted significant changes or bleeding.

## 2020-03-02 NOTE — Assessment & Plan Note (Signed)
Work-up this past April showed unremarkable blood work and a normal EKG.  This does not seem to be a medication side effect.  It is possible that this is exertional tachycardia although it does seem to be persistent.  At this time, we will work-up with an echocardiogram to ensure there is no evidence of a tachycardia induced cardiomyopathy.  I will also referral to cardiology for a Holter monitor.

## 2020-03-06 ENCOUNTER — Other Ambulatory Visit: Payer: Self-pay

## 2020-03-06 ENCOUNTER — Ambulatory Visit (HOSPITAL_COMMUNITY)
Admission: RE | Admit: 2020-03-06 | Discharge: 2020-03-06 | Disposition: A | Discharge status: Home / Self Care | Payer: Medicare Other | Source: Ambulatory Visit | Attending: Family Medicine | Admitting: Family Medicine

## 2020-03-06 DIAGNOSIS — I351 Nonrheumatic aortic (valve) insufficiency: Secondary | ICD-10-CM | POA: Diagnosis not present

## 2020-03-06 DIAGNOSIS — R008 Other abnormalities of heart beat: Secondary | ICD-10-CM | POA: Diagnosis not present

## 2020-03-06 DIAGNOSIS — R000 Tachycardia, unspecified: Secondary | ICD-10-CM | POA: Insufficient documentation

## 2020-03-06 DIAGNOSIS — E785 Hyperlipidemia, unspecified: Secondary | ICD-10-CM | POA: Diagnosis not present

## 2020-03-06 DIAGNOSIS — F17200 Nicotine dependence, unspecified, uncomplicated: Secondary | ICD-10-CM | POA: Diagnosis not present

## 2020-03-06 LAB — ECHOCARDIOGRAM COMPLETE: S' Lateral: 2.5 cm

## 2020-03-06 NOTE — Progress Notes (Signed)
  Echocardiogram 2D Echocardiogram has been performed.  Jannett Celestine 03/06/2020, 12:11 PM

## 2020-04-15 NOTE — Progress Notes (Signed)
Cardiology Office Note:    Date:  04/17/2020   ID:  Jean Williams, Jean Williams 1961-04-10, MRN [id number]  PCP:  [name], MD  [facility] Cardiologist:  No primary care provider on file.  [facility] Electrophysiologist:  None   Referring MD: Zenia Resides, MD    History of Present Illness:    Jean Williams is a 59 y.o. female with a hx of tobacco use, schizophrenia, fatty liver disease and sinus tachycardia who was referred by Dr. Andria Frames for evaluation of sinus tachycardia.  Patient states that she has been having fast heart rates and feels unsteady on her feet. Has had some falls but no LOC.  Very tangential in her speech and difficult to get a full history. She states that she was unable to walk because "her mind was playing tricks on her." She keeps stating that she "just wants to be well" but does not want "take away her medicine." She admits to drinking a lot soda during the day and smoking at least 20 cigarettes. She states that her "sodas and cigarettes make her happy" and she "does not want anybody to take these away from her." She does not drink much water as she says this disrupts her electrolytes. Denies chest pain,  SOB,  LE edema, syncope.   CTA 2019: Negative for PE TTE 02/2020: Normal BiV function; no significant valvular abnormalities  Labs:  TC 136, HDL 33, TG 352, LDL 50  Past Medical History:  Diagnosis Date  . Chronic constipation   . Eczema   . Emphysema of lung ([place])    in epic CT angio Chest-- 01-24-2018  . GAD (generalized anxiety disorder)   . GERD (gastroesophageal reflux disease)   . History of adenomatous polyp of colon   . History of basal cell carcinoma (BCC) excision    05-13-2011   s/p moh's left upper lip  . History of panic attacks   . Hyperlipidemia   . Psoriasis   . Rectal polyp   . Schizophrenia ([place])    followed by [facility] , dr Andree Elk  . Seasonal allergic rhinitis   . Tachycardia    followed by pcp, first dx 2015,  no meds for this,  last EKG in epic dated 02-09-2018    Past Surgical History:  Procedure Laterality Date  . COLONOSCOPY    . TRANSANAL EXCISION OF RECTAL MASS N/A 03/17/2019   Procedure: TRANSANAL EXCISION OF RECTAL POLYP;  Surgeon: Leighton Ruff, MD;  Location: [place];  Service: General;  Laterality: N/A;    Current Medications: Current Meds  Medication Sig  . acetaminophen (TYLENOL) 325 MG tablet Take 2 tablets (650 mg total) by mouth every 6 (six) hours as needed.  Marland Kitchen albuterol (PROVENTIL HFA;VENTOLIN HFA) 108 (90 Base) MCG/ACT inhaler Inhale 1-2 puffs into the lungs every 6 (six) hours as needed for wheezing or shortness of breath.  Marland Kitchen atorvastatin (LIPITOR) 40 MG tablet Take 1 tablet (40 mg total) by mouth at bedtime. (Patient taking differently: Take 40 mg by mouth at bedtime. )  . carboxymethylcellul-glycerin (OPTIVE) 0.5-0.9 % ophthalmic solution Place 1 drop into both eyes 4 (four) times daily. Cannot self-administer (Patient taking differently: Place 1 drop into both eyes 4 (four) times daily. Cannot self-administer)  . cloZAPine (CLOZARIL) 100 MG tablet Take 1 tablet (100 mg total) by mouth 2 (two) times daily. Take 1/2 tab every am & 3 tabs every evening (Patient taking differently: Take 100 mg by mouth 3 (three)  times daily. )  . docusate sodium (COLACE) 100 MG capsule Take 100 mg by mouth 2 (two) times daily.  . famotidine (PEPCID) 20 MG tablet Take 20 mg by mouth 2 (two) times daily.  . fluticasone (FLONASE) 50 MCG/ACT nasal spray INAHALE 2 SPRAYS INTO EACH NOSTRIL ONCE DAILY. (Patient taking differently: Place 2 sprays into both nostrils daily. INAHALE 2 SPRAYS INTO EACH NOSTRIL ONCE DAILY.)  . fluticasone (FLOVENT HFA) 110 MCG/ACT inhaler INHALE 1 PUFF BY MOUTH TWICE DAILY. RINSE MOUTH AFTER USE. (Patient taking differently: Inhale 1 puff into the lungs 2 (two) times daily. INHALE 1 PUFF BY MOUTH TWICE DAILY. RINSE MOUTH AFTER USE.)  . loratadine (CLARITIN)  10 MG tablet Take 1 tablet (10 mg total) by mouth daily.  . Multiple Vitamins-Minerals (CERTAVITE/ANTIOXIDANTS) TABS TAKE 1 TABLET BY MOUTH ONCE DAILY. REPLACES [place] (Patient taking differently: Take 1 tablet by mouth daily. )  . Oyster Shell 500 MG TABS Take 1 tablet by mouth 2 (two) times daily.  Marland Kitchen PARoxetine (PAXIL) 30 MG tablet TAKE 1 TABLET BY MOUTH IN THE MORNING. (Patient taking differently: Take 30 mg by mouth daily. )  . Polyethylene Glycol 3350 (MIRALAX PO) Take by mouth daily.  Marland Kitchen triamcinolone (KENALOG) 0.025 % ointment Apply 1 application topically 2 (two) times daily as needed.  . triamcinolone ointment (KENALOG) 0.1 % Apply 1 application topically 2 (two) times daily.     Allergies:   Penicillins   Social History   Socioeconomic History  . Marital status: Single    Spouse name: Not on file  . Number of children: Not on file  . Years of education: Not on file  . Highest education level: Not on file  Occupational History  . Not on file  Tobacco Use  . Smoking status: Current Every Day Smoker    Years: 40.00    Types: Cigarettes  . Smokeless tobacco: Never Used  . Tobacco comment: down to 6 cig. per day from 1ppd  Vaping Use  . Vaping Use: Never used  Substance and Sexual Activity  . Alcohol use: No    Alcohol/week: 0.0 standard drinks  . Drug use: Not Currently    Comment: yrs ago  . Sexual activity: Not on file  Other Topics Concern  . Not on file  Social History Narrative  . Not on file   Social Determinants of Health   Financial Resource Strain:   . Difficulty of Paying Living Expenses: Not on file  Food Insecurity:   . Worried About Charity fundraiser in the Last Year: Not on file  . Ran Out of Food in the Last Year: Not on file  Transportation Needs:   . Lack of Transportation (Medical): Not on file  . Lack of Transportation (Non-Medical): Not on file  Physical Activity:   . Days of Exercise per Week: Not on file  . Minutes of Exercise per  Session: Not on file  Stress:   . Feeling of Stress : Not on file  Social Connections:   . Frequency of Communication with Friends and Family: Not on file  . Frequency of Social Gatherings with Friends and Family: Not on file  . Attends Religious Services: Not on file  . Active Member of Clubs or Organizations: Not on file  . Attends Archivist Meetings: Not on file  . Marital Status: Not on file     Family History: The patient's family history includes Breast cancer in her maternal grandmother and sister; Cancer  in her maternal grandmother and sister; Heart failure in an other family member; Stroke in an other family member. There is no history of Colon cancer, Colon polyps, Rectal cancer, Stomach cancer, or Esophageal cancer.  ROS:   Please see the history of present illness.     The patient denies chest pain, chest pressure, dyspnea at rest or with exertion, palpitations, PND, orthopnea, or leg swelling. Denies cough, fever, chills. Denies nausea, vomiting. Denies syncope or presyncope.   EKGs/Labs/Other Studies Reviewed:    The following studies were reviewed today: TTE March 04, 2020: IMPRESSIONS  1. Left ventricular ejection fraction, by estimation, is 55 to 60%. The  left ventricle has normal function. The left ventricle has no regional  wall motion abnormalities. There is mild asymmetric left ventricular  hypertrophy of the basal and septal  segments. Left ventricular diastolic parameters are indeterminate.  2. Right ventricular systolic function is normal. The right ventricular  size is normal.  3. The mitral valve is normal in structure. Trivial mitral valve  regurgitation. No evidence of mitral stenosis.  4. The aortic valve is normal in structure. Aortic valve regurgitation is  trivial. No aortic stenosis is present.  5. The inferior vena cava is normal in size with greater than 50%  respiratory variability, suggesting right atrial pressure of 3 mmHg.   CTA  01/2018: IMPRESSION: 1. Negative for acute pulmonary embolus. 2. Minimal emphysema 3. 3 mm left lower lobe pulmonary nodule. No follow-up needed if patient is low-risk. Non-contrast chest CT can be considered in 12 months if patient is high-risk. This recommendation follows the consensus statement: Guidelines for Management of Incidental Pulmonary Nodules Detected on CT Images: From the Fleischner Society 2017; Radiology 2017; 284:228-243  CT Chest 2020: Cardiovascular: Scattered aortic atherosclerosis. Normal heart size. LAD coronary artery calcifications. No pericardial effusion. IMPRESSION: 1. Stable, benign 3 mm pulmonary nodule of the left lower lobe (series 3, image 61). No further routine CT follow-up is required for this nodule.  2. Mild, diffuse bilateral bronchial wall thickening, most notable in the lower lobe bronchi bilaterally and similar to prior examination. Findings are consistent with nonspecific infectious or inflammatory bronchitis.  3. There is increased paramedian consolidation of the medial right upper lobe with proximal bronchial wall thickening and plugging (series 3, image 71). Findings are consistent with atypical infection, particularly atypical mycobacterium with this location. There is no significant pulmonary nodularity.  4. Ill-defined, irregular hypodensity of the liver parenchyma (series 2, image 120). This appearance is of uncertain significance, and may reflect perfusion abnormality or uneven hepatic steatosis, however mass or metastatic disease is not excluded, particularly given that this appearance is new in comparison to CT examination dated 11/10/2017. Recommend multiphasic contrast enhanced examination of the abdomen and pelvis to further evaluate.  5.  Coronary artery disease.  Aortic Atherosclerosis (ICD10-I70.0).  EKG:  EKG is ordered today.  The ekg ordered today demonstrates sinus tachycardia. No block. No ischemia.  Recent  Labs: 11/01/2019: ALT 24; BUN 12; Creatinine, Ser 0.82; Hemoglobin 15.4; Platelets 299; Potassium 4.4; Sodium 141; TSH 1.350  Recent Lipid Panel    Component Value Date/Time   CHOL 136 04/26/2019 1428   TRIG 352 (H) 04/26/2019 1428   HDL 33 (L) 04/26/2019 1428   CHOLHDL 4.1 04/26/2019 1428   CHOLHDL 5.1 05/29/2014 1709   VLDL NOT CALC 05/29/2014 1709   LDLCALC 50 04/26/2019 1428      Physical Exam:    VS:  LMP 06/10/2011     Wt Readings from  Last 3 Encounters:  04/17/20 175 lb (79.4 kg)  03/02/20 175 lb (79.4 kg)  11/29/19 174 lb 12.8 oz (79.3 kg)     GEN: Elderly female, fidgeting throughout examination, tangential thought process HEENT: Pin-point pupils NECK: No JVD; No carotid bruits LYMPHATICS: No lymphadenopathy CARDIAC: Tachycardic, regular, no murmurs, rubs, gallops RESPIRATORY:  Clear to auscultation without rales, wheezing or rhonchi  ABDOMEN: Soft, non-tender, non-distended MUSCULOSKELETAL:  No edema; No deformity  SKIN: Warm and dry NEUROLOGIC:  Alert and oriented x 3 PSYCHIATRIC:  Tangential in thought process  ASSESSMENT:    1. Tachycardia   2. Mixed hyperlipidemia   3. TOBACCO DEPENDENCE    PLAN:    In order of problems listed above:  #Sinus tachycardia: Suspect secondary to significant caffeine intake with her soda, cigarette use and possibly medication effect from home meds. Patient also does not appear to be drinking much water at home. TTE with normal BiV function with no significant valvular disease. CTA in 2019 without PE. Patient denies palpitations, lightheadedness or syncope. Recommended cutting back on caffeine and cigarettes and increasing water intake.  -Cut back on soda and cigarette use as likely primary driver of tachycardia; she really does not want to stop them at this time as it provides her joy. Cutting back will help with the tachycardia. -Increase water intake -TTE reassuring with normal BiV function and no significant valvular  abnormalities -No evidence of PE and CTA in 2019 normal -No BB at this time as not indicated if patient asymptomatic and sinus tachycardia with normal TTE  #Coronary calcification: Noted on CT chest in 2020 with both LAD calcification and aortic calcification. Current tobacco smoker. -Continue atorvastatin 40mg  daily  #HLD: #Elevated TG: -Continue atorvastatin 40mg  daily -Consider vascepa pending repeat lipid panel at next visit  #Tobacco abuse: Patient does not want to quit at this time. Counseled that cutting back will help lower CVD risk as well as help with her tachycardia. She states she will try to cut back. -Continue ongoing counseling about smoking cessation -Patient willing to cut back at this time  #Schizophrenia: Patient very tangential during interview today with difficulty taking history.  -Continue follow-up with primary providers  Medication Adjustments/Labs and Tests Ordered: Current medicines are reviewed at length with the patient today.  Concerns regarding medicines are outlined above.  Orders Placed This Encounter  Procedures  . EKG 12-Lead   No orders of the defined types were placed in this encounter.   Patient Instructions  Medication Instructions:   Your physician recommends that you continue on your current medications as directed. Please refer to the Current Medication list given to you today.  *If you need a refill on your cardiac medications before your next appointment, please call your pharmacy*   Lab Work: [place]   If you have labs (blood work) drawn today and your tests are completely normal, you will receive your results only by: Marland Kitchen MyChart Message (if you have MyChart) OR . A paper copy in the mail If you have any lab test that is abnormal or we need to change your treatment, we will call you to review the results.   Testing/Procedures: NONE ORDERED  TODAY  Follow-Up: At [facility], you and your health needs are our  priority.  As part of our continuing mission to provide you with exceptional heart care, we have created designated Provider Care Teams.  These Care Teams include your primary Cardiologist (physician) and Advanced Practice Providers (APPs -  Physician Assistants and Nurse Practitioners) who all work together to provide you with the care you need, when you need it.  We recommend signing up for the patient portal called "MyChart".  Sign up information is provided on this After Visit Summary.  MyChart is used to connect with patients for Virtual Visits (Telemedicine).  Patients are able to view lab/test results, encounter notes, upcoming appointments, etc.  Non-urgent messages can be sent to your provider as well.   To learn more about what you can do with MyChart, go to [email or web address].    Your next appointment:   3 month(s)  The format for your next appointment:   In Person  Provider:   Gwyndolyn Kaufman, MD   Other Instructions  Keeping your self daily hydrated with enough fluids for day. Cut back over excessive fluid intake. 2 to 3 water bottle worth a day.  For Orthostatic blood pressures. you take your blood pressure lying sitting up and standing two time 3 minutes apart      Signed, Freada Bergeron, MD  04/17/2020 11:58 AM    [place]

## 2020-04-17 ENCOUNTER — Ambulatory Visit (INDEPENDENT_AMBULATORY_CARE_PROVIDER_SITE_OTHER): Payer: Medicare Other | Admitting: Cardiology

## 2020-04-17 ENCOUNTER — Ambulatory Visit (INDEPENDENT_AMBULATORY_CARE_PROVIDER_SITE_OTHER): Payer: Medicare Other | Admitting: Family Medicine

## 2020-04-17 ENCOUNTER — Encounter: Payer: Self-pay | Admitting: Cardiology

## 2020-04-17 ENCOUNTER — Other Ambulatory Visit: Payer: Self-pay

## 2020-04-17 VITALS — BP 118/80 | HR 124 | Ht 66.0 in | Wt 175.0 lb

## 2020-04-17 DIAGNOSIS — Z122 Encounter for screening for malignant neoplasm of respiratory organs: Secondary | ICD-10-CM

## 2020-04-17 DIAGNOSIS — F17200 Nicotine dependence, unspecified, uncomplicated: Secondary | ICD-10-CM | POA: Diagnosis not present

## 2020-04-17 DIAGNOSIS — E782 Mixed hyperlipidemia: Secondary | ICD-10-CM

## 2020-04-17 DIAGNOSIS — Z23 Encounter for immunization: Secondary | ICD-10-CM

## 2020-04-17 DIAGNOSIS — R000 Tachycardia, unspecified: Secondary | ICD-10-CM | POA: Diagnosis not present

## 2020-04-17 DIAGNOSIS — L989 Disorder of the skin and subcutaneous tissue, unspecified: Secondary | ICD-10-CM | POA: Insufficient documentation

## 2020-04-17 DIAGNOSIS — L821 Other seborrheic keratosis: Secondary | ICD-10-CM

## 2020-04-17 DIAGNOSIS — F17219 Nicotine dependence, cigarettes, with unspecified nicotine-induced disorders: Secondary | ICD-10-CM

## 2020-04-17 NOTE — Patient Instructions (Addendum)
It was great to see you today Jean Williams.  The excision went well today.  I am going to send that piece of skin off for pathology to make sure there is nothing dangerous with it.  I do not expect any further problems.  I recommend daily dressing changes with a nonstick gauze that I gave you for the next couple of days.  After that, should be fine to just cover it up the area with a Band-Aid.  I expect it to heal pretty well in the next couple of weeks.  It will not be fully healed for a couple of months.  I do not expect this to come back again.  Lung cancer screening: We will be doing this once a year based on your history of smoking.  We will call you once we have your imaging set up.  Tachycardia: Heart rate seems to be a little bit high today.  I know you are following up with cardiology to see if any additional monitoring is going to be necessary.

## 2020-04-17 NOTE — Patient Instructions (Signed)
Medication Instructions:   Your physician recommends that you continue on your current medications as directed. Please refer to the Current Medication list given to you today.  *If you need a refill on your cardiac medications before your next appointment, please call your pharmacy*   Lab Work: [place]   If you have labs (blood work) drawn today and your tests are completely normal, you will receive your results only by: Marland Kitchen MyChart Message (if you have MyChart) OR . A paper copy in the mail If you have any lab test that is abnormal or we need to change your treatment, we will call you to review the results.   Testing/Procedures: NONE ORDERED  TODAY  Follow-Up: At [facility], you and your health needs are our priority.  As part of our continuing mission to provide you with exceptional heart care, we have created designated Provider Care Teams.  These Care Teams include your primary Cardiologist (physician) and Advanced Practice Providers (APPs -  Physician Assistants and Nurse Practitioners) who all work together to provide you with the care you need, when you need it.  We recommend signing up for the patient portal called "MyChart".  Sign up information is provided on this After Visit Summary.  MyChart is used to connect with patients for Virtual Visits (Telemedicine).  Patients are able to view lab/test results, encounter notes, upcoming appointments, etc.  Non-urgent messages can be sent to your provider as well.   To learn more about what you can do with MyChart, go to [email or web address].    Your next appointment:   3 month(s)  The format for your next appointment:   In Person  Provider:   Gwyndolyn Kaufman, MD   Other Instructions  Keeping your self daily hydrated with enough fluids for day. Cut back over excessive fluid intake. 2 to 3 water bottle worth a day.  For Orthostatic blood pressures. you take your blood pressure lying sitting up and standing  two time 3 minutes apart

## 2020-04-17 NOTE — Progress Notes (Signed)
    SUBJECTIVE:   CHIEF COMPLAINT / HPI:   Seborrheic keratosis She is previously seen for the skin lesion on her left shoulder and was offered excision previously. She is return to clinic today for excision of this skin lesion. She finds that it is frequently irritating and rubs on her clothing.  Current smoker Previous lung cancer screening from 05/2019 showed 1 small 3 mm lesion that did not require follow-up. We plan to continue to do lung cancer screening annually.  Tachycardia She is previously been seen and partially evaluated for tachycardia. She feels well but realizes that her heart is beating quickly today. She does note that she does have some social stressors at home although not significantly more than usual. She overall feels well with no evidence of fever, chills, shortness of breath.  PERTINENT  PMH / PSH: Schizophrenia, asthma, tobacco use  OBJECTIVE:   BP 118/80   Pulse (!) 124   Ht 5\' 6"  (1.676 m)   Wt 175 lb (79.4 kg)   LMP 06/10/2011   SpO2 93%   BMI 28.25 kg/m    General: Alert and cooperative and appears to be in no acute distress Cardio: Normal S1 and S2, no S3 or S4. Regular rhythm, tachycardic.Marland Kitchen No murmurs or rubs.   Pulm: Clear to auscultation bilaterally, no crackles, wheezing, or diminished breath sounds. Normal respiratory effort Skin: Roughly 1.5 cm exophytic lesion on left scapula.    ASSESSMENT/PLAN:   Skin lesion Appearance most consistent with seborrheic keratosis. She was informed this is likely benign pathology but we could remove if it is an irritation. She reports it would like it removed due to the constant snagging on her clothing. The risks and benefits of this procedure were discussed and an informed consent was signed. She was anesthetized with 5 cc of lidocaine with epinephrine and the lesion was excised with a skin blade. The procedure was completed without complication and she was bandaged with a nonadherent gauze. She was sent  home with additional gauze and tape.   She was encouraged to return to clinic for any significant erythema, swelling, drainage.  Tachycardia She continues to be tachycardic up to 120 today. No other concerning signs or symptoms at this time. No evidence of volume overload. Recent echocardiogram demonstrated asymmetric LVH without any other abnormal findings. She is planning to follow-up with cardiology later this month.  TOBACCO DEPENDENCE -Annual low-dose lung CT ordered today     [name], MD [place]

## 2020-04-17 NOTE — Assessment & Plan Note (Signed)
-  Annual low-dose lung CT ordered today

## 2020-04-17 NOTE — Assessment & Plan Note (Signed)
Appearance most consistent with seborrheic keratosis. She was informed this is likely benign pathology but we could remove if it is an irritation. She reports it would like it removed due to the constant snagging on her clothing. The risks and benefits of this procedure were discussed and an informed consent was signed. She was anesthetized with 5 cc of lidocaine with epinephrine and the lesion was excised with a skin blade. The procedure was completed without complication and she was bandaged with a nonadherent gauze. She was sent home with additional gauze and tape.   She was encouraged to return to clinic for any significant erythema, swelling, drainage.

## 2020-04-17 NOTE — Assessment & Plan Note (Signed)
She continues to be tachycardic up to 120 today. No other concerning signs or symptoms at this time. No evidence of volume overload. Recent echocardiogram demonstrated asymmetric LVH without any other abnormal findings. She is planning to follow-up with cardiology later this month.

## 2020-04-17 NOTE — Assessment & Plan Note (Signed)
>>  ASSESSMENT AND PLAN FOR TACHYCARDIA WRITTEN ON 04/17/2020 10:21 AM BY DEMPSEY COY, MD  She continues to be tachycardic up to 120 today. No other concerning signs or symptoms at this time. No evidence of volume overload. Recent echocardiogram demonstrated asymmetric LVH without any other abnormal findings. She is planning to follow-up with cardiology later this month.

## 2020-04-19 ENCOUNTER — Other Ambulatory Visit: Payer: Self-pay | Admitting: Family Medicine

## 2020-04-19 DIAGNOSIS — Z1231 Encounter for screening mammogram for malignant neoplasm of breast: Secondary | ICD-10-CM

## 2020-04-20 ENCOUNTER — Encounter: Payer: Self-pay | Admitting: Family Medicine

## 2020-05-01 ENCOUNTER — Encounter: Payer: Self-pay | Admitting: Family Medicine

## 2020-05-01 ENCOUNTER — Other Ambulatory Visit: Payer: Self-pay

## 2020-05-01 ENCOUNTER — Ambulatory Visit (INDEPENDENT_AMBULATORY_CARE_PROVIDER_SITE_OTHER): Payer: Medicare Other | Admitting: Family Medicine

## 2020-05-01 DIAGNOSIS — L309 Dermatitis, unspecified: Secondary | ICD-10-CM | POA: Diagnosis not present

## 2020-05-01 DIAGNOSIS — R000 Tachycardia, unspecified: Secondary | ICD-10-CM | POA: Diagnosis not present

## 2020-05-01 MED ORDER — BETAMETHASONE VALERATE 0.1 % EX OINT: 1.0000 "application " | TOPICAL_OINTMENT | Freq: Two times a day (BID) | CUTANEOUS | 0 refills | Status: DC

## 2020-05-01 NOTE — Assessment & Plan Note (Addendum)
Lesions looks like severe eczema vs contact dermatitis vs psoriasis. D/C triamcinolone and start Betamethasone instead. F/U in 2 weeks with PCP for reassessment. If no improvement, she will need skin biopsy. Patient seems confused about instruction. I called her care giver (April Thompson) and advised her on the above plan.

## 2020-05-01 NOTE — Progress Notes (Addendum)
    SUBJECTIVE:   CHIEF COMPLAINT / HPI:   Rash This is a chronic problem. Episode onset: rash on her thighs, arms and neck for many years which is recurrent. The problem has been waxing and waning since onset. The rash is characterized by itchiness. She was exposed to nothing (No sick contact). Pertinent negatives include no fever. Treatments tried: Eucerine cream, Triamcinolone 1% ointment. The treatment provided moderate relief.   Tachycardia:She denies chest pain, SOB or palpitations. She stated that she was referred to a cardiologist in the past for her HR and they recommended cutting back on Soda intake.  PERTINENT  PMH / PSH: PMX reviewed  OBJECTIVE:   Vitals:   05/01/20 0850 05/01/20 0900  BP: 120/80   Pulse: (!) 122 (!) 118  SpO2: 96%   Weight: 174 lb 6 oz (79.1 kg)   Height: 5\' 6"  (1.676 m)     Physical Exam Vitals and nursing note reviewed.  Cardiovascular:     Rate and Rhythm: Regular rhythm. Tachycardia present.     Heart sounds: Normal heart sounds. No murmur heard.   Pulmonary:     Effort: Pulmonary effort is normal. No respiratory distress.     Breath sounds: Normal breath sounds. No wheezing.  Abdominal:     General: Bowel sounds are normal. There is no distension.     Palpations: Abdomen is soft. There is no mass.     Tenderness: There is no abdominal tenderness.  Musculoskeletal:     Right lower leg: No edema.     Left lower leg: No edema.  Skin:    Comments: Varied sizes of, hyperpigmented mildly erythematous, dry patches on her thighs anteriorly, her shin and forearms.       ASSESSMENT/PLAN:   Eczema Lesions looks like severe eczema vs contact dermatitis vs psoriasis. D/C triamcinolone and start Betamethasone instead. F/U in 2 weeks with PCP for reassessment. If no improvement, she will need skin biopsy. Patient seems confused about instruction. I called her care giver (April Thompson) and advised her on the above plan.   Tachycardia I  reviewed EKG from last week which showed sinus tachycardia @115  bmp.  I reviewed report from her Cardiologist's office and they indeed recommended cutting back on Soda and no Beta-blockers warranted at this time. Lab review showed recent normal TSH. F/U with PCP for further management. ED precaution discussed.     Andrena Mews, MD [place]

## 2020-05-01 NOTE — Assessment & Plan Note (Signed)
>>  ASSESSMENT AND PLAN FOR TACHYCARDIA WRITTEN ON 05/01/2020  3:26 PM BY ANDERS CUMINS T, MD  I reviewed EKG from last week which showed sinus tachycardia @115  bmp.  I reviewed report from her Cardiologist's office and they indeed recommended cutting back on Soda and no Beta-blockers warranted at this time. Lab review showed recent normal TSH. F/U with PCP for further management. ED precaution discussed.

## 2020-05-01 NOTE — Assessment & Plan Note (Addendum)
I reviewed EKG from last week which showed sinus tachycardia @115  bmp.  I reviewed report from her Cardiologist's office and they indeed recommended cutting back on Soda and no Beta-blockers warranted at this time. Lab review showed recent normal TSH. F/U with PCP for further management. ED precaution discussed.

## 2020-05-01 NOTE — Patient Instructions (Addendum)
It was nice seeing you. I have sent in a new skin medication to your pharmacy. As discussed, your heart rate was elevated. It seems chronic. See PCP soon. Please go to the ED if experiencing chest pain or palpitation  Sinus Tachycardia  Sinus tachycardia is a kind of fast heartbeat. In sinus tachycardia, the heart beats more than 100 times a minute. Sinus tachycardia starts in a part of the heart called the sinus node. Sinus tachycardia may be harmless, or it may be a sign of a serious condition. What are the causes? This condition may be caused by:  Exercise or exertion.  A fever.  Pain.  Loss of body fluids (dehydration).  Severe bleeding (hemorrhage).  Anxiety and stress.  Certain substances, including: ? Alcohol. ? Caffeine. ? Tobacco and nicotine products. ? Cold medicines. ? Illegal drugs.  Medical conditions including: ? Heart disease. ? An infection. ? An overactive thyroid (hyperthyroidism). ? A lack of red blood cells (anemia). What are the signs or symptoms? Symptoms of this condition include:  A feeling that the heart is beating quickly (palpitations).  Suddenly noticing your heartbeat (cardiac awareness).  Dizziness.  Tiredness (fatigue).  Shortness of breath.  Chest pain.  Nausea.  Fainting. How is this diagnosed? This condition is diagnosed with:  A physical exam.  Other tests, such as: ? Blood tests. ? An electrocardiogram (ECG). This test measures the electrical activity of the heart. ? Ambulatory cardiac monitor. This records your heartbeats for 24 hours or more. You may be referred to a heart specialist (cardiologist). How is this treated? Treatment for this condition depends on the cause or the underlying condition. Treatment may involve:  Treating the underlying condition.  Taking new medicines or changing your current medicines as told by your health care provider.  Making changes to your diet or lifestyle. Follow these  instructions at home: Lifestyle   Do not use any products that contain nicotine or tobacco, such as cigarettes and e-cigarettes. If you need help quitting, ask your health care provider.  Do not use illegal drugs, such as cocaine.  Learn relaxation methods to help you when you get stressed or anxious. These include deep breathing.  Avoid caffeine or other stimulants. Alcohol use   Do not drink alcohol if: ? Your health care provider tells you not to drink. ? You are pregnant, may be pregnant, or are planning to become pregnant.  If you drink alcohol, limit how much you have: ? 0-1 drink a day for women. ? 0-2 drinks a day for men.  Be aware of how much alcohol is in your drink. In the [place] one drink equals one typical bottle of beer (12 oz), one-half glass of wine (5 oz), or one shot of hard liquor (1 oz). General instructions  Drink enough fluids to keep your urine pale yellow.  Take over-the-counter and prescription medicines only as told by your health care provider.  Keep all follow-up visits as told by your health care provider. This is important. Contact a health care provider if you have:  A fever.  Vomiting or diarrhea that does not go away. Get help right away if you:  Have pain in your chest, upper arms, jaw, or neck.  Become weak or dizzy.  Feel faint.  Have palpitations that do not go away. Summary  In sinus tachycardia, the heart beats more than 100 times a minute.  Sinus tachycardia may be harmless, or it may be a sign of a serious condition.  Treatment for this condition depends on the cause or the underlying condition.  Get help right away if you have pain in your chest, upper arms, jaw, or neck. This information is not intended to replace advice given to you by your health care provider. Make sure you discuss any questions you have with your health care provider. Document Revised: 08/12/2017 Document Reviewed: 08/12/2017 Elsevier Patient  Education  [place].

## 2020-05-03 ENCOUNTER — Ambulatory Visit
Admission: RE | Admit: 2020-05-03 | Discharge: 2020-05-03 | Disposition: A | Discharge status: Home / Self Care | Payer: Medicare Other | Source: Ambulatory Visit | Attending: Family Medicine | Admitting: Family Medicine

## 2020-05-03 DIAGNOSIS — F17219 Nicotine dependence, cigarettes, with unspecified nicotine-induced disorders: Secondary | ICD-10-CM

## 2020-05-03 DIAGNOSIS — Z122 Encounter for screening for malignant neoplasm of respiratory organs: Secondary | ICD-10-CM

## 2020-05-03 IMAGING — CT CT CHEST LUNG CANCER SCREENING LOW DOSE W/O CM
1 of 3 series · 9 of 40 positions shown, 12 images · non-contrast
Comparison: 05/10/2019 chest CT.

CLINICAL DATA: 58-year-old asymptomatic female current smoker with
50 pack-year smoking history.

EXAM:
CT CHEST WITHOUT CONTRAST LOW-DOSE FOR LUNG CANCER SCREENING
TECHNIQUE: Multidetector CT imaging of the chest was performed following the
standard protocol without IV contrast.

[ct lung segmentation data · axial · 0.65mm/px · z∈[+356,+356]mm · 9 of 282 frames shown]
[frame 1/282  mediastinal]
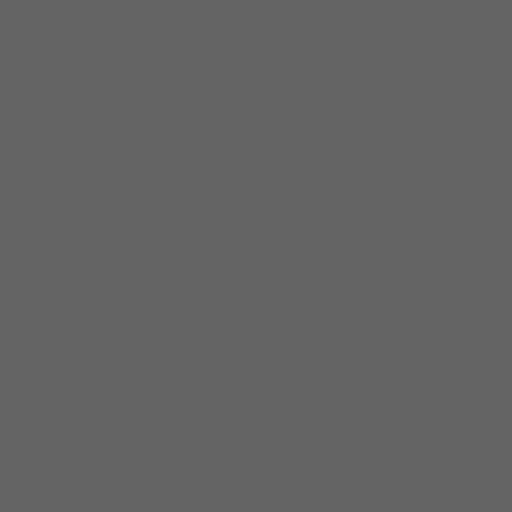
[frame 1/282  lung]
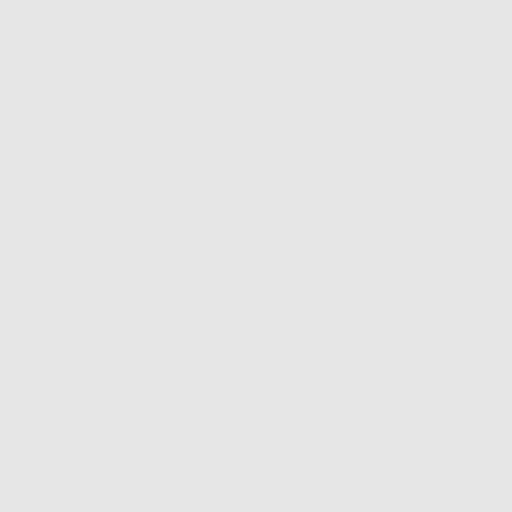
[frame 32/282  lung]
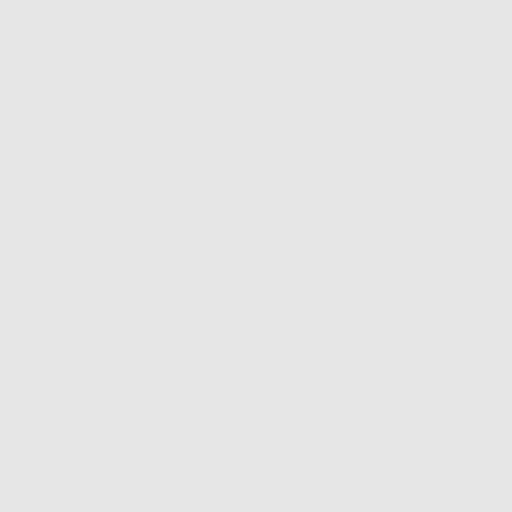
[frame 63/282  lung]
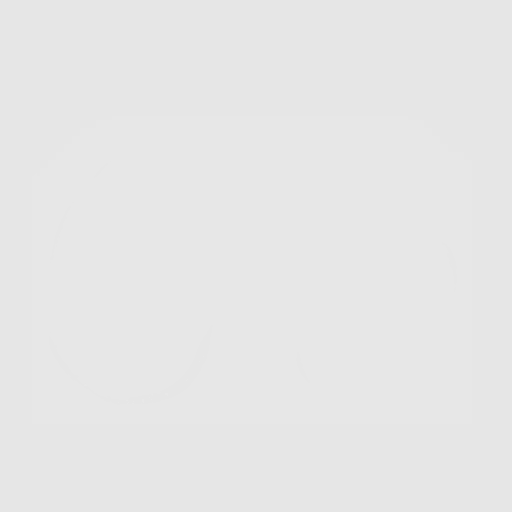
[frame 94/282  lung]
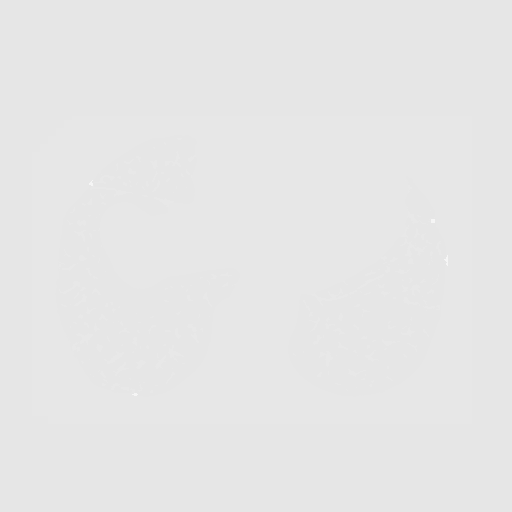
[frame 125/282  mediastinal]
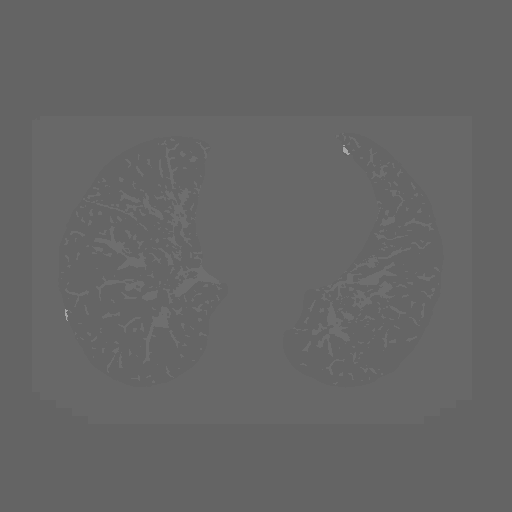
[frame 125/282  lung]
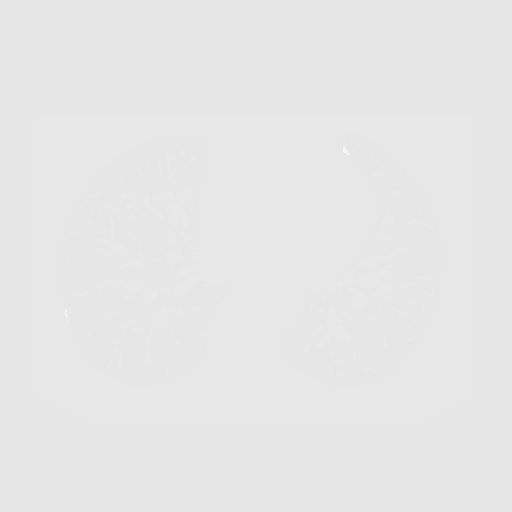
[frame 157/282  lung]
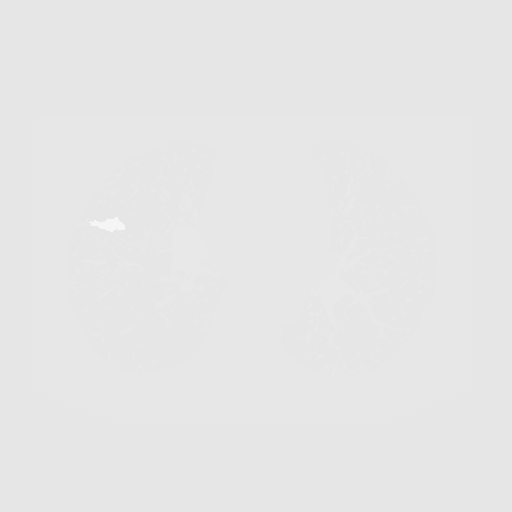
[frame 188/282  lung]
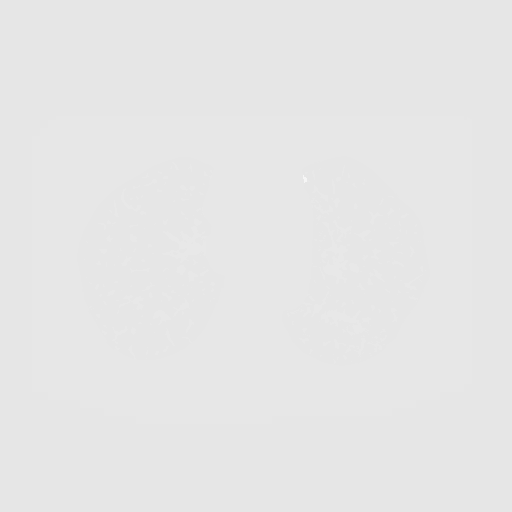
[frame 219/282  lung]
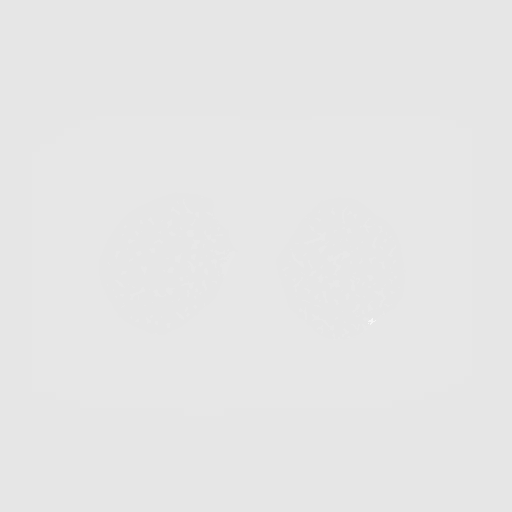
[frame 250/282  mediastinal]
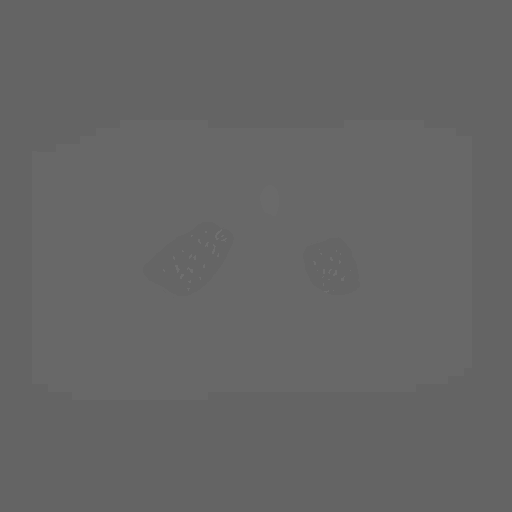
[frame 250/282  lung]
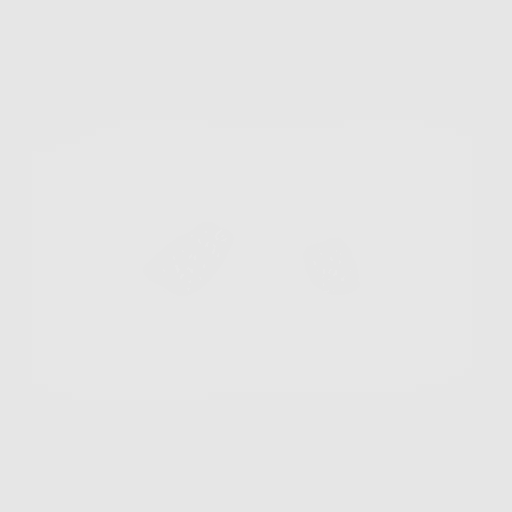

[9 of 40 positions shown; findings below may reference images not displayed]

FINDINGS: Cardiovascular: Normal heart size. No significant pericardial
effusion/thickening. Left anterior descending coronary
atherosclerosis. Atherosclerotic nonaneurysmal thoracic aorta.
Normal caliber pulmonary arteries.

Mediastinum/Nodes: No discrete thyroid nodules. Unremarkable
esophagus. No pathologically enlarged axillary, mediastinal or hilar
lymph nodes, noting limited sensitivity for the detection of hilar
adenopathy on this noncontrast study.

Lungs/Pleura: No pneumothorax. No pleural effusion. Mild
centrilobular emphysema with diffuse bronchial wall thickening. Mild
mosaic attenuation in both lungs. No acute consolidative airspace
disease or lung masses. Two scattered small pulmonary nodules,
largest 3.5 mm in the left upper lobe (series 3/image 41).

Upper abdomen: Diffuse hepatic steatosis.

Musculoskeletal: No aggressive appearing focal osseous lesions.
Moderate thoracic spondylosis.
IMPRESSION: 1. Lung-RADS 2, benign appearance or behavior. Continue annual
screening with low-dose chest CT without contrast in 12 months.
2. One vessel coronary atherosclerosis.
3. Diffuse hepatic steatosis.
4. Aortic Atherosclerosis ([id number].A) and Emphysema ([id number].F).

## 2020-05-11 ENCOUNTER — Ambulatory Visit: Payer: Medicare Other | Admitting: Family Medicine

## 2020-05-17 ENCOUNTER — Ambulatory Visit: Payer: Medicare Other

## 2020-05-18 ENCOUNTER — Ambulatory Visit (INDEPENDENT_AMBULATORY_CARE_PROVIDER_SITE_OTHER): Payer: Medicare Other | Admitting: Family Medicine

## 2020-05-18 ENCOUNTER — Encounter: Payer: Self-pay | Admitting: Family Medicine

## 2020-05-18 ENCOUNTER — Other Ambulatory Visit: Payer: Self-pay

## 2020-05-18 VITALS — BP 124/70 | HR 113 | Wt 176.0 lb

## 2020-05-18 DIAGNOSIS — T148XXA Other injury of unspecified body region, initial encounter: Secondary | ICD-10-CM

## 2020-05-18 DIAGNOSIS — R233 Spontaneous ecchymoses: Secondary | ICD-10-CM | POA: Diagnosis present

## 2020-05-18 DIAGNOSIS — L309 Dermatitis, unspecified: Secondary | ICD-10-CM

## 2020-05-18 MED ORDER — TRIAMCINOLONE ACETONIDE 0.1 % EX OINT
1.0000 | TOPICAL_OINTMENT | Freq: Two times a day (BID) | CUTANEOUS | 1 refills | Status: DC
Start: 2020-05-18 — End: 2020-08-17

## 2020-05-18 NOTE — Assessment & Plan Note (Signed)
Well-controlled at this time.  No active lesions. -Triamcinolone refilled

## 2020-05-18 NOTE — Patient Instructions (Addendum)
Bruising/rash: I think this is nothing scary but we're going to get some tests to look at your liver to make sure that your NAFLD is not worsening.  Smoking: I would love to help you stop if your want. Let me know when your ready!  Eczema: I have refilled your steroid medicine.  Use the moisturizer when your rash is not too dry.

## 2020-05-18 NOTE — Assessment & Plan Note (Signed)
She is not currently taking any blood thinners.  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  Exam is overall reassuring. -Follow-up CBC, CMP, INR -Jean Williams left clinic before blood could be drawn.  She was called and is planning to return to clinic later this week for blood work.

## 2020-05-18 NOTE — Progress Notes (Signed)
    SUBJECTIVE:   CHIEF COMPLAINT / HPI:   Scattered ecchymoses [name] reports that she has been having a small rash that she noted on both forearms for about the past 2 weeks.  The rash consists of multiple dark red spots over both forearms.  They are not itchy.  There is no drainage.  They are not dry.  She is not particularly bothered about the more concerned.  She believes they are age spots in 1 to make sure there is nothing else concerning going on.  She otherwise feels well and has no concerns.  Eczema Her eczema is well controlled but she is requesting a refill on her topical steroids for flares.  PERTINENT  PMH / PSH: Nonalcoholic fatty liver disease  OBJECTIVE:   BP 124/70   Pulse (!) 113   Wt 176 lb (79.8 kg)   LMP 06/10/2011   SpO2 95%   BMI 28.41 kg/m   General: Alert and cooperative and appears to be in no acute distress HEENT: Normal appearing conjunctiva without petechiae Respiratory: Breathing comfortably on room air.  No respiratory distress. Skin: Scattered telangiectasias noted over arms and back.  The rash she is concerned about consists of scattered patches no greater than 2 cm in diameter over bilateral forearms.  They are nonblanching, no evidence of discharge, dryness or erythema.  Nontender to palpation.   ASSESSMENT/PLAN:   Spontaneous ecchymosis She is not currently taking any blood thinners.  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  Exam is overall reassuring. -Follow-up CBC, CMP, INR -[name] left clinic before blood could be drawn.  She was called and is planning to return to clinic later this week for blood work.     [name], MD [place]

## 2020-05-21 ENCOUNTER — Other Ambulatory Visit: Payer: Medicare Other

## 2020-06-07 ENCOUNTER — Other Ambulatory Visit: Payer: Self-pay

## 2020-06-07 MED ORDER — BETAMETHASONE VALERATE 0.1 % EX OINT
1.0000 | TOPICAL_OINTMENT | Freq: Two times a day (BID) | CUTANEOUS | 0 refills | Status: DC
Start: 2020-06-07 — End: 2020-11-19

## 2020-06-07 NOTE — Telephone Encounter (Signed)
Received phone call from April at [facility] regarding rx for betamethasone ointment. Patient is needing a refill of this prescription sent to [facility] pharmacy. If medication is no longer indicated, facility needs a d/c order sent to pharmacy to d/c from patient's profile.   To PCP  Talbot Grumbling, RN

## 2020-06-21 ENCOUNTER — Other Ambulatory Visit: Payer: Self-pay

## 2020-06-21 ENCOUNTER — Ambulatory Visit: Payer: Medicare Other

## 2020-06-21 ENCOUNTER — Ambulatory Visit
Admission: RE | Admit: 2020-06-21 | Discharge: 2020-06-21 | Disposition: A | Discharge status: Home / Self Care | Payer: Medicare Other | Source: Ambulatory Visit | Attending: Psychiatry | Admitting: Psychiatry

## 2020-06-21 DIAGNOSIS — Z1231 Encounter for screening mammogram for malignant neoplasm of breast: Secondary | ICD-10-CM

## 2020-06-21 IMAGING — MG DIGITAL SCREENING BILAT W/ TOMO W/ CAD
8 series · 8 of 24 positions shown · non-contrast
Comparison: Previous exam(s).

CLINICAL DATA: Screening.

EXAM:
DIGITAL SCREENING BILATERAL MAMMOGRAM WITH TOMO AND CAD

[L CC synth-2D]
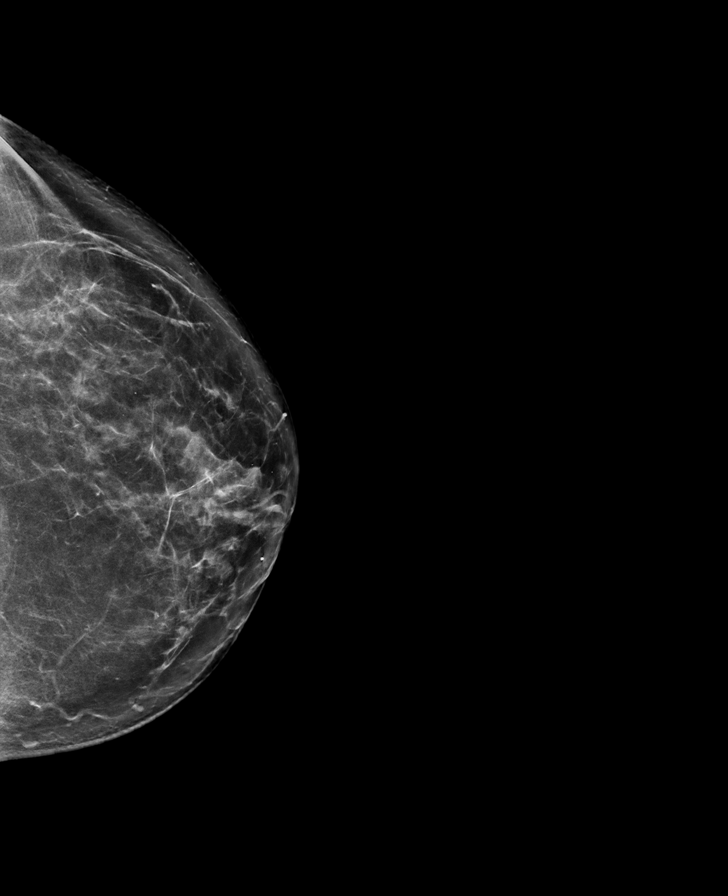

[L MLO synth-2D]
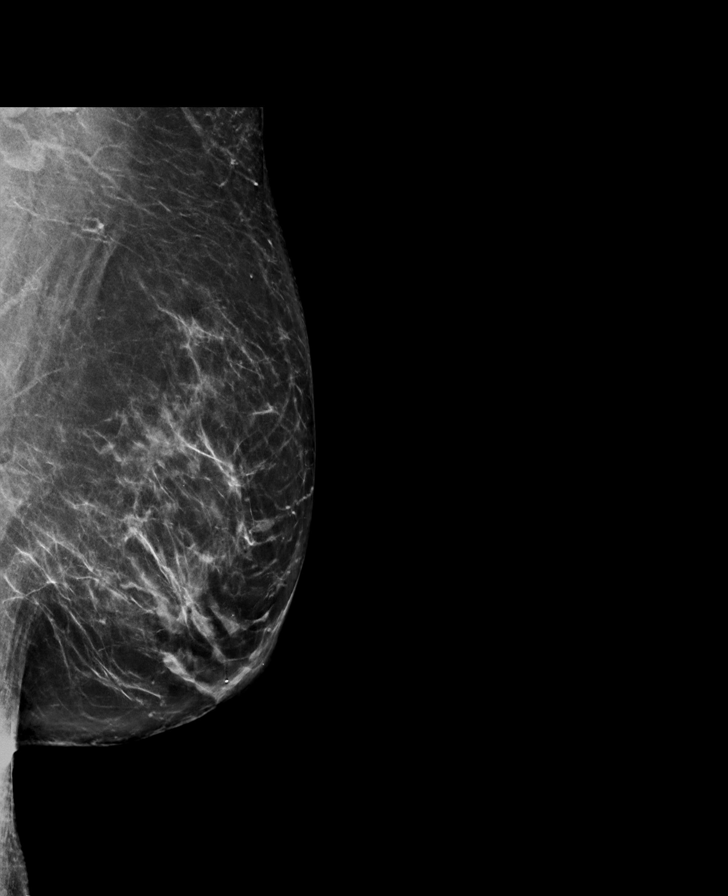

[R MLO synth-2D]
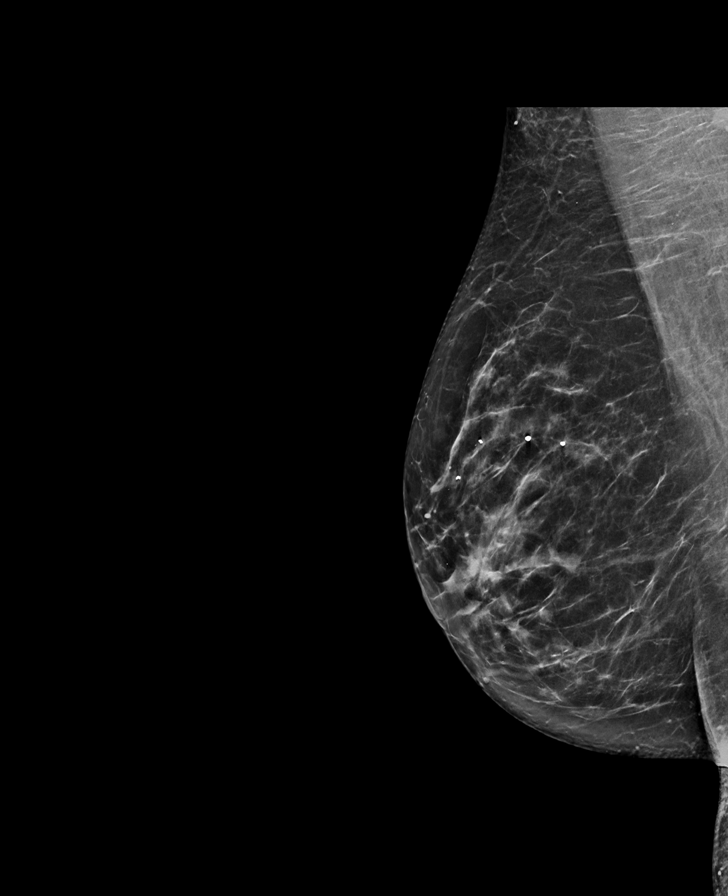

[R CC synth-2D]
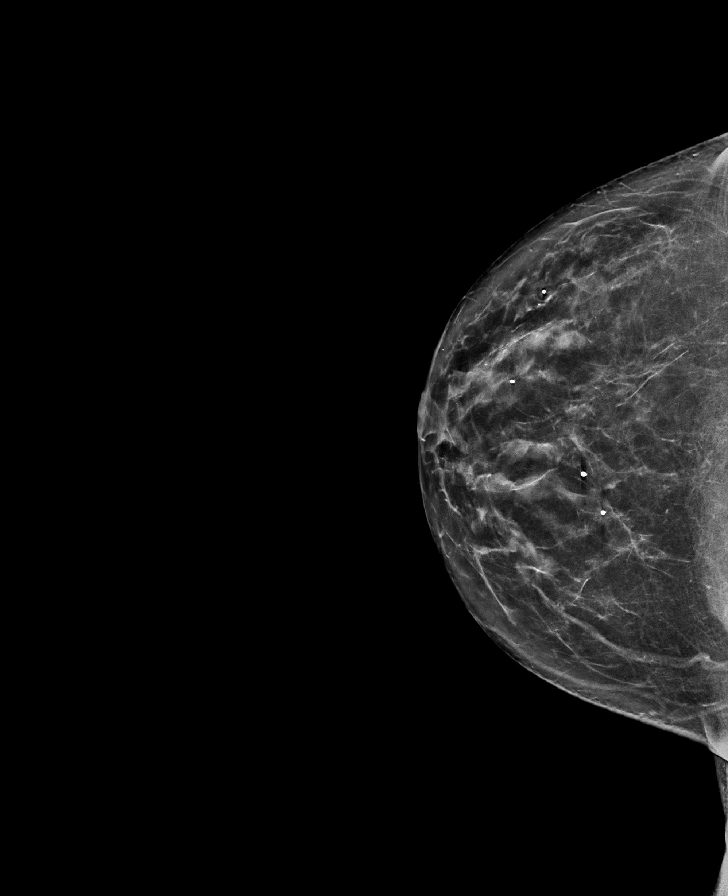

[R CC tomo · tomo slice 37/73.0]
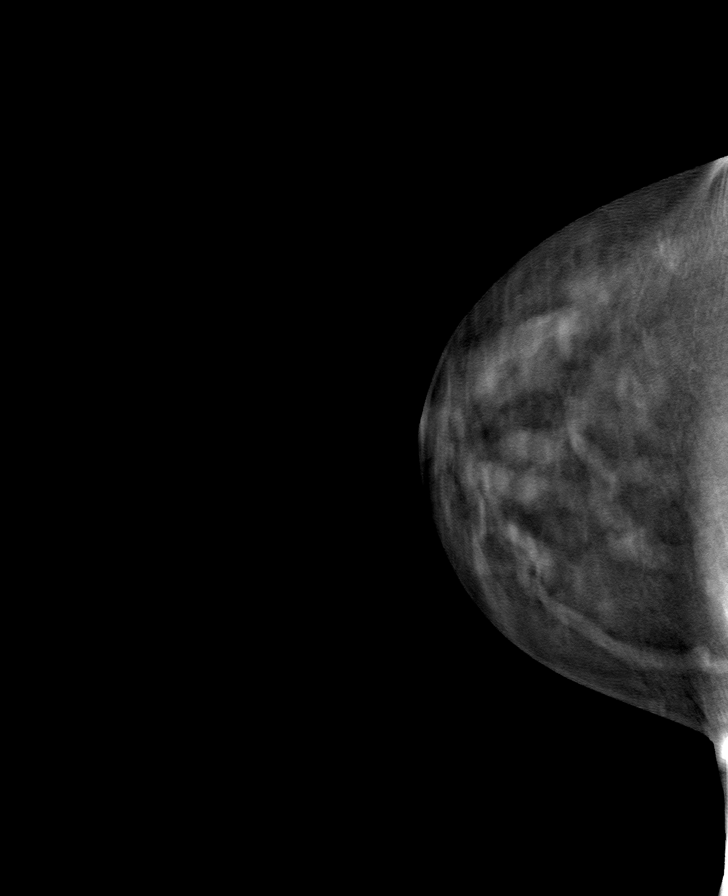

[L MLO tomo · tomo slice 43/86.0]
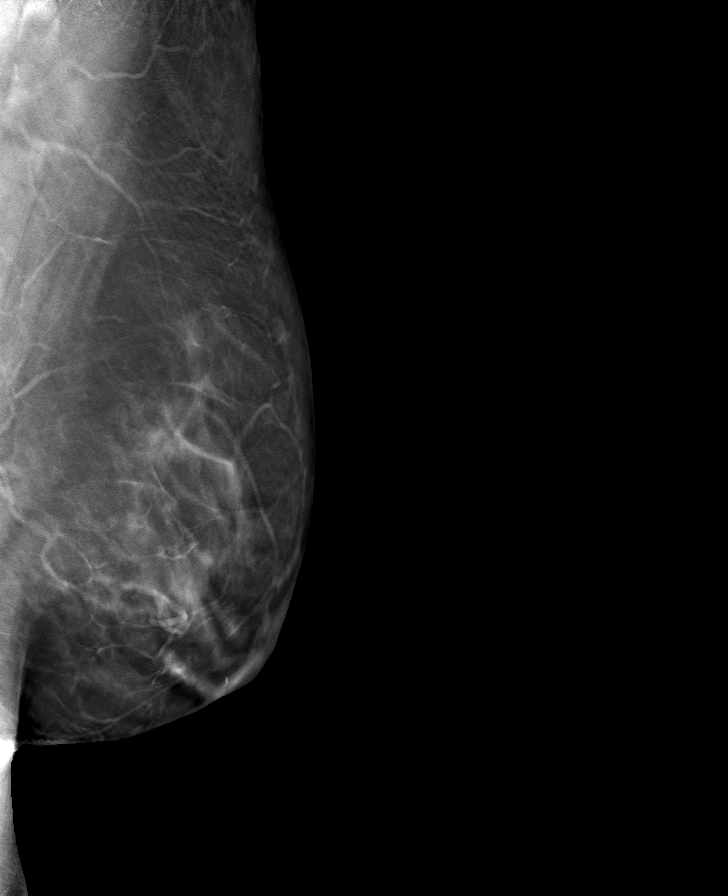

[L CC tomo · tomo slice 42/83.0]
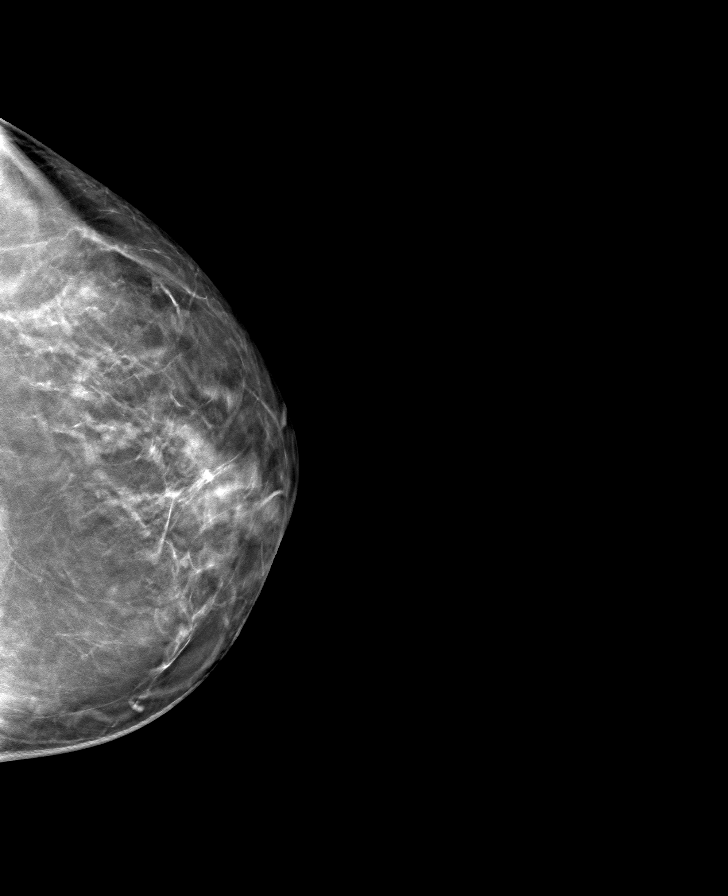

[R MLO tomo · tomo slice 38/75.0]
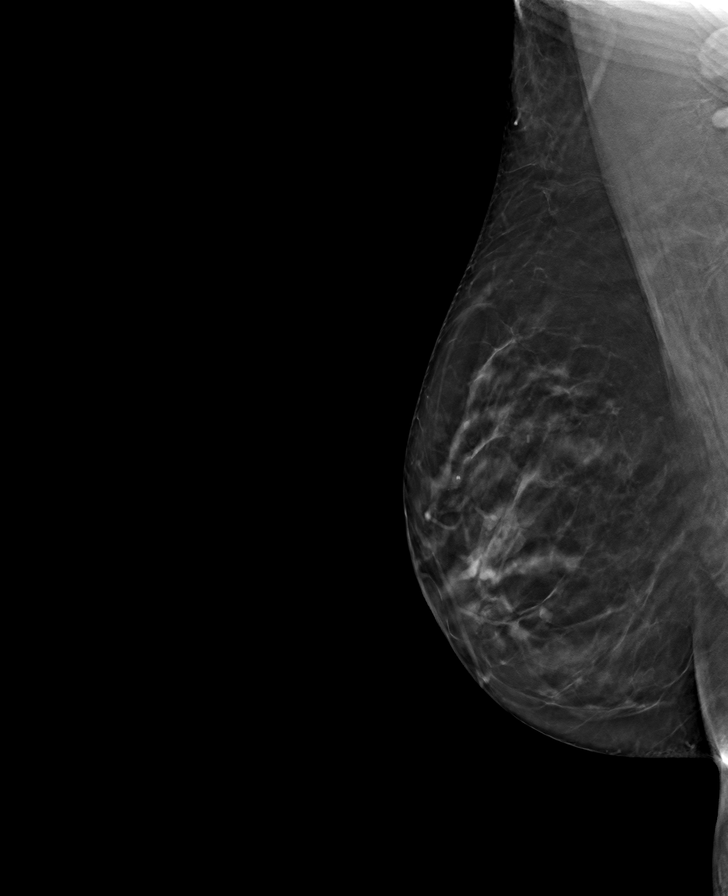

[8 of 24 positions shown; findings below may reference images not displayed]

ACR Breast Density Category b: There are scattered areas of
fibroglandular density.
FINDINGS: There are no findings suspicious for malignancy. Images were
processed with CAD.
IMPRESSION: No mammographic evidence of malignancy. A result letter of this
screening mammogram will be mailed directly to the patient.

RECOMMENDATION:
Screening mammogram in one year. (Code:[id number])

BI-RADS CATEGORY  1: Negative.

## 2020-06-26 ENCOUNTER — Other Ambulatory Visit: Payer: Self-pay

## 2020-06-26 ENCOUNTER — Ambulatory Visit (INDEPENDENT_AMBULATORY_CARE_PROVIDER_SITE_OTHER): Payer: Medicare Other | Admitting: Family Medicine

## 2020-06-26 VITALS — BP 102/64 | HR 117 | Ht 66.0 in | Wt 175.0 lb

## 2020-06-26 DIAGNOSIS — Z23 Encounter for immunization: Secondary | ICD-10-CM | POA: Insufficient documentation

## 2020-06-26 DIAGNOSIS — Z111 Encounter for screening for respiratory tuberculosis: Secondary | ICD-10-CM | POA: Insufficient documentation

## 2020-06-26 DIAGNOSIS — Z0289 Encounter for other administrative examinations: Secondary | ICD-10-CM | POA: Diagnosis not present

## 2020-06-26 NOTE — Progress Notes (Signed)
° ° °  SUBJECTIVE:   CHIEF COMPLAINT / HPI:   TB Test: Patient presents to clinic today to have TB test performed and evaluated  Influenza vaccine: Patient desires flu vaccine at today's visit  [place] FL2 form: Patient presenting to have [place] to form filled out by physician.  Patient reports she is changing locations at her residence and therefore needs this form filled out.  PERTINENT  PMH / PSH:  Schizophrenia, MDD, SKs, NAFLD, Resident of group home   OBJECTIVE:   BP 102/64    Pulse (!) 117    Ht 5\' 6"  (1.676 m)    Wt 175 lb (79.4 kg)    LMP 06/10/2011    SpO2 98%    BMI 28.25 kg/m    Physical exam: General: Well-appearing, no apparent distress Respiratory: CTA bilaterally, comfortable work of breathing  ASSESSMENT/PLAN:   Encounter for completion of form with patient -Patient had [place]-2 form completed  Visit for TB skin test -Patient had TB test performed at today's visit  Need for immunization against influenza -Patient received flu vaccine at today's visit     Daisy Floro, [place]

## 2020-06-26 NOTE — Assessment & Plan Note (Signed)
-  Patient received flu vaccine at today's visit °

## 2020-06-26 NOTE — Assessment & Plan Note (Signed)
-  Patient had [place]-2 form completed

## 2020-06-26 NOTE — Assessment & Plan Note (Signed)
-  Patient had TB test performed at today's visit

## 2020-06-27 ENCOUNTER — Ambulatory Visit: Payer: Medicare Other

## 2020-06-28 ENCOUNTER — Ambulatory Visit (INDEPENDENT_AMBULATORY_CARE_PROVIDER_SITE_OTHER): Payer: Medicare Other

## 2020-06-28 ENCOUNTER — Other Ambulatory Visit: Payer: Self-pay

## 2020-06-28 DIAGNOSIS — Z111 Encounter for screening for respiratory tuberculosis: Secondary | ICD-10-CM

## 2020-06-28 LAB — TB SKIN TEST
Induration: 0 mm
TB Skin Test: NEGATIVE

## 2020-06-28 NOTE — Progress Notes (Signed)
Patient is here for a PPD read.  It was placed on 06/26/2020 in the left forearm @ 1000 am.    PPD RESULTS:  Result: negative Induration: 0 mm  Letter created and given to patient for documentation purposes. Talbot Grumbling, RN

## 2020-07-02 ENCOUNTER — Ambulatory Visit: Payer: Medicare Other

## 2020-07-08 NOTE — Progress Notes (Deleted)
Cardiology Office Note:    Date:  07/08/2020   ID:  Kehaulani, Fruin 05-Aug-1960, MRN [id number]  PCP:  Mirian Mo, MD  [facility] Cardiologist:  No primary care provider on file.  [facility] Electrophysiologist:  None   Referring MD: Mirian Mo, MD    History of Present Illness:    Jean Williams is a 60 y.o. female with a hx of tobacco use, schizophrenia, fatty liver disease and sinus tachycardia who presents to clinic for follow-up.  During our last visit on 04/17/20, the patient was referred by her primary care for evaluation of sinus tachycardia. During that visit, the patient was very tangential in her speech, but overall, she stated she has had fast heart rates for years. She admited to drinking a lot of soda during the day and smoking at least 20 cigarettes. She stated that her "sodas and cigarettes make her happy" and she "does not want anybody to take these away from her." She does not drink much water as she says this disrupts her electrolytes. Denied chest pain,  SOB,  LE edema, syncope.  We recommended on cutting back on her caffeine and smoking as likely was the primary driver of her symptoms.  CTA 2019: Negative for PE TTE 02/2020: Normal BiV function; no significant valvular abnormalities  ECGs reviewed since 2015; all showed sinus tachycardia  Past Medical History:  Diagnosis Date  . Chronic constipation   . Eczema   . Emphysema of lung (HCC)    in epic CT angio Chest-- 01-24-2018  . GAD (generalized anxiety disorder)   . GERD (gastroesophageal reflux disease)   . History of adenomatous polyp of colon   . History of basal cell carcinoma (BCC) excision    05-13-2011   s/p moh's left upper lip  . History of panic attacks   . Hyperlipidemia   . Psoriasis   . Rectal polyp   . Schizophrenia (HCC)    followed by [facility] , dr Pernell Dupre  . Seasonal allergic rhinitis   . Tachycardia    followed by pcp, first dx 2015, no meds for this,  last EKG in  epic dated 02-09-2018    Past Surgical History:  Procedure Laterality Date  . COLONOSCOPY    . TRANSANAL EXCISION OF RECTAL MASS N/A 03/17/2019   Procedure: TRANSANAL EXCISION OF RECTAL POLYP;  Surgeon: Romie Levee, MD;  Location: [facility];  Service: General;  Laterality: N/A;    Current Medications: No outpatient medications have been marked as taking for the 07/18/20 encounter (Appointment) with Meriam Sprague, MD.     Allergies:   Penicillins   Social History   Socioeconomic History  . Marital status: Single    Spouse name: Not on file  . Number of children: Not on file  . Years of education: Not on file  . Highest education level: Not on file  Occupational History  . Not on file  Tobacco Use  . Smoking status: Current Every Day Smoker    Years: 40.00    Types: Cigarettes  . Smokeless tobacco: Never Used  . Tobacco comment: down to 6 cig. per day from 1ppd  Vaping Use  . Vaping Use: Never used  Substance and Sexual Activity  . Alcohol use: No    Alcohol/week: 0.0 standard drinks  . Drug use: Not Currently    Comment: yrs ago  . Sexual activity: Not on file  Other Topics Concern  . Not on file  Social History Narrative  . Not on file   Social Determinants of Health   Financial Resource Strain: Not on file  Food Insecurity: Not on file  Transportation Needs: Not on file  Physical Activity: Not on file  Stress: Not on file  Social Connections: Not on file     Family History: The patient's ***family history includes Breast cancer in her maternal grandmother and sister; Cancer in her maternal grandmother and sister; Heart failure in an other family member; Stroke in an other family member. There is no history of Colon cancer, Colon polyps, Rectal cancer, Stomach cancer, or Esophageal cancer.  ROS:   Please see the history of present illness.    *** All other systems reviewed and are negative.  EKGs/Labs/Other Studies Reviewed:     The following studies were reviewed today: TTE 26-Feb-2020: IMPRESSIONS  1. Left ventricular ejection fraction, by estimation, is 55 to 60%. The  left ventricle has normal function. The left ventricle has no regional  wall motion abnormalities. There is mild asymmetric left ventricular  hypertrophy of the basal and septal  segments. Left ventricular diastolic parameters are indeterminate.  2. Right ventricular systolic function is normal. The right ventricular  size is normal.  3. The mitral valve is normal in structure. Trivial mitral valve  regurgitation. No evidence of mitral stenosis.  4. The aortic valve is normal in structure. Aortic valve regurgitation is  trivial. No aortic stenosis is present.  5. The inferior vena cava is normal in size with greater than 50%  respiratory variability, suggesting right atrial pressure of 3 mmHg.   CTA 01/2018: IMPRESSION: 1. Negative for acute pulmonary embolus. 2. Minimal emphysema 3. 3 mm left lower lobe pulmonary nodule. No follow-up needed if patient is low-risk. Non-contrast chest CT can be considered in 12 months if patient is high-risk. This recommendation follows the consensus statement: Guidelines for Management of Incidental Pulmonary Nodules Detected on CT Images: From the Fleischner Society 2017; Radiology 2017; 284:228-243  CT Chest 2020: Cardiovascular: Scattered aortic atherosclerosis. Normal heart size. LAD coronary artery calcifications. No pericardial effusion. IMPRESSION: 1. Stable, benign 3 mm pulmonary nodule of the left lower lobe (series 3, image 61). No further routine CT follow-up is required for this nodule.  2. Mild, diffuse bilateral bronchial wall thickening, most notable in the lower lobe bronchi bilaterally and similar to prior examination. Findings are consistent with nonspecific infectious or inflammatory bronchitis.  3. There is increased paramedian consolidation of the medial right upper  lobe with proximal bronchial wall thickening and plugging (series 3, image 71). Findings are consistent with atypical infection, particularly atypical mycobacterium with this location. There is no significant pulmonary nodularity.  4. Ill-defined, irregular hypodensity of the liver parenchyma (series 2, image 120). This appearance is of uncertain significance, and may reflect perfusion abnormality or uneven hepatic steatosis, however mass or metastatic disease is not excluded, particularly given that this appearance is new in comparison to CT examination dated 11/10/2017. Recommend multiphasic contrast enhanced examination of the abdomen and pelvis to further evaluate.  5. Coronary artery disease. Aortic Atherosclerosis (ICD10-I70.0).  EKG:  EKG is *** ordered today.  The ekg ordered today demonstrates ***  Recent Labs: 11/01/2019: ALT 24; BUN 12; Creatinine, Ser 0.82; Hemoglobin 15.4; Platelets 299; Potassium 4.4; Sodium 141; TSH 1.350  Recent Lipid Panel    Component Value Date/Time   CHOL 136 04/26/2019 1428   TRIG 352 (H) 04/26/2019 1428   HDL 33 (L) 04/26/2019 1428   CHOLHDL 4.1 04/26/2019  1428   CHOLHDL 5.1 05/29/2014 1709   VLDL NOT CALC 05/29/2014 1709   LDLCALC 50 04/26/2019 1428     Risk Assessment/Calculations:   {Does this patient have ATRIAL FIBRILLATION?:[id number]}   Physical Exam:    VS:  LMP 06/10/2011     Wt Readings from Last 3 Encounters:  06/26/20 175 lb (79.4 kg)  05/18/20 176 lb (79.8 kg)  05/01/20 174 lb 6 oz (79.1 kg)     GEN: *** Well nourished, well developed in no acute distress HEENT: Normal NECK: No JVD; No carotid bruits LYMPHATICS: No lymphadenopathy CARDIAC: ***RRR, no murmurs, rubs, gallops RESPIRATORY:  Clear to auscultation without rales, wheezing or rhonchi  ABDOMEN: Soft, non-tender, non-distended MUSCULOSKELETAL:  No edema; No deformity  SKIN: Warm and dry NEUROLOGIC:  Alert and oriented x 3 PSYCHIATRIC:  Normal affect    ASSESSMENT:    No diagnosis found. PLAN:    In order of problems listed above:  #Sinus tachycardia: Suspect secondary to significant caffeine intake with her soda, cigarette use and possibly medication effect from home meds. Patient also does not appear to be drinking much water at home. TTE with normal BiV function with no significant valvular disease. CTA in 2019 without PE. Patient denies palpitations, lightheadedness or syncope. Recommended cutting back on caffeine and cigarettes and increasing water intake.  -Cut back on soda and cigarette use as likely primary driver of tachycardia; she really does not want to stop them at this time as it provides her joy. Cutting back will help with the tachycardia. -Increase water intake -TTE reassuring with normal BiV function and no significant valvular abnormalities -No evidence of PE and CTA in 2019 normal -No BB at this time as not indicated if patient asymptomatic and sinus tachycardia with normal TTE  #Coronary calcification: Noted on CT chest in 2020 with both LAD calcification and aortic calcification. Current tobacco smoker. -Continue atorvastatin 40mg  daily  #HLD: #Elevated TG: -Continue atorvastatin 40mg  daily -Consider vascepa pending repeat lipid panel at next visit  #Tobacco abuse: Patient does not want to quit at this time. Counseled that cutting back will help lower CVD risk as well as help with her tachycardia. She states she will try to cut back. -Continue ongoing counseling about smoking cessation -Patient willing to cut back at this time  #Schizophrenia: Patient very tangential during interview today with difficulty taking history.  -Continue follow-up with primary providers   {Are you ordering a CV Procedure (e.g. stress test, cath, DCCV, TEE, etc)?   Press F2        :[id number]    Medication Adjustments/Labs and Tests Ordered: Current medicines are reviewed at length with the patient today.  Concerns  regarding medicines are outlined above.  No orders of the defined types were placed in this encounter.  No orders of the defined types were placed in this encounter.   There are no Patient Instructions on file for this visit.   Signed, Freada Bergeron, MD  07/08/2020 12:03 PM    [place]

## 2020-07-18 ENCOUNTER — Ambulatory Visit: Payer: Medicare Other | Admitting: Cardiology

## 2020-08-16 ENCOUNTER — Ambulatory Visit: Payer: Medicare Other

## 2020-08-17 ENCOUNTER — Other Ambulatory Visit: Payer: Self-pay | Admitting: Family Medicine

## 2020-08-17 ENCOUNTER — Encounter: Payer: Self-pay | Admitting: Family Medicine

## 2020-08-17 ENCOUNTER — Other Ambulatory Visit: Payer: Self-pay

## 2020-08-17 ENCOUNTER — Ambulatory Visit (INDEPENDENT_AMBULATORY_CARE_PROVIDER_SITE_OTHER): Payer: Medicare Other | Admitting: Family Medicine

## 2020-08-17 DIAGNOSIS — L259 Unspecified contact dermatitis, unspecified cause: Secondary | ICD-10-CM | POA: Insufficient documentation

## 2020-08-17 DIAGNOSIS — L240 Irritant contact dermatitis due to detergents: Secondary | ICD-10-CM

## 2020-08-17 MED ORDER — PREDNISONE 20 MG PO TABS: 40.0000 mg | ORAL_TABLET | Freq: Every day | ORAL | 0 refills | Status: AC

## 2020-08-17 MED ORDER — TRIAMCINOLONE ACETONIDE 0.1 % EX OINT
1.0000 | TOPICAL_OINTMENT | Freq: Two times a day (BID) | CUTANEOUS | 1 refills | Status: DC
Start: 2020-08-17 — End: 2020-08-17

## 2020-08-17 MED ORDER — TRIAMCINOLONE ACETONIDE 0.1 % EX OINT
1.0000 | TOPICAL_OINTMENT | Freq: Two times a day (BID) | CUTANEOUS | 1 refills | Status: DC
Start: 2020-08-17 — End: 2020-11-19

## 2020-08-17 MED ORDER — PREDNISONE 20 MG PO TABS: 40.0000 mg | ORAL_TABLET | Freq: Every day | ORAL | 0 refills | Status: DC

## 2020-08-17 NOTE — Progress Notes (Signed)
     SUBJECTIVE:   CHIEF COMPLAINT / HPI:   Jean Williams is a 60 y.o. female presents for rash on chest    Rash Pt reports 2-3 week hx of of pink, pruritic rash over chest, arms and forearm. At times she gets blisters which bleed and then dry up.  Her skin feels "tight".  She has recently moved to a different apartment where they do her laundry and and they used Tide detergent to wash her clothes.  She has been applying an ointment to the affected areas but unsure of name. Has had rashes before both nothing as extensive.  Denies fevers, feeling unwell, mouth sores, face or tongue swelling, dyspnea or joint swelling or pains.  No recent travel, no anticoagulant use.  PERTINENT  PMH / PSH: eczema, asthma, orthostatic hypotension   OBJECTIVE:   BP 102/64   Pulse (!) 114   Wt 169 lb (76.7 kg)   LMP 06/10/2011   SpO2 95%   BMI 27.28 kg/m    General: Alert, no acute distress Cardio: Well-perfused Pulm:  wheezingnormal work of breathing Neuro: Cranial nerves grossly intact  Skin rash as seen below          ASSESSMENT/PLAN:   Contact dermatitis Most likely diagnosis is contact dermatitis secondary to new laundry detergent. Patient also has a history of this a few years back which was treated with topical steroids.  Also considered contact dermatitis secondary to photosensitivity however patient has not exposed to the sun recently and rash extends to her breast area. Given that the rash extends over a large surface area we will treat with oral prednisone 40 mg for 5 days and triamcinolone cream twice daily.  Counseled patient on the side effects of steroids such as elevation in mood, insomnia etc.  Recommend she takes the prednisone in the morning. Follow-up with me on 2/18.     Lattie Haw, MD PGY-2 [place]

## 2020-08-17 NOTE — Patient Instructions (Addendum)
Thank you for coming to see me today. It was a pleasure. Today we discussed your skin rash. I think it is an allergic reaction to the detergent. Re-wash your clothes that were washed in the tide detergent. Buy a hypoallergic, non fragranced detergent. Do not use the tide detergent.  I am treating you with a course of steroids 40mg  once a day for 5 days and a cream to apply to the areas twice a day. This should help with the rash.  Please follow-up with me in 1 weeks   If you have any questions or concerns, please do not hesitate to call the office at [phone].  Best wishes,   Dr Posey Pronto

## 2020-08-17 NOTE — Assessment & Plan Note (Addendum)
Most likely diagnosis is contact dermatitis secondary to new laundry detergent. Patient also has a history of this a few years back which was treated with topical steroids.  Also considered contact dermatitis secondary to photosensitivity however patient has not exposed to the sun recently and rash extends to her breast area. Given that the rash extends over a large surface area we will treat with oral prednisone 40 mg for 5 days and triamcinolone cream twice daily.  Counseled patient on the side effects of steroids such as elevation in mood, insomnia etc.  Recommend she takes the prednisone in the morning. Follow-up with me on 2/18.

## 2020-08-24 ENCOUNTER — Ambulatory Visit: Payer: Medicare Other | Admitting: Family Medicine

## 2020-08-24 NOTE — Progress Notes (Deleted)
     SUBJECTIVE:   CHIEF COMPLAINT / HPI:   Jean Williams is a 60 y.o. female presents for follow up for rash   Contact dermatitis  Presents for follow up for contact dermatitis. Seen in clinic 1 week ago and treated with 5 day course of prednisone and topical triamcinolone. Since then patient reports improvement in symptoms **. Associated symptoms include **.     PERTINENT  PMH / PSH: seasonal allergies, dermatitis, seborrheic keratosis  OBJECTIVE:   LMP 06/10/2011    General: Alert, no acute distress Cardio: Normal S1 and S2, RRR, no r/m/g Pulm: CTAB, normal work of breathing Abdomen: Bowel sounds normal. Abdomen soft and non-tender.  Extremities: No peripheral edema.  Neuro: Cranial nerves grossly intact   ASSESSMENT/PLAN:   No problem-specific Assessment & Plan notes found for this encounter.     Lattie Haw, MD PGY-2 [place]

## 2020-10-15 ENCOUNTER — Ambulatory Visit (INDEPENDENT_AMBULATORY_CARE_PROVIDER_SITE_OTHER): Payer: Medicare Other

## 2020-10-15 ENCOUNTER — Other Ambulatory Visit: Payer: Self-pay

## 2020-10-15 DIAGNOSIS — Z111 Encounter for screening for respiratory tuberculosis: Secondary | ICD-10-CM | POA: Diagnosis present

## 2020-10-15 NOTE — Progress Notes (Signed)
Patient presents in nurse clinic for PPD placement.  PPD placed left ventral forearm.  Patient to return on 4/13 to have site read.  Reminder card given.

## 2020-10-17 ENCOUNTER — Ambulatory Visit: Payer: Medicare Other

## 2020-10-17 ENCOUNTER — Other Ambulatory Visit: Payer: Self-pay

## 2020-10-17 DIAGNOSIS — Z111 Encounter for screening for respiratory tuberculosis: Secondary | ICD-10-CM

## 2020-10-17 LAB — TB SKIN TEST
Induration: 0 mm
TB Skin Test: NEGATIVE

## 2020-10-17 NOTE — Progress Notes (Signed)
PPD Reading Note PPD read and results entered in EpicCare. Result: 0 mm induration. Interpretation: Negative Allergic reaction: No  

## 2020-10-31 ENCOUNTER — Ambulatory Visit: Payer: Medicare Other | Admitting: Family Medicine

## 2020-11-01 ENCOUNTER — Other Ambulatory Visit: Payer: Self-pay

## 2020-11-01 ENCOUNTER — Ambulatory Visit (INDEPENDENT_AMBULATORY_CARE_PROVIDER_SITE_OTHER): Payer: Medicare Other | Admitting: Student in an Organized Health Care Education/Training Program

## 2020-11-01 VITALS — BP 105/70 | HR 108 | Ht 67.0 in | Wt 166.6 lb

## 2020-11-01 DIAGNOSIS — L989 Disorder of the skin and subcutaneous tissue, unspecified: Secondary | ICD-10-CM | POA: Diagnosis present

## 2020-11-01 DIAGNOSIS — Z711 Person with feared health complaint in whom no diagnosis is made: Secondary | ICD-10-CM | POA: Diagnosis not present

## 2020-11-01 DIAGNOSIS — R000 Tachycardia, unspecified: Secondary | ICD-10-CM | POA: Diagnosis not present

## 2020-11-01 DIAGNOSIS — Z0289 Encounter for other administrative examinations: Secondary | ICD-10-CM

## 2020-11-01 NOTE — Patient Instructions (Signed)
It was a pleasure to see you today!  To summarize our discussion for this visit:  I will place your forms in your PCPs box to complete and fax back within about a week.  I am sending a referral in to dermatology to check in on the skin lesion on your shoulder for concern that it could be skin cancer. Please let us know if you do not hear from them soon.  I'm glad that you feel that you have the help you need for your mental health. Please come back if you feel we can help with anything else.   Some additional health maintenance measures we should update are: Health Maintenance Due  Topic Date Due  . COVID-19 Vaccine (3 - Moderna risk 4-dose series) 09/09/2019  .    Call the clinic at [phone] if your symptoms worsen or you have any concerns.   Thank you for allowing me to take part in your care,  Dr. Doristine Mango

## 2020-11-01 NOTE — Assessment & Plan Note (Signed)
Chronic. HR 108. Asymptomatic today.

## 2020-11-01 NOTE — Assessment & Plan Note (Signed)
Offered to perform punch biopsy today or refer to dermatology.  Patient preferred dermatology referral as she has several skin concerns that she would like to discuss with them.  My concern is that the lesion has features of basal cell carcinoma which I shared with the patient and urged her to get it checked out early. She expressed understanding.  - derm referral placed

## 2020-11-01 NOTE — Assessment & Plan Note (Signed)
Form appears to be completed with patient specific information and requiring PCP or attending signatures. Placed in PCP box and informed patient we can fax back within a week.

## 2020-11-01 NOTE — Progress Notes (Signed)
    SUBJECTIVE:   CHIEF COMPLAINT / HPI: FL2 paperwork, skin changes  FL2- form at appointment today requiring PCP signature.  Skin changes- patient has lesion on R shoulder that has been gradually growing over months. She has picked it and squeezed it and had a scab come off the top. Denies bleeding. Endorses a high amount of sun exposure to her skin in her past.  Also has several other chronic skin concerns and is not adherent with her steroid cream.  Mental health- patient had PHQ-9 score of 16 and answered 1 to question 9. When I brought it up, patient denied SI and explained that it was an ongoing presence as a part of her mental health. She sees a psychiatrist and is happy with her care. She does not desire any further help at this time on this matter.   OBJECTIVE:   BP 105/70   Pulse (!) 108   Ht 5\' 7"  (1.702 m)   Wt 166 lb 9.6 oz (75.6 kg)   LMP 06/10/2011   SpO2 96%   BMI 26.09 kg/m   Physical Exam Vitals and nursing note reviewed.  Constitutional:      General: She is not in acute distress.    Appearance: She is obese. She is not ill-appearing, toxic-appearing or diaphoretic.  Skin:    General: Skin is warm and dry.     Findings: Erythema and lesion present.     Comments: R lateral shoulder- Raised lesion with peripheral telangiectasias and central scabbing. Non-tender to palpation. Please see clinical image for further detail.   Neurological:     Mental Status: She is alert.        ASSESSMENT/PLAN:   Encounter for completion of form with patient Form appears to be completed with patient specific information and requiring PCP or attending signatures. Placed in PCP box and informed patient we can fax back within a week.   Skin lesion of right upper extremity Offered to perform punch biopsy today or refer to dermatology.  Patient preferred dermatology referral as she has several skin concerns that she would like to discuss with them.  My concern is that the  lesion has features of basal cell carcinoma which I shared with the patient and urged her to get it checked out early. She expressed understanding.  - derm referral placed  Tachycardia Chronic. HR 108. Asymptomatic today.      [place]

## 2020-11-01 NOTE — Assessment & Plan Note (Signed)
>>  ASSESSMENT AND PLAN FOR TACHYCARDIA WRITTEN ON 11/01/2020  9:21 AM BY ANDERSON, CHELSEY L, DO  Chronic. HR 108. Asymptomatic today.

## 2020-11-12 ENCOUNTER — Ambulatory Visit: Payer: Medicare Other | Admitting: Family Medicine

## 2020-11-19 ENCOUNTER — Other Ambulatory Visit: Payer: Self-pay

## 2020-11-19 MED ORDER — BETAMETHASONE VALERATE 0.1 % EX OINT
1.0000 | TOPICAL_OINTMENT | Freq: Two times a day (BID) | CUTANEOUS | 0 refills | Status: DC
Start: 2020-11-19 — End: 2021-03-01

## 2020-11-19 MED ORDER — TRIAMCINOLONE ACETONIDE 0.1 % EX OINT
1.0000 | TOPICAL_OINTMENT | Freq: Two times a day (BID) | CUTANEOUS | 1 refills | Status: DC
Start: 2020-11-19 — End: 2021-03-06

## 2020-11-21 ENCOUNTER — Other Ambulatory Visit: Payer: Self-pay

## 2020-11-21 MED ORDER — FLUTICASONE PROPIONATE 50 MCG/ACT NA SUSP
NASAL | 0 refills | Status: DC
Start: 2020-11-21 — End: 2021-05-22

## 2021-01-30 ENCOUNTER — Telehealth (INDEPENDENT_AMBULATORY_CARE_PROVIDER_SITE_OTHER): Payer: Medicare Other | Admitting: Family Medicine

## 2021-01-30 ENCOUNTER — Telehealth: Payer: Self-pay

## 2021-01-30 DIAGNOSIS — Z593 Problems related to living in residential institution: Secondary | ICD-10-CM | POA: Diagnosis not present

## 2021-01-30 DIAGNOSIS — J989 Respiratory disorder, unspecified: Secondary | ICD-10-CM

## 2021-01-30 DIAGNOSIS — R059 Cough, unspecified: Secondary | ICD-10-CM | POA: Diagnosis not present

## 2021-01-30 MED ORDER — GUAIFENESIN 100 MG/5ML PO SOLN: 5.0000 mL | Freq: Four times a day (QID) | ORAL | 0 refills | Status: DC | PRN

## 2021-01-30 NOTE — Progress Notes (Signed)
[facility] Visit  Patient consented to have virtual visit and was identified by name and date of birth. Method of visit: Telephone  Encounter participants: Patient: Jean Williams - located at [facility] Provider: Rise Patience - located off [facility]  Others (if applicable): Clarene Critchley (caregiver)  Chief Complaint: Cough  HPI:  Patient is somewhat of a difficult historian in regards to this recent illness. Per patient and caregiver, she has been having sickness for several days now which includes chills, sweating, fatigue, rhinorrhea, and some mild chest pain when coughing. She denies shortness of breath or difficulty breathing, N/V, diarrhea, abdominal pains. Max temperature recorded was 99.62F and has taken Tylenol with some improvement. Per the caregiver, patient has been coughing quite a bit at night which has made if very difficult to sleep. There is concern for COVID and the facility does not have a pulse-ox to monitor oxygen saturation but states that the patient has been breathing well and does not seem in distress and is still comfortably able to smoke her cigarettes. They are quarantining all of the residents at this time just in case  Patient is a known smoker and has a cough at baseline, though it is changed from baseline with this illness.   ROS: per HPI  Pertinent PMHx: Reviewed  Exam:  LMP 06/10/2011   Respiratory: able to speak in full sentences, does not cough during the interview, is able to answer most questions directly.  Assessment/Plan:  Respiratory illness Patient with several days of respiratory illness without true documented fever. Concern for possible COVID though the facility has not had the patient tested, precautions are being put in place. Currently patient sounds reassuring and is not struggling to breath through the conversation, discussed precautions for going to the ED for further evaluation with the caregiver and  to closely monitor respiratory status. Patient has smoker cough at baseline and asthma, which makes her more susceptible to severe respiratory disease and it will be important to continue monitoring closely.  - Prescribed robitussin for cough q6h PRN - ED precautions given to caregiver - Recommend getting a pulse-ox to monitor oxygen saturation if worsening respiratory status   Time spent during visit with patient: 22 minutes

## 2021-01-30 NOTE — Telephone Encounter (Signed)
Patient requested virtual visit for this afternoon, so Dr. Oleh Genin has taken care of this request.  Patient lives in a group home and cannot have any medications that are not processed through the pharmacy with a provider's order.   Thanks!   Talbot Grumbling, RN

## 2021-01-30 NOTE — Telephone Encounter (Signed)
Supervisor at [employer] calls nurse line requesting cough medication for patient. Patient can not take OTC medication without provider order and being processed by pharmacy.   Please advise.   Talbot Grumbling, RN

## 2021-03-01 ENCOUNTER — Other Ambulatory Visit: Payer: Self-pay

## 2021-03-01 DIAGNOSIS — L989 Disorder of the skin and subcutaneous tissue, unspecified: Secondary | ICD-10-CM

## 2021-03-04 MED ORDER — BETAMETHASONE VALERATE 0.1 % EX OINT: 1.0000 "application " | TOPICAL_OINTMENT | Freq: Two times a day (BID) | CUTANEOUS | 0 refills | Status: DC

## 2021-03-06 ENCOUNTER — Other Ambulatory Visit: Payer: Self-pay

## 2021-03-06 MED ORDER — TRIAMCINOLONE ACETONIDE 0.1 % EX OINT
1.0000 | TOPICAL_OINTMENT | Freq: Two times a day (BID) | CUTANEOUS | 1 refills | Status: DC
Start: 2021-03-06 — End: 2022-05-19

## 2021-03-06 NOTE — Telephone Encounter (Signed)
Refilled triamcinolone for eczema- long term use

## 2021-04-15 ENCOUNTER — Ambulatory Visit: Payer: Medicare Other | Admitting: Dermatology

## 2021-05-22 ENCOUNTER — Other Ambulatory Visit: Payer: Self-pay

## 2021-05-22 DIAGNOSIS — F209 Schizophrenia, unspecified: Secondary | ICD-10-CM

## 2021-05-22 DIAGNOSIS — J302 Other seasonal allergic rhinitis: Secondary | ICD-10-CM

## 2021-05-29 ENCOUNTER — Other Ambulatory Visit: Payer: Self-pay | Admitting: Student

## 2021-05-29 DIAGNOSIS — L989 Disorder of the skin and subcutaneous tissue, unspecified: Secondary | ICD-10-CM

## 2021-05-30 MED ORDER — LORATADINE 10 MG PO TABS: 10.0000 mg | ORAL_TABLET | Freq: Every day | ORAL | 2 refills | Status: DC

## 2021-05-30 MED ORDER — FLUTICASONE PROPIONATE 50 MCG/ACT NA SUSP
NASAL | 0 refills | Status: DC
Start: 2021-05-30 — End: 2021-12-05

## 2021-05-30 MED ORDER — FAMOTIDINE 20 MG PO TABS: 20.0000 mg | ORAL_TABLET | Freq: Two times a day (BID) | ORAL | 0 refills | Status: DC

## 2021-05-30 MED ORDER — OYSTER SHELL 500 MG PO TABS
1.0000 | ORAL_TABLET | Freq: Two times a day (BID) | ORAL | 0 refills | Status: DC
Start: 2021-05-30 — End: 2022-05-19

## 2021-05-30 MED ORDER — FLUTICASONE PROPIONATE HFA 110 MCG/ACT IN AERO
INHALATION_SPRAY | RESPIRATORY_TRACT | 0 refills | Status: DC
Start: 2021-05-30 — End: 2021-12-05

## 2021-05-30 MED ORDER — ATORVASTATIN CALCIUM 40 MG PO TABS
40.0000 mg | ORAL_TABLET | Freq: Every day | ORAL | 3 refills | Status: DC
Start: 2021-05-30 — End: 2022-05-19

## 2021-06-26 ENCOUNTER — Other Ambulatory Visit: Payer: Self-pay

## 2021-06-26 ENCOUNTER — Telehealth: Payer: Self-pay | Admitting: Student

## 2021-06-26 ENCOUNTER — Ambulatory Visit (INDEPENDENT_AMBULATORY_CARE_PROVIDER_SITE_OTHER): Payer: Medicare Other

## 2021-06-26 DIAGNOSIS — Z23 Encounter for immunization: Secondary | ICD-10-CM | POA: Diagnosis not present

## 2021-06-26 NOTE — Telephone Encounter (Signed)
Pt states the caregiver wants to know if she can have the cream in her possession and administer it herself. Please call her caregiver Clarene Critchley at [phone]

## 2021-06-29 ENCOUNTER — Encounter: Payer: Self-pay | Admitting: Student

## 2021-07-29 ENCOUNTER — Other Ambulatory Visit: Payer: Self-pay | Admitting: Psychiatry

## 2021-07-29 DIAGNOSIS — Z1231 Encounter for screening mammogram for malignant neoplasm of breast: Secondary | ICD-10-CM

## 2021-08-05 ENCOUNTER — Telehealth: Payer: Self-pay | Admitting: Student

## 2021-08-05 NOTE — Telephone Encounter (Signed)
Patient called back while typing.

## 2021-08-06 ENCOUNTER — Ambulatory Visit: Payer: Medicare Other

## 2021-08-07 ENCOUNTER — Ambulatory Visit: Payer: Medicare Other

## 2021-08-16 ENCOUNTER — Ambulatory Visit
Admission: RE | Admit: 2021-08-16 | Discharge: 2021-08-16 | Disposition: A | Discharge status: Home / Self Care | Payer: Medicare Other | Source: Ambulatory Visit | Attending: Psychiatry | Admitting: Psychiatry

## 2021-08-16 DIAGNOSIS — Z1231 Encounter for screening mammogram for malignant neoplasm of breast: Secondary | ICD-10-CM

## 2021-08-16 IMAGING — MG MM DIGITAL SCREENING BILAT W/ TOMO AND CAD
8 series · 8 of 24 positions shown · non-contrast
Comparison: Previous exam(s).

CLINICAL DATA: Screening.

EXAM:
DIGITAL SCREENING BILATERAL MAMMOGRAM WITH TOMOSYNTHESIS AND CAD
TECHNIQUE: Bilateral screening digital craniocaudal and mediolateral oblique
mammograms were obtained. Bilateral screening digital breast
tomosynthesis was performed. The images were evaluated with
computer-aided detection.

[L MLO synth-2D]
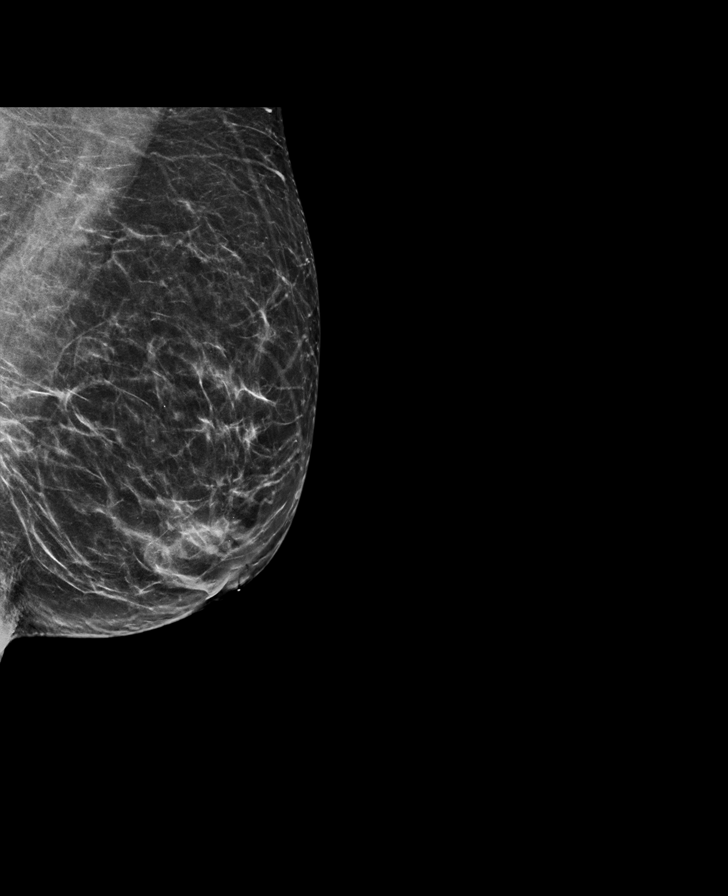

[R MLO synth-2D]
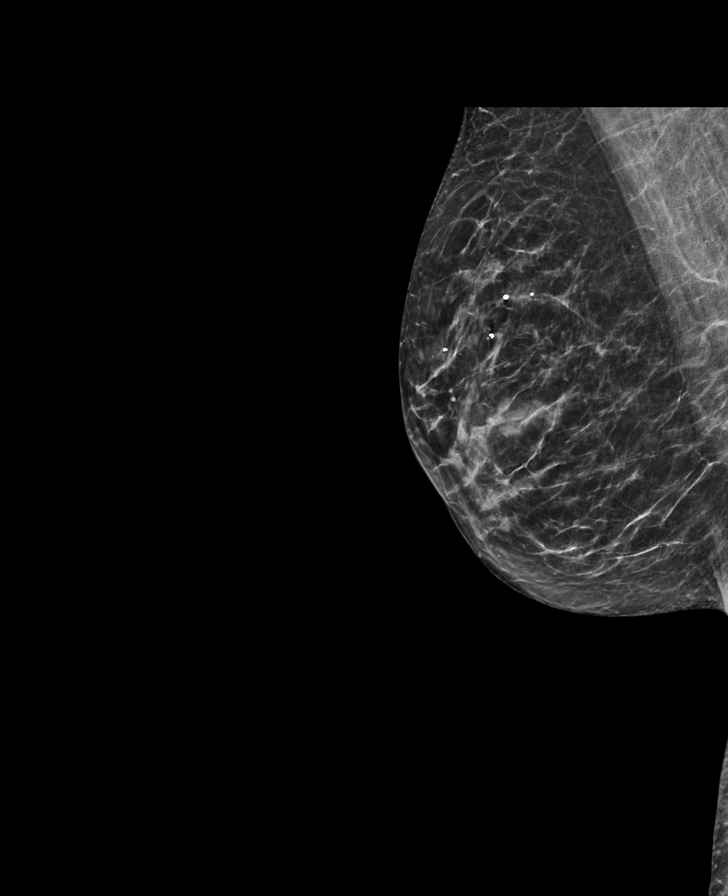

[R CC synth-2D]
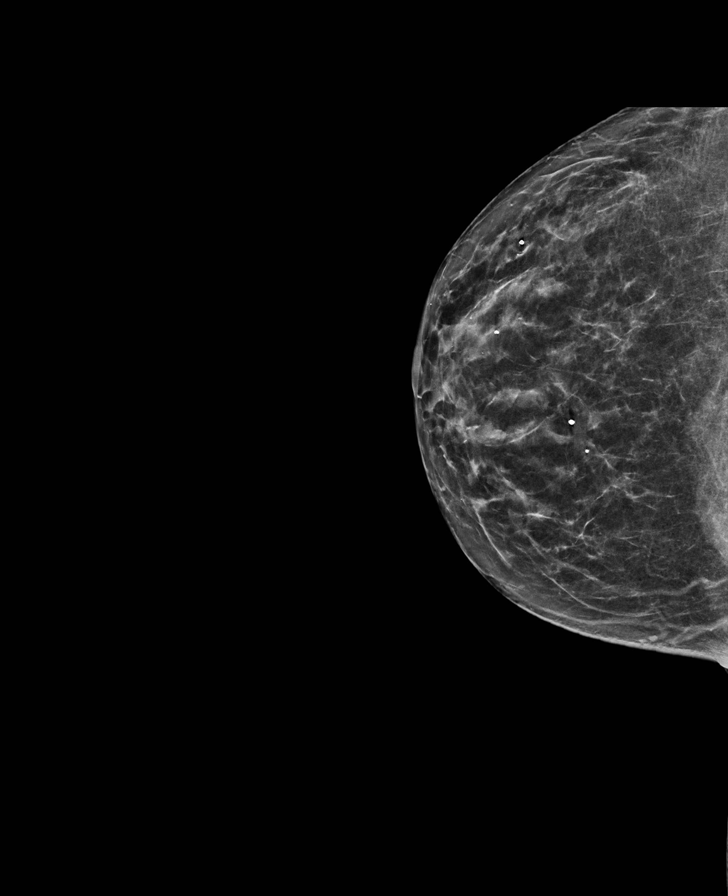

[L CC synth-2D]
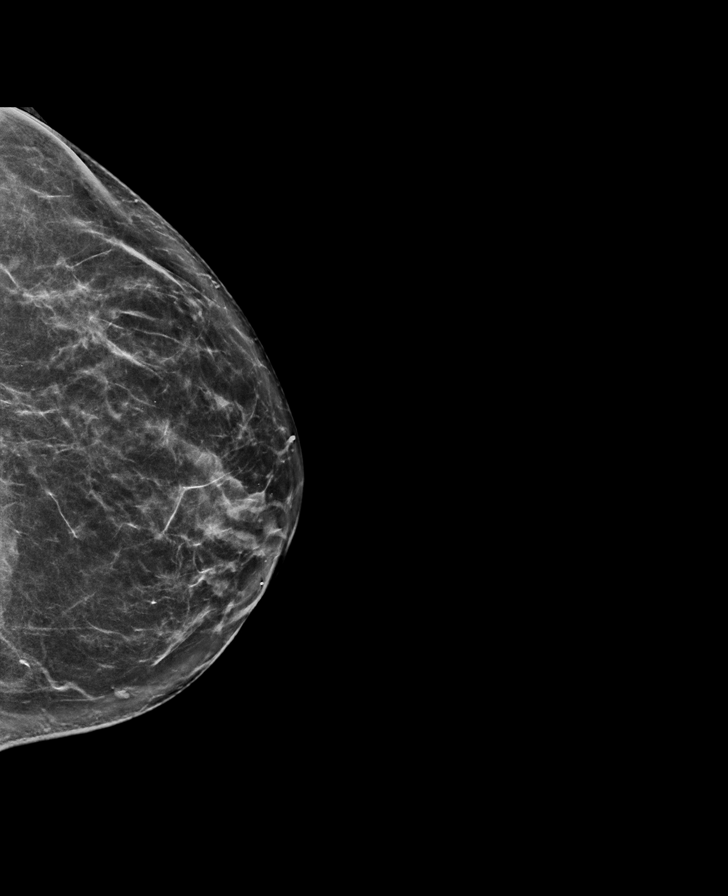

[R CC tomo · tomo slice 33/64.0]
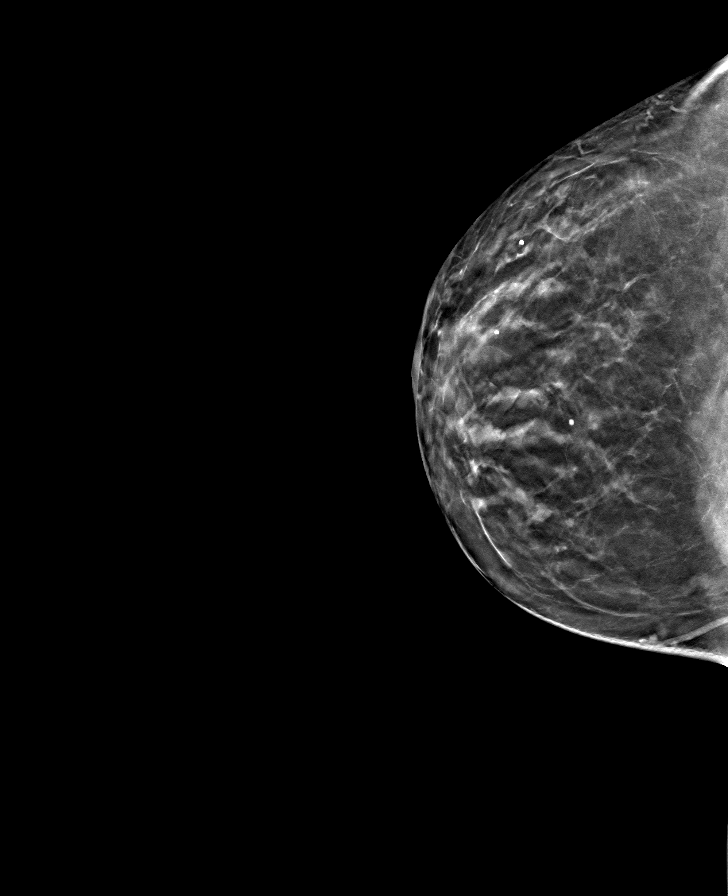

[R MLO tomo · tomo slice 33/64.0]
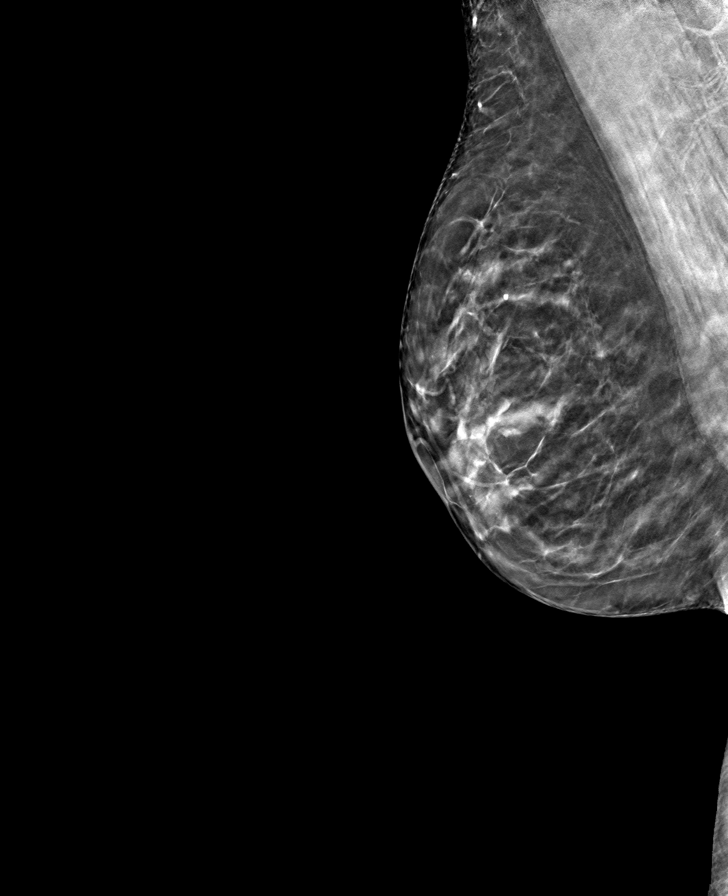

[L MLO tomo · tomo slice 35/70.0]
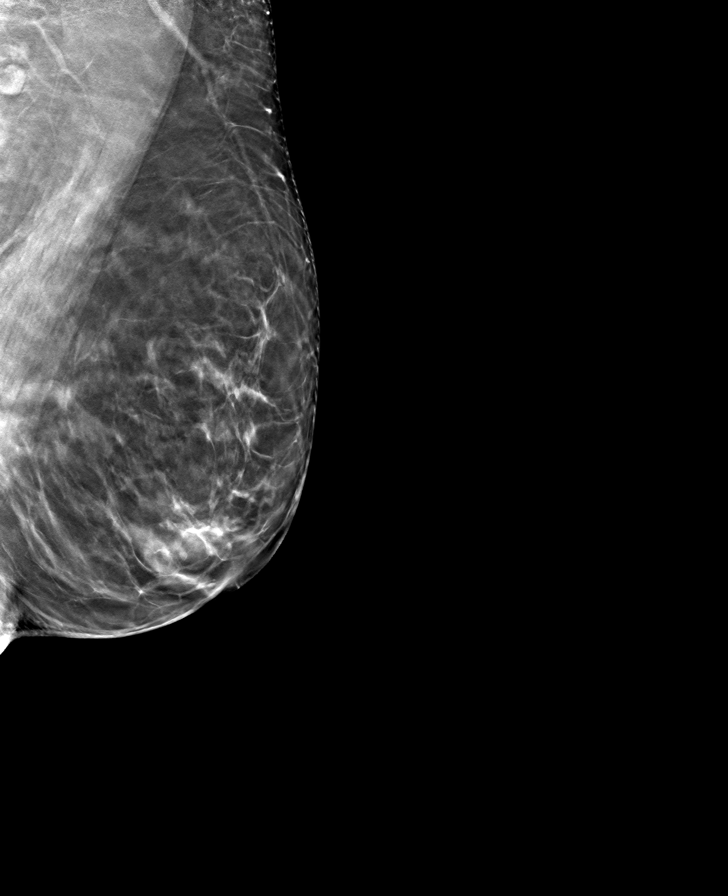

[L CC tomo · tomo slice 37/73.0]
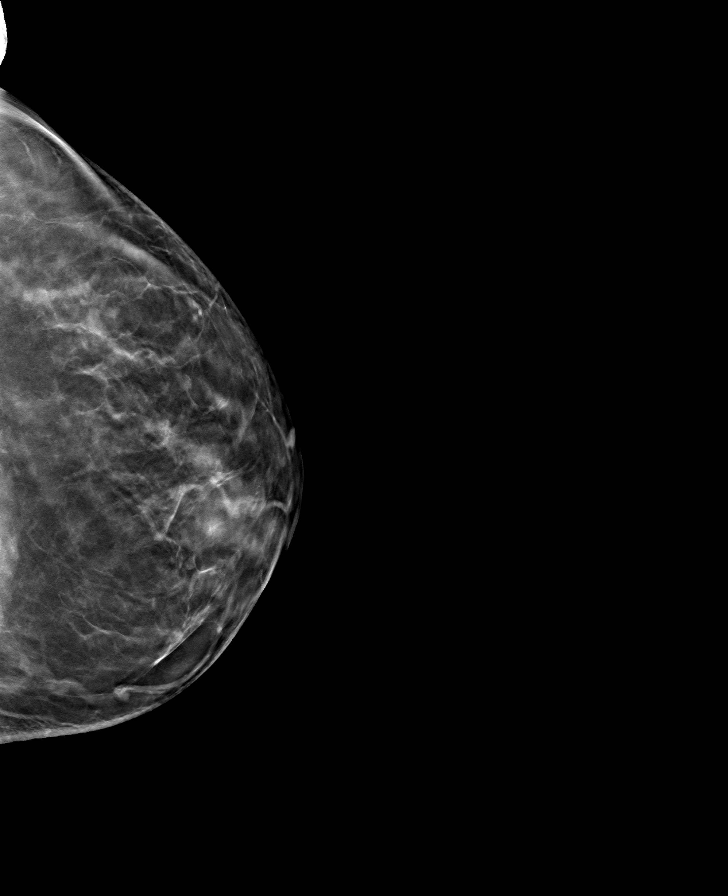

[8 of 24 positions shown; findings below may reference images not displayed]

ACR Breast Density Category b: There are scattered areas of
fibroglandular density.
FINDINGS: There are no findings suspicious for malignancy.
IMPRESSION: No mammographic evidence of malignancy. A result letter of this
screening mammogram will be mailed directly to the patient.

RECOMMENDATION:
Screening mammogram in one year. (Code:51-O-LD2)

BI-RADS CATEGORY  1: Negative.

## 2021-08-21 ENCOUNTER — Ambulatory Visit (INDEPENDENT_AMBULATORY_CARE_PROVIDER_SITE_OTHER): Payer: Medicare Other

## 2021-08-21 ENCOUNTER — Telehealth: Payer: Self-pay

## 2021-08-21 VITALS — Ht 66.0 in | Wt 167.0 lb

## 2021-08-21 DIAGNOSIS — Z0000 Encounter for general adult medical examination without abnormal findings: Secondary | ICD-10-CM

## 2021-08-21 NOTE — Progress Notes (Signed)
I have reviewed this visit and agree with the documentation.   

## 2021-08-21 NOTE — Telephone Encounter (Signed)
I did an annual wellness visit with patient today. Patient reports continued constipation and has noticed blood when she wipes. Patient reports the blood is "sporadic." Patient denies a significant amount and is unsure if she has a hemorrhoid. Colonoscopy due in June. Patient denies any feelings of light headedness or feeling faint.  Patient scheduled for 2/22 with PCP. Red flags discussed with patient.

## 2021-08-21 NOTE — Patient Instructions (Addendum)
You spoke to Jean Williams, [place] over the phone for your annual wellness visit.  We discussed goals:   Goals      DIET - REDUCE SUGAR INTAKE     Reduce sodas. Patient reports drinking ~3 sodas per day (Sun Drop.) Patient reports she would like to cut back due to dental concerns.        We also discussed recommended health maintenance. As discussed, you are due for: Health Maintenance  Topic Date Due   Zoster Vaccines- Shingrix (1 of 2) Never done   COVID-19 Vaccine (3 - Moderna risk series) 05/29/2020   COLONOSCOPY (Pts 45-34yrs Insurance coverage will need to be confirmed)  12/29/2021   PAP SMEAR-Modifier  04/06/2022   MAMMOGRAM  08/17/2023   TETANUS/TDAP  04/13/2028   INFLUENZA VACCINE  Completed   Hepatitis C Screening  Completed   HIV Screening  Completed   HPV VACCINES  Aged Out   PCP apt scheduled for 2/22 to discuss Colonoscopy. You are due for Bivalent Booster- get this at PCP apt. Eye exam scheduled for 2/25.  We also discussed smoking cessation.  1-800-QUIT-NOW  Health Maintenance, Female Adopting a healthy lifestyle and getting preventive care are important in promoting health and wellness. Ask your health care provider about: The right schedule for you to have regular tests and exams. Things you can do on your own to prevent diseases and keep yourself healthy. What should I know about diet, weight, and exercise? Eat a healthy diet  Eat a diet that includes plenty of vegetables, fruits, low-fat dairy products, and lean protein. Do not eat a lot of foods that are high in solid fats, added sugars, or sodium. Maintain a healthy weight Body mass index (BMI) is used to identify weight problems. It estimates body fat based on height and weight. Your health care provider can help determine your BMI and help you achieve or maintain a healthy weight. Get regular exercise Get regular exercise. This is one of the most important things you can do for your health. Most  adults should: Exercise for at least 150 minutes each week. The exercise should increase your heart rate and make you sweat (moderate-intensity exercise). Do strengthening exercises at least twice a week. This is in addition to the moderate-intensity exercise. Spend less time sitting. Even light physical activity can be beneficial. Watch cholesterol and blood lipids Have your blood tested for lipids and cholesterol at 62 years of age, then have this test every 5 years. Have your cholesterol levels checked more often if: Your lipid or cholesterol levels are high. You are older than 62 years of age. You are at high risk for heart disease. What should I know about cancer screening? Depending on your health history and family history, you may need to have cancer screening at various ages. This may include screening for: Breast cancer. Cervical cancer. Colorectal cancer. Skin cancer. Lung cancer. What should I know about heart disease, diabetes, and high blood pressure? Blood pressure and heart disease High blood pressure causes heart disease and increases the risk of stroke. This is more likely to develop in people who have high blood pressure readings or are overweight. Have your blood pressure checked: Every 3-5 years if you are 34-49 years of age. Every year if you are 77 years old or older. Diabetes Have regular diabetes screenings. This checks your fasting blood sugar level. Have the screening done: Once every three years after age 64 if you are at a normal weight  and have a low risk for diabetes. More often and at a younger age if you are overweight or have a high risk for diabetes. What should I know about preventing infection? Hepatitis B If you have a higher risk for hepatitis B, you should be screened for this virus. Talk with your health care provider to find out if you are at risk for hepatitis B infection. Hepatitis C Testing is recommended for: Everyone born from [birth year]  through 1965. Anyone with known risk factors for hepatitis C. Sexually transmitted infections (STIs) Get screened for STIs, including gonorrhea and chlamydia, if: You are sexually active and are younger than 62 years of age. You are older than 62 years of age and your health care provider tells you that you are at risk for this type of infection. Your sexual activity has changed since you were last screened, and you are at increased risk for chlamydia or gonorrhea. Ask your health care provider if you are at risk. Ask your health care provider about whether you are at high risk for HIV. Your health care provider may recommend a prescription medicine to help prevent HIV infection. If you choose to take medicine to prevent HIV, you should first get tested for HIV. You should then be tested every 3 months for as long as you are taking the medicine. Pregnancy If you are about to stop having your period (premenopausal) and you may become pregnant, seek counseling before you get pregnant. Take 400 to 800 micrograms (mcg) of folic acid every day if you become pregnant. Ask for birth control (contraception) if you want to prevent pregnancy. Osteoporosis and menopause Osteoporosis is a disease in which the bones lose minerals and strength with aging. This can result in bone fractures. If you are 60 years old or older, or if you are at risk for osteoporosis and fractures, ask your health care provider if you should: Be screened for bone loss. Take a calcium or vitamin D supplement to lower your risk of fractures. Be given hormone replacement therapy (HRT) to treat symptoms of menopause. Follow these instructions at home: Alcohol use Do not drink alcohol if: Your health care provider tells you not to drink. You are pregnant, may be pregnant, or are planning to become pregnant. If you drink alcohol: Limit how much you have to: 0-1 drink a day. Know how much alcohol is in your drink. In the [place] one  drink equals one 12 oz bottle of beer (355 mL), one 5 oz glass of wine (148 mL), or one 1 oz glass of hard liquor (44 mL). Lifestyle Do not use any products that contain nicotine or tobacco. These products include cigarettes, chewing tobacco, and vaping devices, such as e-cigarettes. If you need help quitting, ask your health care provider. Do not use street drugs. Do not share needles. Ask your health care provider for help if you need support or information about quitting drugs. General instructions Schedule regular health, dental, and eye exams. Stay current with your vaccines. Tell your health care provider if: You often feel depressed. You have ever been abused or do not feel safe at home. Summary Adopting a healthy lifestyle and getting preventive care are important in promoting health and wellness. Follow your health care provider's instructions about healthy diet, exercising, and getting tested or screened for diseases. Follow your health care provider's instructions on monitoring your cholesterol and blood pressure. This information is not intended to replace advice given to you by your health care provider.  Make sure you discuss any questions you have with your health care provider. Document Revised: 11/12/2020 Document Reviewed: 11/12/2020 Elsevier Patient Education  2022 [employer] Prevention in the Home, Adult Falls can cause injuries and can happen to people of all ages. There are many things you can do to make your home safe and to help prevent falls. Ask for help when making these changes. What actions can I take to prevent falls? General Instructions Use good lighting in all rooms. Replace any light bulbs that burn out. Turn on the lights in dark areas. Use night-lights. Keep items that you use often in easy-to-reach places. Lower the shelves around your home if needed. Set up your furniture so you have a clear path. Avoid moving your furniture around. Do not  have throw rugs or other things on the floor that can make you trip. Avoid walking on wet floors. If any of your floors are uneven, fix them. Add color or contrast paint or tape to clearly mark and help you see: Grab bars or handrails. First and last steps of staircases. Where the edge of each step is. If you use a stepladder: Make sure that it is fully opened. Do not climb a closed stepladder. Make sure the sides of the stepladder are locked in place. Ask someone to hold the stepladder while you use it. Know where your pets are when moving through your home. What can I do in the bathroom?   Keep the floor dry. Clean up any water on the floor right away. Remove soap buildup in the tub or shower. Use nonskid mats or decals on the floor of the tub or shower. Attach bath mats securely with double-sided, nonslip rug tape. If you need to sit down in the shower, use a plastic, nonslip stool. Install grab bars by the toilet and in the tub and shower. Do not use towel bars as grab bars. What can I do in the bedroom? Make sure that you have a light by your bed that is easy to reach. Do not use any sheets or blankets for your bed that hang to the floor. Have a firm chair with side arms that you can use for support when you get dressed. What can I do in the kitchen? Clean up any spills right away. If you need to reach something above you, use a step stool with a grab bar. Keep electrical cords out of the way. Do not use floor polish or wax that makes floors slippery. What can I do with my stairs? Do not leave any items on the stairs. Make sure that you have a light switch at the top and the bottom of the stairs. Make sure that there are handrails on both sides of the stairs. Fix handrails that are broken or loose. Install nonslip stair treads on all your stairs. Avoid having throw rugs at the top or bottom of the stairs. Choose a carpet that does not hide the edge of the steps on the  stairs. Check carpeting to make sure that it is firmly attached to the stairs. Fix carpet that is loose or worn. What can I do on the outside of my home? Use bright outdoor lighting. Fix the edges of walkways and driveways and fix any cracks. Remove anything that might make you trip as you walk through a door, such as a raised step or threshold. Trim any bushes or trees on paths to your home. Check to see if handrails are loose  or broken and that both sides of all steps have handrails. Install guardrails along the edges of any raised decks and porches. Clear paths of anything that can make you trip, such as tools or rocks. Have leaves, snow, or ice cleared regularly. Use sand or salt on paths during winter. Clean up any spills in your garage right away. This includes grease or oil spills. What other actions can I take? Wear shoes that: Have a low heel. Do not wear high heels. Have rubber bottoms. Feel good on your feet and fit well. Are closed at the toe. Do not wear open-toe sandals. Use tools that help you move around if needed. These include: Canes. Walkers. Scooters. Crutches. Review your medicines with your doctor. Some medicines can make you feel dizzy. This can increase your chance of falling. Ask your doctor what else you can do to help prevent falls. Where to find more information Centers for Disease Control and Prevention, [employer]: http://www.wolf.info/ National Institute on Aging: http://kim-miller.com/ Contact a doctor if: You are afraid of falling at home. You feel weak, drowsy, or dizzy at home. You fall at home. Summary There are many simple things that you can do to make your home safe and to help prevent falls. Ways to make your home safe include removing things that can make you trip and installing grab bars in the bathroom. Ask for help when making these changes in your home. This information is not intended to replace advice given to you by your health care provider. Make sure  you discuss any questions you have with your health care provider. Document Revised: 01/25/2020 Document Reviewed: 01/25/2020 Elsevier Patient Education  2022 [employer].  Our clinic's number is [phone]. Please call with questions or concerns about what we discussed today.

## 2021-08-21 NOTE — Progress Notes (Signed)
Subjective:   Jean Williams is a 61 y.o. female who presents for Medicare Annual (Subsequent) preventive examination.  Patient consented to have virtual visit and was identified by name and date of birth. Method of visit: Telephone  Encounter participants: Patient: Jean Williams - located at Home Nurse/Provider: Dorna Bloom - located at [facility] Others (if applicable): NA  Review of Systems: Defer to PCP.  Cardiac Risk Factors include: smoking/ tobacco exposure;dyslipidemia  Objective:   Vitals: Ht 5\' 6"  (1.676 m)    Wt 167 lb (75.8 kg)    LMP 06/10/2011    BMI 26.95 kg/m   Body mass index is 26.95 kg/m.  Advanced Directives 08/21/2021 08/17/2020 06/26/2020 05/18/2020 05/01/2020 03/02/2020 11/29/2019  Does Patient Have a Medical Advance Directive? No No No No No No No  Type of Advance Directive - - - - - - -  Copy of Healthcare Power of Attorney in Chart? - - - - - - -  Would patient like information on creating a medical advance directive? Yes (MAU/Ambulatory/Procedural Areas - Information given) No - Patient declined No - Patient declined No - Patient declined No - Patient declined No - Patient declined No - Patient declined   Patient reports having legal guardians, her sister Karena Addison and brother Elta Guadeloupe.   Tobacco Social History   Tobacco Use  Smoking Status Every Day   Packs/day: 1.00   Years: 40.00   Pack years: 40.00   Types: Cigarettes   Passive exposure: Current  Smokeless Tobacco Never  Tobacco Comments   1 PPD     Ready to quit: Not really Counseling given: Yes Tobacco comments: 1 PPD  Clinical Intake:  Pre-visit preparation completed: Yes  Pain Score: 0-No pain  How often do you need to have someone help you when you read instructions, pamphlets, or other written materials from your doctor or pharmacy?: 2 - Rarely What is the last grade level you completed in school?: GED  Past Medical History:  Diagnosis Date   Chronic constipation    Eczema     Emphysema of lung ([place])    in epic CT angio Chest-- 01-24-2018   GAD (generalized anxiety disorder)    GERD (gastroesophageal reflux disease)    History of adenomatous polyp of colon    History of basal cell carcinoma (BCC) excision    05-13-2011   s/p moh's left upper lip   History of panic attacks    Hyperlipidemia    Psoriasis    Rectal polyp    Schizophrenia ([place])    followed by [facility] , dr Andree Elk   Seasonal allergic rhinitis    Tachycardia    followed by pcp, first dx 2015, no meds for this,  last EKG in epic dated 02-09-2018   Past Surgical History:  Procedure Laterality Date   COLONOSCOPY     TRANSANAL EXCISION OF RECTAL MASS N/A 03/17/2019   Procedure: TRANSANAL EXCISION OF RECTAL POLYP;  Surgeon: Leighton Ruff, MD;  Location: [place];  Service: General;  Laterality: N/A;   Family History  Problem Relation Age of Onset   Cancer Sister    Breast cancer Sister    Cancer Maternal Grandmother    Breast cancer Maternal Grandmother    Stroke Other    Heart failure Other    Colon cancer Neg Hx    Colon polyps Neg Hx    Rectal cancer Neg Hx    Stomach cancer Neg Hx    Esophageal cancer  Neg Hx    Social History   Socioeconomic History   Marital status: Single    Spouse name: Not on file   Number of children: 1   Years of education: GED   Highest education level: GED or equivalent  Occupational History   Occupation: Conservation officer, historic buildings: [employer]    Comment: Sundays-Tuesdays-Thursdays for 4 hours  Tobacco Use   Smoking status: Every Day    Packs/day: 1.00    Years: 40.00    Pack years: 40.00    Types: Cigarettes    Passive exposure: Current   Smokeless tobacco: Never   Tobacco comments:    1 PPD  Vaping Use   Vaping Use: Never used  Substance and Sexual Activity   Alcohol use: No    Alcohol/week: 0.0 standard drinks   Drug use: Not Currently    Types: Marijuana    Comment: yrs ago   Sexual activity: Not Currently     Birth control/protection: Post-menopausal  Other Topics Concern   Not on file  Social History Narrative   Patients lives in an assisted living in [place].   Patient has legal guardians/financial- sister Karena Addison and brother Elta Guadeloupe.    Patient uses [employer] for transportation.    Patient has one daughter in [place]. Patient reports good contact, however "sporadic."    Patient works 3 days per week at [employer] and really enjoys it.    Patient is active at [facility] and has "lots" of friends.    Social Determinants of Health   Financial Resource Strain: Low Risk    Difficulty of Paying Living Expenses: Not very hard  Food Insecurity: No Food Insecurity   Worried About Charity fundraiser in the Last Year: Never true   Ran Out of Food in the Last Year: Never true  Transportation Needs: No Transportation Needs   Lack of Transportation (Medical): No   Lack of Transportation (Non-Medical): No  Physical Activity: Inactive   Days of Exercise per Week: 0 days   Minutes of Exercise per Session: 0 min  Stress: No Stress Concern Present   Feeling of Stress : Not at all  Social Connections: Moderately Integrated   Frequency of Communication with Friends and Family: More than three times a week   Frequency of Social Gatherings with Friends and Family: More than three times a week   Attends Religious Services: 1 to 4 times per year   Active Member of [employer] or Organizations: Yes   Attends Archivist Meetings: More than 4 times per year   Marital Status: Never married   Outpatient Encounter Medications as of 08/21/2021  Medication Sig   acetaminophen (TYLENOL) 325 MG tablet Take 2 tablets (650 mg total) by mouth every 6 (six) hours as needed.   atorvastatin (LIPITOR) 40 MG tablet Take 1 tablet (40 mg total) by mouth at bedtime.   betamethasone valerate ointment (VALISONE) 0.1 % Apply 1 application topically 2 (two) times daily.   carboxymethylcellul-glycerin (OPTIVE)  0.5-0.9 % ophthalmic solution Place 1 drop into both eyes 4 (four) times daily. Cannot self-administer (Patient taking differently: Place 1 drop into both eyes 4 (four) times daily. Cannot self-administer)   cloZAPine (CLOZARIL) 100 MG tablet Take 1 tablet (100 mg total) by mouth 2 (two) times daily. Take 1/2 tab every am & 3 tabs every evening (Patient taking differently: Take 100 mg by mouth 3 (three) times daily.)   docusate sodium (COLACE) 100 MG capsule Take  100 mg by mouth 2 (two) times daily.   famotidine (PEPCID) 20 MG tablet Take 1 tablet (20 mg total) by mouth 2 (two) times daily.   fluticasone (FLONASE) 50 MCG/ACT nasal spray INAHALE 2 SPRAYS INTO EACH NOSTRIL ONCE DAILY.   fluticasone (FLOVENT HFA) 110 MCG/ACT inhaler INHALE 1 PUFF BY MOUTH TWICE DAILY. RINSE MOUTH AFTER USE.   loratadine (CLARITIN) 10 MG tablet Take 1 tablet (10 mg total) by mouth daily.   Multiple Vitamins-Minerals (CERTAVITE/ANTIOXIDANTS) TABS TAKE 1 TABLET BY MOUTH ONCE DAILY. REPLACES [place] (Patient taking differently: Take 1 tablet by mouth daily.)   PARoxetine (PAXIL) 30 MG tablet TAKE 1 TABLET BY MOUTH IN THE MORNING. (Patient taking differently: Take 30 mg by mouth daily.)   Polyethylene Glycol 3350 (MIRALAX PO) Take by mouth daily.   triamcinolone ointment (KENALOG) 0.1 % Apply 1 application topically 2 (two) times daily.   albuterol (PROVENTIL HFA;VENTOLIN HFA) 108 (90 Base) MCG/ACT inhaler Inhale 1-2 puffs into the lungs every 6 (six) hours as needed for wheezing or shortness of breath. (Patient not taking: Reported on 05/01/2020)   guaiFENesin (ROBITUSSIN) 100 MG/5ML SOLN Take 5 mLs (100 mg total) by mouth every 6 (six) hours as needed for cough or to loosen phlegm. (Patient not taking: Reported on 08/21/2021)   Oyster Shell 500 MG TABS Take 1 tablet (500 mg total) by mouth 2 (two) times daily.   No facility-administered encounter medications on file as of 08/21/2021.   Activities of Daily Living In your  present state of health, do you have any difficulty performing the following activities: 08/21/2021  Hearing? N  Vision? N  Difficulty concentrating or making decisions? Y  Walking or climbing stairs? N  Dressing or bathing? N  Doing errands, shopping? N  Preparing Food and eating ? N  Using the Toilet? N  In the past six months, have you accidently leaked urine? N  Do you have problems with loss of bowel control? N  Managing your Medications? Y  Managing your Finances? Y  Housekeeping or managing your Housekeeping? N  Some recent data might be hidden   Patient Care Team: Gerrit Heck, MD as PCP - General (Family Medicine)    Assessment:   This is a routine wellness examination for [place].  Exercise Activities and Dietary recommendations Current Exercise Habits: The patient does not participate in regular exercise at present, Exercise limited by: respiratory conditions(s);psychological condition(s)   Goals      DIET - REDUCE SUGAR INTAKE     Reduce sodas. Patient reports drinking ~3 sodas per day (Sun Drop.) Patient reports she would like to cut back due to dental concerns.        Fall Risk Fall Risk  08/21/2021 11/29/2019 11/01/2019 05/26/2018 11/13/2017  Falls in the past year? 0 0 0 0 No  Number falls in past yr: 0 0 0 - -  Injury with Fall? 0 0 0 - -  Risk for fall due to : Medication side effect - - - -  Follow up Falls prevention discussed - - - -   Patient reports her medications make her "dizzy" and sometimes she has "bad" balance. Patient reports using a cane to ambulate.   Is the patient's home free of loose throw rugs in walkways, pet beds, electrical cords, etc?   yes      Grab bars in the bathroom? yes      Handrails on the stairs?   yes      Adequate lighting?  yes  Patient rating of health (0-10) scale: 9  Depression Screen PHQ 2/9 Scores 08/21/2021 11/01/2020 08/17/2020 06/26/2020  PHQ - 2 Score 2 2 1 2   PHQ- 9 Score 14 16 12 17   Exception  Documentation - - - -    Cognitive Function 6CIT Screen 08/21/2021  What Year? 0 points  What month? 0 points  What time? 0 points  Count back from 20 0 points  Months in reverse 2 points  Repeat phrase 2 points  Total Score 4   Immunization History  Administered Date(s) Administered   H1N1 06/08/2008   Hepatitis B, adult 03/02/2020   Influenza Split 05/01/2011, 04/20/2012, 04/03/2017   Influenza Whole 04/20/2007, 03/28/2008, 04/18/2009, 04/15/2010   Influenza,inj,Quad PF,6+ Mos 03/25/2013, 05/29/2014, 04/23/2015, 03/11/2016, 04/03/2017, 04/13/2018, 03/24/2019, 04/06/2019, 04/17/2020, 06/26/2020, 06/26/2021   Moderna SARS-COV2 Booster Vaccination 05/01/2020   Moderna Sars-Covid-2 Vaccination 07/15/2019, 08/12/2019   PPD Test 10/07/2010, 02/05/2011, 06/25/2018, 07/13/2018, 06/26/2020, 10/15/2020   Pneumococcal Polysaccharide-23 06/06/1993, 06/28/2018   Td 01/10/2006   Tdap 04/13/2018   Qualifies for Shingles Vaccine? Yes   Shingrix Completed: No, Education has been provided regarding the importance of this vaccine. Advised may receive this vaccine at local pharmacy or Health Dept. Aware to provide a copy of the vaccination record if obtained from local pharmacy or Health Dept. Verbalized acceptance and understanding.  Screening Tests Health Maintenance  Topic Date Due   Zoster Vaccines- Shingrix (1 of 2) Never done   COVID-19 Vaccine (3 - Moderna risk series) 05/29/2020   COLONOSCOPY (Pts 45-63yrs Insurance coverage will need to be confirmed)  12/29/2021   PAP SMEAR-Modifier  04/06/2022   MAMMOGRAM  08/17/2023   TETANUS/TDAP  04/13/2028   INFLUENZA VACCINE  Completed   Hepatitis C Screening  Completed   HIV Screening  Completed   HPV VACCINES  Aged Out   Cancer Screenings: Lung: Low Dose CT Chest recommended if Age 39-80 years, 20 pack-year currently smoking OR have quit w/in 15years. Patient does qualify. CT chest negative for lung cancer 10/21. Patient due for repeat  screening.  Breast:  Up to date on Mammogram? Yes   Up to date of Bone Density/Dexa? NA Colorectal: UTD- Due in June. Apt made for 2/22 with PCP for referral if needed. Cervical: last pap- 04/06/2017-NIL HPV Neg   Additional Screenings: Hepatitis C Screening: Completed  HIV Screening: Completed   Plan:  PCP apt scheduled for 2/22 to discuss Colonoscopy. You are due for Bivalent Booster- get this at PCP apt. Eye exam scheduled for 2/25.  I have personally reviewed and noted the following in the patients chart:   Medical and social history Use of alcohol, tobacco or illicit drugs  Current medications and supplements Functional ability and status Nutritional status Physical activity Advanced directives List of other physicians Hospitalizations, surgeries, and ER visits in previous 12 months Vitals Screenings to include cognitive, depression, and falls Referrals and appointments  In addition, I have reviewed and discussed with patient certain preventive protocols, quality metrics, and best practice recommendations. A written personalized care plan for preventive services as well as general preventive health recommendations were provided to patient.  This visit was conducted virtually in the setting of the [place] pandemic.    Dorna Bloom, [place]  08/21/2021

## 2021-08-22 ENCOUNTER — Telehealth: Payer: Self-pay | Admitting: Gastroenterology

## 2021-08-22 NOTE — Telephone Encounter (Signed)
Patient is due for colonoscopy in June 2023.  Records faxed to Alphonzo Dublin at [facility]

## 2021-08-22 NOTE — Telephone Encounter (Signed)
Patient's facility, [facility], needs some type of documentation faxed to them that shows her colon recall is not due until July 2023.  If you could please fax that information to:  [facility] ATTN:  Alphonzo Dublin Fax #:  [phone]  Thank you.

## 2021-08-27 NOTE — Progress Notes (Signed)
° ° °  SUBJECTIVE:   CHIEF COMPLAINT / HPI:   Bleeding per rectum Says she has been having issues with some bleeding from her rectum when wiping after going to the bathroom.  She has been very constipated and been "picking" at her rectum to help with the stools come out.  Says that it looks like "menstrual blood" but denies any vaginal bleeding.  Says that she does not have any dark tarry/sticky stools.  Says that she does not soak through her underwear with blood.  Denies any shortness of breath, chest pain, dizziness.  Has had colonoscopy 12/30/2018 with a rectal polyp and recommended follow-up in 3 years since it had showed high-grade dysplasia/carcinoma in situ.  Last had a BM yesterday which was hard and uncomfortable.  Denies any abdominal pain  Elevated Blood Pressure Readings 145/127 on the right arm 134/123 on left arm which then came down to 100/77 at end of visit.  Denies any alcohol or drug use currently.  Sinus tachycardia Patient had initial pulse of 130 in the room which came down to 127 and then 117 on EKG.  EKG showed sinus tachycardia.  Patient denied any chest pain or shortness of breath.  Seems to be a chronic issue for patient as well.  PERTINENT  PMH / PSH: HLD, asthma, schizophrenia  OBJECTIVE:   BP 100/77    Pulse (!) 127    Ht 5\' 6"  (1.676 m)    Wt 164 lb (74.4 kg)    LMP 06/10/2011    SpO2 93%    BMI 26.47 kg/m    General: NAD, stuttering somewhat with words Head: Normocephalic atraumatic CV: Tachycardic, normal rhythm Respiratory:no increased work of breathing Abdomen: Soft, non-tender, non-distended Rectum: Anoscopy performed-no bleeding hemorrhoids visualized, small anal fissure present that was non bleeding laterally. No frank blood seen. Chaperone: CMA Tashira   ASSESSMENT/PLAN:   Blood in stool No alarm symptoms for anemia currently.  Likely related to anal fissure seen on examination as well as patient has been constipating and "picking" at her rectum to  get the stool out.  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 -CBC -GI referral for diagnostic colonoscopy -MiraLAX 17 g twice daily -F/u in one month  Tachycardia 130>127>117. Chronic issue and asymptomatic. EKG performed in clinic without Afib and patient was in normal sinus rhythm.  -Monitor HR   Elevated blood pressure reading 145/127 on the right arm 134/123 on left arm which then came down to 100/77 at end of visit. -Monitor at next visit   Gerrit Heck, MD [place]

## 2021-08-27 NOTE — Patient Instructions (Addendum)
It was great to see you! Thank you for allowing me to participate in your care!   I recommend that you always bring your medications to each appointment as this makes it easy to ensure we are on the correct medications and helps [place] not miss when refills are needed.  Our plans for today:  -We looked through a scope and you did not have any hemmorhoids but a small cut that was not bleeding, this could be worsened with picking that area and your constipation. I have prescribed you some miralax to take as well  -We will look at your CBC to see if your blood levels are okay. - If you develop shortness of breath, chest pain or weakness please return to care -If you are having a lot of blood in your toilet please go to the ER to be evaluated - I have referred you to GI for another colonoscopy -Please take miralax twice a daily to help with your stools -Please follow up in a month to ensure this is getting better -We got an EKG for your heart rate which showed a sinus rhythm -Please keep an eye on your heart rates at home to bring in to clinic at your next visit  We are checking some labs today, I will call you if they are abnormal will send you a MyChart message or a letter if they are normal.  If you do not hear about your labs in the next 2 weeks please let us know.  Take care and seek immediate care sooner if you develop any concerns. Please remember to show up 15 minutes before your scheduled appointment time!  Gerrit Heck, MD [place]

## 2021-08-28 ENCOUNTER — Other Ambulatory Visit: Payer: Self-pay

## 2021-08-28 ENCOUNTER — Ambulatory Visit (INDEPENDENT_AMBULATORY_CARE_PROVIDER_SITE_OTHER): Payer: Medicare Other | Admitting: Student

## 2021-08-28 ENCOUNTER — Encounter: Payer: Self-pay | Admitting: Student

## 2021-08-28 ENCOUNTER — Ambulatory Visit (INDEPENDENT_AMBULATORY_CARE_PROVIDER_SITE_OTHER): Payer: Medicare Other

## 2021-08-28 ENCOUNTER — Telehealth: Payer: Self-pay | Admitting: Gastroenterology

## 2021-08-28 VITALS — BP 100/77 | HR 127 | Ht 66.0 in | Wt 164.0 lb

## 2021-08-28 DIAGNOSIS — R030 Elevated blood-pressure reading, without diagnosis of hypertension: Secondary | ICD-10-CM

## 2021-08-28 DIAGNOSIS — K921 Melena: Secondary | ICD-10-CM | POA: Diagnosis not present

## 2021-08-28 DIAGNOSIS — R000 Tachycardia, unspecified: Secondary | ICD-10-CM | POA: Diagnosis not present

## 2021-08-28 DIAGNOSIS — Z23 Encounter for immunization: Secondary | ICD-10-CM | POA: Diagnosis not present

## 2021-08-28 HISTORY — DX: Elevated blood-pressure reading, without diagnosis of hypertension: R03.0

## 2021-08-28 HISTORY — DX: Melena: K92.1

## 2021-08-28 MED ORDER — POLYETHYLENE GLYCOL 3350 17 GM/SCOOP PO POWD: 17.0000 g | Freq: Two times a day (BID) | ORAL | 1 refills | Status: DC | PRN

## 2021-08-28 NOTE — Telephone Encounter (Signed)
Called Amma at [phone]. Gave her the name of the surgeon and date of surgery 03-2019. She will follow up with Dr. Marcello Moores at [place]. She will be due for colonoscopy in 12-2021

## 2021-08-28 NOTE — Assessment & Plan Note (Addendum)
[phone]. Chronic issue and asymptomatic. EKG performed in clinic without Afib and patient was in normal sinus rhythm.  -Monitor HR

## 2021-08-28 NOTE — Assessment & Plan Note (Signed)
No alarm symptoms for anemia currently.  Likely related to anal fissure seen on examination as well as patient has been constipating and "picking" at her rectum to get the stool out.  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 -CBC -GI referral for diagnostic colonoscopy -MiraLAX 17 g twice daily -F/u in one month

## 2021-08-28 NOTE — Assessment & Plan Note (Signed)
145/127 on the right arm 134/123 on left arm which then came down to 100/77 at end of visit. -Monitor at next visit

## 2021-08-28 NOTE — Telephone Encounter (Signed)
Inbound call from [place] at [facility] calling in regards to patient, she states that in July of 2020 that patient had a Colorectal Surgery and is seeking advice if we were the office that sent the referral to [facility].   I advised her to call [facility] and ask them if they had any records on her and the procedure. Amma wanted me to send a message and ask if Dr. Caron Presume has those records.

## 2021-08-28 NOTE — Assessment & Plan Note (Signed)
>>  ASSESSMENT AND PLAN FOR TACHYCARDIA WRITTEN ON 08/28/2021 10:46 AM BY JAGADISH, MAYURI, MD  [phone]. Chronic issue and asymptomatic. EKG performed in clinic without Afib and patient was in normal sinus rhythm.  -Monitor HR

## 2021-08-29 ENCOUNTER — Encounter: Payer: Self-pay | Admitting: Student

## 2021-08-29 ENCOUNTER — Telehealth: Payer: Self-pay

## 2021-08-29 LAB — CBC
Hematocrit: 41.9 % (ref 34.0–46.6)
Hemoglobin: 14.4 g/dL (ref 11.1–15.9)
MCH: 31.1 pg (ref 26.6–33.0)
MCHC: 34.4 g/dL (ref 31.5–35.7)
MCV: 91 fL (ref 79–97)
Platelets: 303 10*3/uL (ref 150–450)
RBC: 4.63 x10E6/uL (ref 3.77–5.28)
RDW: 13 % (ref 11.7–15.4)
WBC: 7.4 10*3/uL (ref 3.4–10.8)

## 2021-08-29 NOTE — Telephone Encounter (Signed)
Patient LVM on nurse line requesting returned phone call from nurse.   Attempted to return call to patient. Patient provided number for [facility], spoke with staff. Patient is currently at work right now and they will give her message that I was returning her phone call.   Talbot Grumbling, RN

## 2021-09-05 NOTE — Progress Notes (Signed)
? ? ?09/06/2021 ?Jean Williams ?[id number] ?May 23, 1961 ? ? ?ASSESSMENT AND PLAN:  ? ?Rectal bleeding ?-     hydrocortisone (ANUSOL-HC) 2.5 % rectal cream; Place 1 application rectally 2 (two) times daily as needed for hemorrhoids. ?Patient due for colonoscopy 12/2021, will schedule earlier.  ?Possible from fissure/hemorrhoids/constipation ?Declined rectal exam today, had with PCP ? ?History of adenomatous polyp of colon ?Patient due for colonoscopy 12/2021, will schedule earlier.  ?12/30/2018 colonoscopy showed 4 polyps, polypoid lesion rectal ulceration, redundant colon, small lipoma.  2 adenomatous polyps, 2 sessile serrated polyps, repeat colonoscopy 3 years given burden of polyps on last 2 examinations.   ?Patient does need 2-day prep.  ? ?Anal intraepithelial neoplasia I (AIN I) ?surgery 03/17/2019 with transanal excision of rectal polyp by Dr. Marcello Moores ? ?Schizophrenia, unspecified type ([place]) ?Has aids ? ?TOBACCO DEPENDENCE ?No CP, SOB, some wheezing ? ? ?History of Present Illness:  ?61 y.o. female  with a past medical history of fatty liver, tobacco dependence, schizophrenia, hyperlipidemia, personal history of adenomatous polyps and others listed below, known to Dr. Havery Moros returns to clinic today for evaluation of blood in stool. ? ?12/30/2018 colonoscopy showed 4 polyps, polypoid lesion rectal ulceration, redundant colon, small lipoma.  2 adenomatous polyps, 2 sessile serrated polyps, repeat colonoscopy 3 years given burden of polyps on last 2 examinations.  Patient does need 2-day prep.  Polypoid lesion at anal canal showed AIN-1 precancerous change referred to colorectal surgery for transanal excision. ?Patient had surgery 03/17/2019 with transanal excision of rectal polyp by Dr. Marcello Moores.  ?03/06/2020  echocardiogram 55 to 60% ? ?Caregiver is here with her, Helene Kelp.  ?She walks with cane.  ? ?She has BM 1-2 BM's a day, hard stool with straining, will have to do manual disimpaction for last 6 months.  ?She  has seen BRB with skin tissue, on her hands as she picks and in toilet.  ?She has not been taking anything for it, was prescribed miralax but has not started it.  ?08/28/2021 CBc without anemia/leukocytosis ?No new meds, no new supplements.  ?She is more active, has job at [employer].  ? ?Previous GI history: ? ?MM 3D SCREEN BREAST BILATERAL ? ?Result Date: 08/16/2021 ?CLINICAL DATA:  Screening. EXAM: DIGITAL SCREENING BILATERAL MAMMOGRAM WITH TOMOSYNTHESIS AND CAD TECHNIQUE: Bilateral screening digital craniocaudal and mediolateral oblique mammograms were obtained. Bilateral screening digital breast tomosynthesis was performed. The images were evaluated with computer-aided detection. COMPARISON:  Previous exam(s). ACR Breast Density Category b: There are scattered areas of fibroglandular density. FINDINGS: There are no findings suspicious for malignancy. IMPRESSION: No mammographic evidence of malignancy. A result letter of this screening mammogram will be mailed directly to the patient. RECOMMENDATION: Screening mammogram in one year. (Code:SM-B-01Y) BI-RADS CATEGORY  1: Negative. Electronically Signed   By: Ammie Ferrier M.D.   On: 08/16/2021 14:45   ? ?Current Medications:  ? ? ?Current Outpatient Medications (Cardiovascular):  ?  atorvastatin (LIPITOR) 40 MG tablet, Take 1 tablet (40 mg total) by mouth at bedtime. ? ?Current Outpatient Medications (Respiratory):  ?  albuterol (PROVENTIL HFA;VENTOLIN HFA) 108 (90 Base) MCG/ACT inhaler, Inhale 1-2 puffs into the lungs every 6 (six) hours as needed for wheezing or shortness of breath. ?  fluticasone (FLONASE) 50 MCG/ACT nasal spray, INAHALE 2 SPRAYS INTO EACH NOSTRIL ONCE DAILY. ?  fluticasone (FLOVENT HFA) 110 MCG/ACT inhaler, INHALE 1 PUFF BY MOUTH TWICE DAILY. RINSE MOUTH AFTER USE. ?  loratadine (CLARITIN) 10 MG tablet, Take 1 tablet (10 mg total) by mouth  daily. ? ?Current Outpatient Medications (Analgesics):  ?  acetaminophen (TYLENOL) 325 MG tablet, Take 2  tablets (650 mg total) by mouth every 6 (six) hours as needed. ? ? ?Current Outpatient Medications (Other):  ?  betamethasone valerate ointment (VALISONE) 0.1 %, Apply 1 application topically 2 (two) times daily. ?  carboxymethylcellul-glycerin (OPTIVE) 0.5-0.9 % ophthalmic solution, Place 1 drop into both eyes 4 (four) times daily. Cannot self-administer (Patient taking differently: Place 1 drop into both eyes 4 (four) times daily. Cannot self-administer) ?  cloZAPine (CLOZARIL) 100 MG tablet, Take 1 tablet (100 mg total) by mouth 2 (two) times daily. Take 1/2 tab every am & 3 tabs every evening (Patient taking differently: Take 300 mg by mouth at bedtime.) ?  docusate sodium (COLACE) 100 MG capsule, Take 100 mg by mouth 2 (two) times daily. ?  famotidine (PEPCID) 20 MG tablet, Take 1 tablet (20 mg total) by mouth 2 (two) times daily. ?  hydrocortisone (ANUSOL-HC) 2.5 % rectal cream, Place 1 application rectally 2 (two) times daily as needed for hemorrhoids. ?  Multiple Vitamins-Minerals (CERTAVITE/ANTIOXIDANTS) TABS, TAKE 1 TABLET BY MOUTH ONCE DAILY. REPLACES [place] (Patient taking differently: Take 1 tablet by mouth daily.) ?  Oyster Shell 500 MG TABS, Take 1 tablet (500 mg total) by mouth 2 (two) times daily. ?  PARoxetine (PAXIL) 30 MG tablet, TAKE 1 TABLET BY MOUTH IN THE MORNING. (Patient taking differently: Take 30 mg by mouth daily.) ?  Polyethylene Glycol 3350 (MIRALAX PO), Take by mouth daily. ?  polyethylene glycol powder (GLYCOLAX/MIRALAX) 17 GM/SCOOP powder, Take 17 g by mouth 2 (two) times daily as needed. ?  triamcinolone ointment (KENALOG) 0.1 %, Apply 1 application topically 2 (two) times daily. ? ?Surgical History:  ?She  has a past surgical history that includes Colonoscopy and Transanal excision of rectal mass (N/A, 03/17/2019). ?Family History:  ?Her family history includes Breast cancer in her maternal grandmother and sister; Cancer in her maternal grandmother and sister; Heart failure in  an other family member; Stroke in an other family member. ?Social History:  ? reports that she has been smoking cigarettes. She has a 40.00 pack-year smoking history. She has been exposed to tobacco smoke. She has never used smokeless tobacco. She reports that she does not currently use drugs after having used the following drugs: Marijuana. She reports that she does not drink alcohol. ? ?Current Medications, Allergies, Past Medical History, Past Surgical History, Family History and Social History were reviewed in [employer] record. ? ?Physical Exam: ?BP (!) 84/60 (BP Location: Left Arm, Patient Position: Sitting, Cuff Size: Normal)   Pulse (!) 112   Ht 5\' 7"  (1.702 m) Comment: height measured without shoes  Wt 164 lb 8 oz (74.6 kg)   LMP 06/10/2011   BMI 25.76 kg/m?  ?General:   Pleasant, well developed female in no acute distress ?Eyes: sclerae anicteric,conjunctive pink  ?Heart:  regular rate and rhythm ?Pulm: Clear anteriorly; no wheezing ?Abdomen:  Soft, Obese AB, skin exam normal, Normal bowel sounds. mild tenderness in the LLQ. Without guarding and Without rebound, without hepatomegaly. ?Rectal: declines had with PCP, showed fissures.  ?Extremities:  Without edema. Peripheral pulses intact.  ?Neurologic:  Alert and  oriented x4;  grossly normal neurologically. ?Skin:   Dry and intact without significant lesions or rashes. ?Psychiatric: Demonstrates good judgement and reason without abnormal affect or behaviors. ? ?Vladimir Crofts, PA-C ?09/06/21 ?

## 2021-09-06 ENCOUNTER — Encounter: Payer: Self-pay | Admitting: Physician Assistant

## 2021-09-06 ENCOUNTER — Ambulatory Visit (INDEPENDENT_AMBULATORY_CARE_PROVIDER_SITE_OTHER): Payer: Medicare Other | Admitting: Physician Assistant

## 2021-09-06 VITALS — BP 84/60 | HR 112 | Ht 67.0 in | Wt 164.5 lb

## 2021-09-06 DIAGNOSIS — F209 Schizophrenia, unspecified: Secondary | ICD-10-CM

## 2021-09-06 DIAGNOSIS — F17200 Nicotine dependence, unspecified, uncomplicated: Secondary | ICD-10-CM

## 2021-09-06 DIAGNOSIS — K6282 Dysplasia of anus: Secondary | ICD-10-CM | POA: Diagnosis not present

## 2021-09-06 DIAGNOSIS — Z86010 Personal history of colonic polyps: Secondary | ICD-10-CM | POA: Diagnosis not present

## 2021-09-06 DIAGNOSIS — K625 Hemorrhage of anus and rectum: Secondary | ICD-10-CM

## 2021-09-06 MED ORDER — HYDROCORTISONE (PERIANAL) 2.5 % EX CREA: 1.0000 "application " | TOPICAL_CREAM | Freq: Two times a day (BID) | CUTANEOUS | 0 refills | Status: DC | PRN

## 2021-09-06 NOTE — Patient Instructions (Addendum)
If you are age 62 or older, your body mass index should be between 23-30. Your Body mass index is 25.76 kg/m?Marland Kitchen If this is out of the aforementioned range listed, please consider follow up with your Primary Care Provider. ? ?If you are age 66 or younger, your body mass index should be between 19-25. Your Body mass index is 25.76 kg/m?Marland Kitchen If this is out of the aformentioned range listed, please consider follow up with your Primary Care Provider.  ? ?________________________________________________________ ? ?The [facility] providers would like to encourage you to use [facility] to communicate with providers for non-urgent requests or questions.  Due to long hold times on the telephone, sending your provider a message by [facility] may be a faster and more efficient way to get a response.  Please allow 48 business hours for a response.  Please remember that this is for non-urgent requests.  ?_______________________________________________________ ? ?You have been scheduled for a colonoscopy. Please follow written instructions given to you at your visit today.  ?Please pick up your prep supplies at the pharmacy within the next 1-3 days. ?If you use inhalers (even only as needed), please bring them with you on the day of your procedure. ? ? ?Miralax is an osmotic laxative.  ?It only brings more water into the stool.  ?This is safe to take daily.  ?Can take up to 17 gram of miralax twice a day.  ?Mix with juice or coffee.  ?Start 1 capful at night for 3-4 days and reassess your response in 3-4 days.  ?You can increase and decrease the dose based on your response.  ?Remember, it can take up to 3-4 days to take effect OR for the effects to wear off.  ? ?I often pair this with benefiber in the morning to help assure the stool is not too loose.  ? ?If the hemorrhoid suppository sent in is too expensive you can do this over the counter trick.  ?Apply a pea size amount of over the counter anusol cream to the tip of an over the counter  PrepH suppository and insert rectally once every night for at least 7 nights.  ? ?Anal Fissure, Adult ? ?An anal fissure is a small tear or crack in the skin around the anus. Bleeding from a fissure usually stops on its own within a few minutes. However, bleeding will often occur again with each bowel movement until the crack heals. ?CAUSES ?This condition may be caused by: ?Passing large, hard stool (feces). ?Frequent diarrhea. ?Constipation. ?Inflammatory bowel disease (Crohn disease or ulcerative colitis). ?Infections. ?Anal sex. ?SYMPTOMS ?Symptoms of this condition include: ?Bleeding from the rectum. ?Small amounts of blood seen on your stool, on toilet paper, or in the toilet after a bowel movement. ?Painful bowel movements. ?Itching or irritation around the anus. ?DIAGNOSIS  ?A health care provider may diagnose this condition by closely examining the anal area. An anal fissure can usually be seen with careful inspection. In some cases, a rectal exam may be performed, or a short tube (anoscope) may be used to examine the anal canal. ?TREATMENT ?Treatment for this condition may include: ?Taking steps to avoid constipation. This may include making changes to your diet, such as increasing your intake of fiber or fluid. ?Taking fiber supplements. These supplements can soften your stool to help make bowel movements easier. Your health care provider may also prescribe a stool softener if your stool is often hard. ?Taking sitz baths. This may help to heal the tear. ?Using medicated  creams or ointments. These may be prescribed to lessen discomfort. ?HOME CARE INSTRUCTIONS ?Eating and Drinking ?Avoid foods that may be constipating, such as bananas and dairy products. ?Drink enough fluid to keep your urine clear or pale yellow. ?Maintain a diet that is high in fruits, whole grains, and vegetables. ?General Instructions ?Keep the anal area as clean and dry as possible. ?Take sitz baths as told by your health care  provider. Do not use soap in the sitz baths. ?Take over-the-counter and prescription medicines only as told by your health care provider. ?Use creams or ointments only as told by your health care provider. ?Keep all follow-up visits as told by your health care provider. This is important. ?SEEK MEDICAL CARE IF: ?You have more bleeding. ?You have a fever. ?You have diarrhea that is mixed with blood. ?You continue to have pain. ?Your problem is getting worse rather than better. ?  ?This information is not intended to replace advice given to you by your health care provider. Make sure you discuss any questions you have with your health care provider. ?  ?Document Released: 06/23/2005 Document Revised: 03/14/2015 Document Reviewed: 09/18/2014 ?Elsevier Interactive Patient Education ?2016 [place]. ? ? ? ?

## 2021-09-06 NOTE — Progress Notes (Signed)
Agree with assessment and plan as outlined.  

## 2021-10-20 ENCOUNTER — Encounter: Payer: Self-pay | Admitting: Certified Registered Nurse Anesthetist

## 2021-10-28 ENCOUNTER — Encounter: Payer: Self-pay | Admitting: Gastroenterology

## 2021-10-28 ENCOUNTER — Ambulatory Visit (AMBULATORY_SURGERY_CENTER): Payer: Medicare Other | Admitting: Gastroenterology

## 2021-10-28 VITALS — BP 157/96 | HR 81 | Temp 97.5°F | Resp 16 | Ht 67.0 in | Wt 164.0 lb

## 2021-10-28 DIAGNOSIS — K6282 Dysplasia of anus: Secondary | ICD-10-CM | POA: Diagnosis not present

## 2021-10-28 DIAGNOSIS — K635 Polyp of colon: Secondary | ICD-10-CM | POA: Diagnosis not present

## 2021-10-28 DIAGNOSIS — Z86010 Personal history of colonic polyps: Secondary | ICD-10-CM

## 2021-10-28 DIAGNOSIS — K621 Rectal polyp: Secondary | ICD-10-CM

## 2021-10-28 DIAGNOSIS — K625 Hemorrhage of anus and rectum: Secondary | ICD-10-CM

## 2021-10-28 DIAGNOSIS — K6389 Other specified diseases of intestine: Secondary | ICD-10-CM

## 2021-10-28 DIAGNOSIS — K629 Disease of anus and rectum, unspecified: Secondary | ICD-10-CM

## 2021-10-28 DIAGNOSIS — D125 Benign neoplasm of sigmoid colon: Secondary | ICD-10-CM

## 2021-10-28 MED ORDER — SODIUM CHLORIDE 0.9 % IV SOLN: 500.0000 mL | Freq: Once | INTRAVENOUS | Status: DC

## 2021-10-28 NOTE — Op Note (Signed)
[place] ?Patient Name: Jean Williams ?Procedure Date: 10/28/2021 10:36 AM ?MRN: [id number] ?Endoscopist: Carlota Raspberry. Havery Moros , MD ?Age: 61 ?Referring MD:  ?Date of Birth: 03/02/1961 ?Gender: Female ?Account #: [id number] ?Procedure:                Colonoscopy ?Indications:              High risk colon cancer surveillance: Personal  ?                          history of colonic polyps, history of AIN with  ?                          surgery in the past, history of Rectal bleeding ?Medicines:                Monitored Anesthesia Care ?Procedure:                Pre-Anesthesia Assessment: ?                          - Prior to the procedure, a History and Physical  ?                          was performed, and patient medications and  ?                          allergies were reviewed. The patient's tolerance of  ?                          previous anesthesia was also reviewed. The risks  ?                          and benefits of the procedure and the sedation  ?                          options and risks were discussed with the patient.  ?                          All questions were answered, and informed consent  ?                          was obtained. Prior Anticoagulants: The patient has  ?                          taken no previous anticoagulant or antiplatelet  ?                          agents. ASA Grade Assessment: III - A patient with  ?                          severe systemic disease. After reviewing the risks  ?                          and benefits, the patient was deemed in  ?  satisfactory condition to undergo the procedure. ?                          After obtaining informed consent, the colonoscope  ?                          was passed under direct vision. Throughout the  ?                          procedure, the patient's blood pressure, pulse, and  ?                          oxygen saturations were monitored continuously. The  ?                          [id number]-HQ190L  Colonoscope was introduced through the  ?                          anus and advanced to the the cecum, identified by  ?                          appendiceal orifice and ileocecal valve. The  ?                          colonoscopy was performed without difficulty. The  ?                          patient tolerated the procedure well. The quality  ?                          of the bowel preparation was adequate. The  ?                          ileocecal valve, appendiceal orifice, and rectum  ?                          were photographed. ?Scope In: 10:48:06 AM ?Scope Out: 11:19:46 AM ?Scope Withdrawal Time: 0 hours 21 minutes 30 seconds  ?Total Procedure Duration: 0 hours 31 minutes 40 seconds  ?Findings:                 The perianal and digital rectal examinations were  ?                          normal. ?                          A 3 mm polyp was found in the sigmoid colon. The  ?                          polyp was sessile. The polyp was removed with a  ?                          cold snare. Resection and retrieval were complete. ?  A diminutive polyp was found in the rectum. The  ?                          polyp was sessile. The polyp was removed with a  ?                          cold snare. Resection and retrieval were complete. ?                          Anal papilla(e) were hypertrophied. Biopsies were  ?                          taken with a cold forceps for histology to rule out  ?                          AIN. ?                          Internal hemorrhoids were found during retroflexion. ?                          The colon was quite tortuous. Prep was adequate but  ?                          several minutes spent lavaging the colon to achieve  ?                          adequate views ?                          There was a lipoma in the transverse colon /  ?                          hepatic flexure. exam was otherwise without  ?                          abnormality. Scarring noted in  the distal rectum. ?Complications:            No immediate complications. Estimated blood loss:  ?                          Minimal. ?Estimated Blood Loss:     Estimated blood loss was minimal. ?Impression:               - One 3 mm polyp in the sigmoid colon, removed with  ?                          a cold snare. Resected and retrieved. ?                          - One diminutive polyp in the rectum, removed with  ?                          a cold snare. Resected and retrieved. ?                          -  Benign lipoma ?                          - Anal papilla(e) were hypertrophied. Biopsied  ?                          given history of AIN. ?                          - Internal hemorrhoids. ?                          - Tortuous colon. ?                          - The examination was otherwise normal. ?Recommendation:           - Patient has a contact number available for  ?                          emergencies. The signs and symptoms of potential  ?                          delayed complications were discussed with the  ?                          patient. Return to normal activities tomorrow.  ?                          Written discharge instructions were provided to the  ?                          patient. ?                          - Resume previous diet. ?                          - Continue present medications. ?                          - Await pathology results. ?Carlota Raspberry. Havery Moros, MD ?10/28/2021 11:25:26 AM ?This report has been signed electronically. ?

## 2021-10-28 NOTE — Patient Instructions (Signed)
Impression/Recommendations: ? ?Polyp and hemorrhoid handouts given to patient. ? ?Resume previous diet. ?Continue present medications. ?Await pathology results. ? ?YOU HAD AN ENDOSCOPIC PROCEDURE TODAY AT [facility]:   Refer to the procedure report that was given to you for any specific questions about what was found during the examination.  If the procedure report does not answer your questions, please call your gastroenterologist to clarify.  If you requested that your care partner not be given the details of your procedure findings, then the procedure report has been included in a sealed envelope for you to review at your convenience later. ? ?YOU SHOULD EXPECT: Some feelings of bloating in the abdomen. Passage of more gas than usual.  Walking can help get rid of the air that was put into your GI tract during the procedure and reduce the bloating. If you had a lower endoscopy (such as a colonoscopy or flexible sigmoidoscopy) you may notice spotting of blood in your stool or on the toilet paper. If you underwent a bowel prep for your procedure, you may not have a normal bowel movement for a few days. ? ?Please Note:  You might notice some irritation and congestion in your nose or some drainage.  This is from the oxygen used during your procedure.  There is no need for concern and it should clear up in a day or so. ? ?SYMPTOMS TO REPORT IMMEDIATELY: ? ?Following lower endoscopy (colonoscopy or flexible sigmoidoscopy): ? Excessive amounts of blood in the stool ? Significant tenderness or worsening of abdominal pains ? Swelling of the abdomen that is new, acute ? Fever of 100?F or higher ? ?For urgent or emergent issues, a gastroenterologist can be reached at any hour by calling [phone]. ?Do not use MyChart messaging for urgent concerns.  ? ? ?DIET:  We do recommend a small meal at first, but then you may proceed to your regular diet.  Drink plenty of fluids but you should avoid alcoholic  beverages for 24 hours. ? ?ACTIVITY:  You should plan to take it easy for the rest of today and you should NOT DRIVE or use heavy machinery until tomorrow (because of the sedation medicines used during the test).   ? ?FOLLOW UP: ?Our staff will call the number listed on your records 48-72 hours following your procedure to check on you and address any questions or concerns that you may have regarding the information given to you following your procedure. If we do not reach you, we will leave a message.  We will attempt to reach you two times.  During this call, we will ask if you have developed any symptoms of COVID 19. If you develop any symptoms (ie: fever, flu-like symptoms, shortness of breath, cough etc.) before then, please call [phone].  If you test positive for Covid 19 in the 2 weeks post procedure, please call and report this information to [place].   ? ?If any biopsies were taken you will be contacted by phone or by letter within the next 1-3 weeks.  Please call us at [phone] if you have not heard about the biopsies in 3 weeks.  ? ? ?SIGNATURES/CONFIDENTIALITY: ?You and/or your care partner have signed paperwork which will be entered into your electronic medical record.  These signatures attest to the fact that that the information above on your After Visit Summary has been reviewed and is understood.  Full responsibility of the confidentiality of this discharge information lies with you and/or your care-partner.  ?

## 2021-10-28 NOTE — Progress Notes (Signed)
Called to room to assist during endoscopic procedure.  Patient ID and intended procedure confirmed with present staff. Received instructions for my participation in the procedure from the performing physician.  

## 2021-10-28 NOTE — Progress Notes (Signed)
VS-CW  Pt's states no medical or surgical changes since previsit or office visit.  

## 2021-10-28 NOTE — Progress Notes (Signed)
[facility] History and Physical ? ? ?Primary Care Physician:  Gerrit Heck, MD ? ? ?Reason for Procedure:   History of colon polyps, rectal bleeding, history of AIN s/p surgical removal ? ?Plan:    colonoscopy ? ? ? ? ?HPI: Jean Williams is a 61 y.o. female  here for colonoscopy to evaluate recent rectal bleeding, surveillance for history of colon polyps and AIN. Patient denies any bowel symptoms at this time. No family history of colon cancer known. Otherwise feels well without any cardiopulmonary symptoms.  ? ? ?Past Medical History:  ?Diagnosis Date  ? Chronic constipation   ? Eczema   ? Emphysema of lung ([place])   ? in epic CT angio Chest-- 01-24-2018  ? GAD (generalized anxiety disorder)   ? GERD (gastroesophageal reflux disease)   ? History of adenomatous polyp of colon   ? History of basal cell carcinoma (BCC) excision   ? 05-13-2011   s/p moh's left upper lip  ? History of panic attacks   ? Hyperlipidemia   ? Psoriasis   ? Rectal polyp   ? Schizophrenia ([place])   ? followed by [facility] , dr Andree Elk  ? Seasonal allergic rhinitis   ? Tachycardia   ? followed by pcp, first dx 2015, no meds for this,  last EKG in epic dated 02-09-2018  ? ? ?Past Surgical History:  ?Procedure Laterality Date  ? COLONOSCOPY    ? TRANSANAL EXCISION OF RECTAL MASS N/A 03/17/2019  ? Procedure: TRANSANAL EXCISION OF RECTAL POLYP;  Surgeon: Leighton Ruff, MD;  Location: [facility];  Service: General;  Laterality: N/A;  ? ? ?Prior to Admission medications   ?Medication Sig Start Date End Date Taking? Authorizing Provider  ?atorvastatin (LIPITOR) 40 MG tablet Take 1 tablet (40 mg total) by mouth at bedtime. 05/30/21  Yes Gerrit Heck, MD  ?carboxymethylcellul-glycerin (OPTIVE) 0.5-0.9 % ophthalmic solution Place 1 drop into both eyes 4 (four) times daily. Cannot self-administer ?Patient taking differently: Place 1 drop into both eyes 4 (four) times daily. Cannot self-administer 11/06/17  Yes Mayo,  Pete Pelt, MD  ?cloZAPine (CLOZARIL) 100 MG tablet Take 1 tablet (100 mg total) by mouth 2 (two) times daily. Take 1/2 tab every am & 3 tabs every evening ?Patient taking differently: Take 300 mg by mouth at bedtime. 01/10/11  Yes Funches, Josalyn, MD  ?docusate sodium (COLACE) 100 MG capsule Take 100 mg by mouth 2 (two) times daily.   Yes [provider]  ?famotidine (PEPCID) 20 MG tablet Take 1 tablet (20 mg total) by mouth 2 (two) times daily. 05/30/21  Yes Gerrit Heck, MD  ?loratadine (CLARITIN) 10 MG tablet Take 1 tablet (10 mg total) by mouth daily. 05/30/21  Yes Gerrit Heck, MD  ?Multiple Vitamins-Minerals (CERTAVITE/ANTIOXIDANTS) TABS TAKE 1 TABLET BY MOUTH ONCE DAILY. REPLACES [place] ?Patient taking differently: Take 1 tablet by mouth daily. 10/05/17  Yes Mayo, Pete Pelt, MD  ?[facility] 500 MG TABS Take 1 tablet (500 mg total) by mouth 2 (two) times daily. 05/30/21 10/28/21 Yes Gerrit Heck, MD  ?PARoxetine (PAXIL) 30 MG tablet TAKE 1 TABLET BY MOUTH IN THE MORNING. ?Patient taking differently: Take 30 mg by mouth daily. 08/07/17  Yes Mayo, Pete Pelt, MD  ?polyethylene glycol powder (GLYCOLAX/MIRALAX) 17 GM/SCOOP powder Take 17 g by mouth 2 (two) times daily as needed. 08/28/21  Yes Gerrit Heck, MD  ?triamcinolone ointment (KENALOG) 0.1 % Apply 1 application topically 2 (two) times daily. 03/06/21  Yes Gerrit Heck,  MD  ?acetaminophen (TYLENOL) 325 MG tablet Take 2 tablets (650 mg total) by mouth every 6 (six) hours as needed. 11/10/17   Hedges, Dellis Filbert, PA-C  ?albuterol (PROVENTIL HFA;VENTOLIN HFA) 108 (90 Base) MCG/ACT inhaler Inhale 1-2 puffs into the lungs every 6 (six) hours as needed for wheezing or shortness of breath. 10/01/15   Vivi Barrack, MD  ?betamethasone valerate ointment (VALISONE) 0.1 % Apply 1 application topically 2 (two) times daily. 03/04/21   Gerrit Heck, MD  ?fluticasone (FLONASE) 50 MCG/ACT nasal spray INAHALE 2 SPRAYS INTO EACH NOSTRIL ONCE DAILY.  05/30/21   Gerrit Heck, MD  ?fluticasone (FLOVENT HFA) 110 MCG/ACT inhaler INHALE 1 PUFF BY MOUTH TWICE DAILY. RINSE MOUTH AFTER USE. 05/30/21   Gerrit Heck, MD  ?hydrocortisone (ANUSOL-HC) 2.5 % rectal cream Place 1 application rectally 2 (two) times daily as needed for hemorrhoids. ?Patient not taking: Reported on 10/28/2021 09/06/21   Vladimir Crofts, PA-C  ? ? ?Current Outpatient Medications  ?Medication Sig Dispense Refill  ? atorvastatin (LIPITOR) 40 MG tablet Take 1 tablet (40 mg total) by mouth at bedtime. 90 tablet 3  ? carboxymethylcellul-glycerin (OPTIVE) 0.5-0.9 % ophthalmic solution Place 1 drop into both eyes 4 (four) times daily. Cannot self-administer (Patient taking differently: Place 1 drop into both eyes 4 (four) times daily. Cannot self-administer) 15 mL 0  ? cloZAPine (CLOZARIL) 100 MG tablet Take 1 tablet (100 mg total) by mouth 2 (two) times daily. Take 1/2 tab every am & 3 tabs every evening (Patient taking differently: Take 300 mg by mouth at bedtime.) 105 tablet 3  ? docusate sodium (COLACE) 100 MG capsule Take 100 mg by mouth 2 (two) times daily.    ? famotidine (PEPCID) 20 MG tablet Take 1 tablet (20 mg total) by mouth 2 (two) times daily. 60 tablet 0  ? loratadine (CLARITIN) 10 MG tablet Take 1 tablet (10 mg total) by mouth daily. 30 tablet 2  ? Multiple Vitamins-Minerals (CERTAVITE/ANTIOXIDANTS) TABS TAKE 1 TABLET BY MOUTH ONCE DAILY. REPLACES [place] (Patient taking differently: Take 1 tablet by mouth daily.) 30 tablet 0  ? [place] 500 MG TABS Take 1 tablet (500 mg total) by mouth 2 (two) times daily. 60 tablet 0  ? PARoxetine (PAXIL) 30 MG tablet TAKE 1 TABLET BY MOUTH IN THE MORNING. (Patient taking differently: Take 30 mg by mouth daily.) 30 tablet 0  ? polyethylene glycol powder (GLYCOLAX/MIRALAX) 17 GM/SCOOP powder Take 17 g by mouth 2 (two) times daily as needed. 3350 g 1  ? triamcinolone ointment (KENALOG) 0.1 % Apply 1 application topically 2 (two) times daily.  30 g 1  ? acetaminophen (TYLENOL) 325 MG tablet Take 2 tablets (650 mg total) by mouth every 6 (six) hours as needed. 30 tablet 0  ? albuterol (PROVENTIL HFA;VENTOLIN HFA) 108 (90 Base) MCG/ACT inhaler Inhale 1-2 puffs into the lungs every 6 (six) hours as needed for wheezing or shortness of breath. 2 Inhaler 2  ? betamethasone valerate ointment (VALISONE) 0.1 % Apply 1 application topically 2 (two) times daily. 30 g 0  ? fluticasone (FLONASE) 50 MCG/ACT nasal spray INAHALE 2 SPRAYS INTO EACH NOSTRIL ONCE DAILY. 16 g 0  ? fluticasone (FLOVENT HFA) 110 MCG/ACT inhaler INHALE 1 PUFF BY MOUTH TWICE DAILY. RINSE MOUTH AFTER USE. 12 g 0  ? hydrocortisone (ANUSOL-HC) 2.5 % rectal cream Place 1 application rectally 2 (two) times daily as needed for hemorrhoids. (Patient not taking: Reported on 10/28/2021) 30 g 0  ? ?Current Facility-Administered Medications  ?  Medication Dose Route Frequency Provider Last Rate Last Admin  ? 0.9 %  sodium chloride infusion  500 mL Intravenous Once [place] Davee, Carlota Raspberry, MD      ? ? ?Allergies as of 10/28/2021 - Review Complete 10/28/2021  ?Allergen Reaction Noted  ? Penicillins Hives 01/19/2006  ? ? ?Family History  ?Problem Relation Age of Onset  ? Cancer Sister   ? Breast cancer Sister   ? Cancer Maternal Grandmother   ? Breast cancer Maternal Grandmother   ? Stroke Other   ? Heart failure Other   ? Colon cancer Neg Hx   ? Colon polyps Neg Hx   ? Rectal cancer Neg Hx   ? Stomach cancer Neg Hx   ? Esophageal cancer Neg Hx   ? ? ?Social History  ? ?Socioeconomic History  ? Marital status: Single  ?  Spouse name: Not on file  ? Number of children: 1  ? Years of education: GED  ? Highest education level: GED or equivalent  ?Occupational History  ? Occupation: Estate manager/land agent  ?  Employer: [employer]  ?  Comment: Sundays-Tuesdays-Thursdays for 4 hours  ?Tobacco Use  ? Smoking status: Every Day  ?  Packs/day: 1.00  ?  Years: 40.00  ?  Pack years: 40.00  ?  Types: Cigarettes  ?  Passive exposure:  Current  ? Smokeless tobacco: Never  ? Tobacco comments:  ?  1 PPD  ?Vaping Use  ? Vaping Use: Never used  ?Substance and Sexual Activity  ? Alcohol use: No  ?  Alcohol/week: 0.0 standard drinks  ? Drug use:

## 2021-10-28 NOTE — Progress Notes (Deleted)
Called to room to assist during endoscopic procedure.  Patient ID and intended procedure confirmed with present staff. Received instructions for my participation in the procedure from the performing physician.  

## 2021-10-28 NOTE — Progress Notes (Signed)
Report given to PACU, vss 

## 2021-10-30 ENCOUNTER — Telehealth: Payer: Self-pay | Admitting: *Deleted

## 2021-10-30 ENCOUNTER — Telehealth: Payer: Self-pay

## 2021-10-30 NOTE — Telephone Encounter (Signed)
Follow up call placed, no answer, VM box not accepting messages. ?SChaplin, RN,BSN ? ?

## 2021-10-30 NOTE — Telephone Encounter (Signed)
No answer on second attempt follow up call.  ? ?

## 2021-12-05 ENCOUNTER — Ambulatory Visit (INDEPENDENT_AMBULATORY_CARE_PROVIDER_SITE_OTHER): Payer: Medicare Other | Admitting: Student

## 2021-12-05 ENCOUNTER — Encounter: Payer: Self-pay | Admitting: Student

## 2021-12-05 VITALS — BP 114/76 | HR 115 | Ht 67.0 in | Wt 163.8 lb

## 2021-12-05 DIAGNOSIS — Z87898 Personal history of other specified conditions: Secondary | ICD-10-CM

## 2021-12-05 DIAGNOSIS — F17200 Nicotine dependence, unspecified, uncomplicated: Secondary | ICD-10-CM | POA: Diagnosis not present

## 2021-12-05 NOTE — Patient Instructions (Signed)
It was great to see you! Thank you for allowing me to participate in your care!   I recommend that you always bring your medications to each appointment as this makes it easy to ensure we are on the correct medications and helps [place] not miss when refills are needed.  Our plans for today:  - We cancelled your UA since your symptoms are resolved - Please drink plenty of water/fluids -Let us know if symptoms recur  Take care and seek immediate care sooner if you develop any concerns. Please remember to show up 15 minutes before your scheduled appointment time!  Gerrit Heck, MD [place]

## 2021-12-05 NOTE — Assessment & Plan Note (Signed)
Continues to smoke 1 ppd, counseled today. Not interested in cutting back or stopping. Encouraged patient to let me know if wanting help with this

## 2021-12-05 NOTE — Progress Notes (Signed)
    SUBJECTIVE:   CHIEF COMPLAINT / HPI:   History of incontinence Saturday morning woke up lethargic and was sleeping more than normal and didn't eat or get up until later in the day. She had bad diarrhea on Saturday but currently does not have any. Incontinent of urine that day, however today she is feeling much better. Denies any pain when urinating.  She is able to hold her urine and currently and is not incontinent but wears depends underwear just in case there is any leaking. Denies any abdominal pain or symptoms currently. Lives in a supervised facility and they had wanted her to get checked out but overall she feels well today.  Smoking  1 ppd not intersted in cutting down, discussed with her that this is not something I would recommend continuing.  Patient is not currently interested in decreasing or stopping.  PERTINENT  PMH / PSH: Schizophrenia, rosacea, Patient follows with psych and counseling for clozaril monitoring which she says she has been on it for 40 years  OBJECTIVE:   BP 114/76   Pulse (!) 115   Ht '5\' 7"'$  (1.702 m)   Wt 163 lb 12.8 oz (74.3 kg)   LMP 06/10/2011   SpO2 99%   BMI 25.65 kg/m   General: Well appearing, NAD, awake, alert, responsive to questions Head: Normocephalic atraumatic CV: Regular rate and rhythm no murmurs rubs or gallops Respiratory: Clear to ausculation bilaterally, no wheezes rales or crackles Abdomen: Soft, non-tender, non-distended, normoactive bowel sounds  Extremities: Moves upper and lower extremities freely, no edema in LE Neuro: No focal deficits Skin: Rosacea present face  ASSESSMENT/PLAN:   1. History of urinary incontinence Deferred UA/culture given patient does not have any symptoms and does not have. Encouraged plenty of water/fluids. Denies any urinary incontinence today. No issues with perineal sensation per patient. -Monitor    TOBACCO DEPENDENCE Continues to smoke 1 ppd, counseled today. Not interested in cutting back  or stopping. Encouraged patient to let me know if wanting help with this    Gerrit Heck, MD [place]

## 2021-12-10 ENCOUNTER — Encounter: Payer: Self-pay | Admitting: *Deleted

## 2022-01-17 ENCOUNTER — Ambulatory Visit (INDEPENDENT_AMBULATORY_CARE_PROVIDER_SITE_OTHER): Payer: Medicare Other | Admitting: Family Medicine

## 2022-01-17 VITALS — BP 132/64 | HR 99 | Temp 98.5°F | Ht 67.0 in | Wt 158.0 lb

## 2022-01-17 DIAGNOSIS — N390 Urinary tract infection, site not specified: Secondary | ICD-10-CM | POA: Diagnosis not present

## 2022-01-17 DIAGNOSIS — N3000 Acute cystitis without hematuria: Secondary | ICD-10-CM | POA: Diagnosis not present

## 2022-01-17 DIAGNOSIS — N39498 Other specified urinary incontinence: Secondary | ICD-10-CM

## 2022-01-17 HISTORY — DX: Urinary tract infection, site not specified: N39.0

## 2022-01-17 LAB — POCT URINALYSIS DIP (MANUAL ENTRY)
Bilirubin, UA: NEGATIVE
Glucose, UA: NEGATIVE mg/dL
Ketones, POC UA: NEGATIVE mg/dL
Nitrite, UA: NEGATIVE
Spec Grav, UA: 1.01 (ref 1.010–1.025)
Urobilinogen, UA: 0.2 E.U./dL
pH, UA: 7 (ref 5.0–8.0)

## 2022-01-17 LAB — POCT UA - MICROSCOPIC ONLY
Epithelial cells, urine per micros: >20
WBC, Ur, HPF, POC: >20 (ref 0–5)

## 2022-01-17 MED ORDER — NITROFURANTOIN MONOHYD MACRO 100 MG PO CAPS: 100.0000 mg | ORAL_CAPSULE | Freq: Two times a day (BID) | ORAL | 0 refills | Status: DC

## 2022-01-17 NOTE — Patient Instructions (Addendum)
It was great seeing you today!  Today we discussed your urinary symptoms. It seems that you may have a urinary tract infection. I have prescribed macrobid 100 mg, please take this twice daily for 7 days. Make sure to complete treatment even if you happen to feel better before 7 days so that we completely clear the infection.   Please follow up at your next scheduled appointment, if anything arises between now and then, please don't hesitate to contact our office.   Thank you for allowing [place] to be a part of your medical care!  Thank you, Dr. Larae Grooms

## 2022-01-17 NOTE — Progress Notes (Signed)
    SUBJECTIVE:   CHIEF COMPLAINT / HPI:   Patient presents from facility due to  concern of not feeling her best and wanted to make sure she was doing well before returning back. Her facility recommended that she come see the doctor.  She has a history of urinary incontinence but today denies being incontinent. But she still wears Depends to be safe. Denies fever, chills, dysuria, discomfort, irritation, itching and vomiting. She is not sexually active. Cough and congestion that has almost resolved but started 2-3 days ago. BM are soft and formed, she has a BM daily. Denies hematochezia or hematuria.   OBJECTIVE:   BP 132/64   Pulse 99   Temp 98.5 F (36.9 C) (Oral)   Ht '5\' 7"'$  (1.702 m)   Wt 158 lb (71.7 kg)   LMP 06/10/2011   SpO2 98%   BMI 24.75 kg/m   General: Patient well-appearing, in no acute distress. HEENT: no evidence of cervical LAD, normal buccal mucosa  CV: RRR, no murmurs or gallops auscultated Resp: CTAB, no wheezing, rales or rhonchi noted Ext: no edema or cyanosis, radial pulses strong and equal bilaterally Neuro: ambulates with assistance of cane for long distances  ASSESSMENT/PLAN:   UTI (urinary tract infection) -UA significant for leukocytosis without WBCs, given symptoms will treat with macrobid 100 mg bid for 7 days -awaiting urine culture, explained to patient that we can switch to another antibiotic if indicated based on culture result -facility paperwork completed -likely symptoms secondary to UTI, reassurance provided to patient  -follow up with PCP as appropriate      Donney Dice, [place]

## 2022-01-17 NOTE — Assessment & Plan Note (Signed)
-  UA significant for leukocytosis without WBCs, given symptoms will treat with macrobid 100 mg bid for 7 days -awaiting urine culture, explained to patient that we can switch to another antibiotic if indicated based on culture result -facility paperwork completed -likely symptoms secondary to UTI, reassurance provided to patient  -follow up with PCP as appropriate

## 2022-01-19 LAB — URINE CULTURE

## 2022-01-21 ENCOUNTER — Telehealth: Payer: Self-pay

## 2022-01-21 DIAGNOSIS — N390 Urinary tract infection, site not specified: Secondary | ICD-10-CM

## 2022-01-21 MED ORDER — NITROFURANTOIN MONOHYD MACRO 100 MG PO CAPS: 100.0000 mg | ORAL_CAPSULE | Freq: Two times a day (BID) | ORAL | 0 refills | Status: AC

## 2022-01-21 NOTE — Telephone Encounter (Signed)
Patients group home calls nurse line in regards to [place].   She reports they do not use [facility] and requests prescription be sent to [place].   I have cancelled prescription at [facility] and resent to [facility].

## 2022-04-23 ENCOUNTER — Ambulatory Visit (INDEPENDENT_AMBULATORY_CARE_PROVIDER_SITE_OTHER): Payer: Medicare Other

## 2022-04-23 DIAGNOSIS — Z23 Encounter for immunization: Secondary | ICD-10-CM

## 2022-05-19 ENCOUNTER — Other Ambulatory Visit: Payer: Self-pay

## 2022-05-19 DIAGNOSIS — K921 Melena: Secondary | ICD-10-CM

## 2022-05-19 DIAGNOSIS — L989 Disorder of the skin and subcutaneous tissue, unspecified: Secondary | ICD-10-CM

## 2022-05-20 MED ORDER — BETAMETHASONE VALERATE 0.1 % EX OINT: 1.0000 | TOPICAL_OINTMENT | Freq: Two times a day (BID) | CUTANEOUS | 0 refills | Status: DC

## 2022-05-20 MED ORDER — ATORVASTATIN CALCIUM 40 MG PO TABS: 40.0000 mg | ORAL_TABLET | Freq: Every day | ORAL | 3 refills | Status: DC

## 2022-05-20 MED ORDER — TRIAMCINOLONE ACETONIDE 0.1 % EX OINT: 1.0000 | TOPICAL_OINTMENT | Freq: Two times a day (BID) | CUTANEOUS | 1 refills | Status: DC

## 2022-05-20 MED ORDER — POLYETHYLENE GLYCOL 3350 17 GM/SCOOP PO POWD: 17.0000 g | Freq: Two times a day (BID) | ORAL | 1 refills | Status: DC | PRN

## 2022-05-20 MED ORDER — OYSTER SHELL 500 MG PO TABS: 1.0000 | ORAL_TABLET | Freq: Two times a day (BID) | ORAL | 0 refills | Status: DC

## 2022-05-20 MED ORDER — ALBUTEROL SULFATE HFA 108 (90 BASE) MCG/ACT IN AERS: 1.0000 | INHALATION_SPRAY | Freq: Four times a day (QID) | RESPIRATORY_TRACT | 2 refills | Status: DC | PRN

## 2022-05-20 MED ORDER — ACETAMINOPHEN 325 MG PO TABS: 650.0000 mg | ORAL_TABLET | Freq: Four times a day (QID) | ORAL | 0 refills | Status: DC | PRN

## 2022-05-20 MED ORDER — FAMOTIDINE 20 MG PO TABS: 20.0000 mg | ORAL_TABLET | Freq: Two times a day (BID) | ORAL | 0 refills | Status: DC

## 2022-06-20 ENCOUNTER — Other Ambulatory Visit: Payer: Self-pay | Admitting: Psychiatry

## 2022-06-20 DIAGNOSIS — Z1231 Encounter for screening mammogram for malignant neoplasm of breast: Secondary | ICD-10-CM

## 2022-08-20 ENCOUNTER — Ambulatory Visit
Admission: RE | Admit: 2022-08-20 | Discharge: 2022-08-20 | Disposition: A | Discharge status: Home / Self Care | Payer: Medicare Other | Source: Ambulatory Visit | Attending: Psychiatry | Admitting: Psychiatry

## 2022-08-20 DIAGNOSIS — Z1231 Encounter for screening mammogram for malignant neoplasm of breast: Secondary | ICD-10-CM

## 2022-08-22 ENCOUNTER — Other Ambulatory Visit: Payer: Self-pay | Admitting: Psychiatry

## 2022-08-22 DIAGNOSIS — R928 Other abnormal and inconclusive findings on diagnostic imaging of breast: Secondary | ICD-10-CM

## 2022-08-28 ENCOUNTER — Other Ambulatory Visit: Payer: Self-pay | Admitting: Student

## 2022-08-28 DIAGNOSIS — R928 Other abnormal and inconclusive findings on diagnostic imaging of breast: Secondary | ICD-10-CM

## 2022-09-03 ENCOUNTER — Ambulatory Visit
Admission: RE | Admit: 2022-09-03 | Discharge: 2022-09-03 | Disposition: A | Discharge status: Home / Self Care | Payer: Medicare Other | Source: Ambulatory Visit | Attending: Psychiatry | Admitting: Psychiatry

## 2022-09-03 ENCOUNTER — Other Ambulatory Visit: Payer: Self-pay | Admitting: Student

## 2022-09-03 DIAGNOSIS — N6310 Unspecified lump in the right breast, unspecified quadrant: Secondary | ICD-10-CM

## 2022-09-03 DIAGNOSIS — R928 Other abnormal and inconclusive findings on diagnostic imaging of breast: Secondary | ICD-10-CM

## 2022-09-09 ENCOUNTER — Other Ambulatory Visit: Payer: Medicare Other

## 2022-09-09 ENCOUNTER — Ambulatory Visit
Admission: RE | Admit: 2022-09-09 | Discharge: 2022-09-09 | Disposition: A | Discharge status: Home / Self Care | Payer: Medicare Other | Source: Ambulatory Visit | Attending: Family Medicine | Admitting: Family Medicine

## 2022-09-09 DIAGNOSIS — N6310 Unspecified lump in the right breast, unspecified quadrant: Secondary | ICD-10-CM

## 2022-09-09 DIAGNOSIS — R928 Other abnormal and inconclusive findings on diagnostic imaging of breast: Secondary | ICD-10-CM

## 2022-09-09 HISTORY — PX: BREAST BIOPSY: SHX20

## 2022-09-10 ENCOUNTER — Telehealth: Payer: Self-pay | Admitting: Hematology and Oncology

## 2022-09-10 ENCOUNTER — Other Ambulatory Visit: Payer: Medicare Other

## 2022-09-10 NOTE — Telephone Encounter (Signed)
Spoke to Amma at the patients [place]  to confirm upcoming afternoon [facility] clinic appointment on 3/13, paperwork will be sent via e-mail.   Gave location and time, also informed patient that the surgeon's office would be calling as well to get information from them similar to the packet that they will be receiving so make sure to do both.  Reminded patient that all providers will be coming to the clinic to see them HERE and if they had any questions to not hesitate to reach back out to myself or their navigators.

## 2022-09-16 ENCOUNTER — Encounter: Payer: Self-pay | Admitting: *Deleted

## 2022-09-16 ENCOUNTER — Other Ambulatory Visit: Payer: Self-pay

## 2022-09-16 DIAGNOSIS — C50211 Malignant neoplasm of upper-inner quadrant of right female breast: Secondary | ICD-10-CM

## 2022-09-16 NOTE — Progress Notes (Signed)
Radiation Oncology         [phone] ________________________________  Multidisciplinary Breast Oncology Clinic [facility]) Initial Outpatient Consultation  Name: Jean Williams MRN: [id number]  Date: 09/17/2022  DOB: [birth year]  [id number], Dwain Sarna, MD  Jovita Kussmaul, MD   REFERRING PHYSICIAN: Autumn Messing III, MD  DIAGNOSIS: There were no encounter diagnoses.  Stage *** Right Breast UIQ, Invasive Ductal Carcinoma, ER+ / PR+ / Her2-, Grade 1   No diagnosis found.  HISTORY OF PRESENT ILLNESS::Jean Williams is a 62 y.o. female who is presenting to the office today for evaluation of her newly diagnosed breast cancer. She is accompanied by ***. She is doing well overall.   She had routine screening mammography on 08/20/22 showing possible clustered masses in the right breast. She underwent a right breast diagnostic mammography with tomography and right breast ultrasonography at [facility] on 09/03/22 showing: a 5 x 5 x 3 mm irregular, heterogeneous mass in the 3 o'clock position of the right breast, 3 cmfn sonographically. This mass corresponds to a small spiculated mass seen on the mammogram portion of this exam. [place] otherwise showed no right axillary lymphadenopathy, and benign right breast duct ectasia.    Biopsy of the 3 o'clock right breast on 09/09/22 showed: grade 2 invasive ductal carcinoma measuring 0.5 cm in the greatest linear extent of the sample. Prognostic indicators significant for: estrogen receptor, 100% positive and progesterone receptor, 20% positive, both with strong staining intensity. Proliferation marker Ki67 at 5%. HER2 negative.  Menarche: *** years old Age at first live birth: *** years old GP: *** LMP: *** Contraceptive: *** HRT: ***   The patient was referred today for presentation in the multidisciplinary conference.  Radiology studies and pathology slides were presented there for review and discussion of treatment options.  A consensus was  discussed regarding potential next steps.  PREVIOUS RADIATION THERAPY: {EXAM; YES/NO:19492::"No"}  PAST MEDICAL HISTORY:  Past Medical History:  Diagnosis Date   Chronic constipation    Eczema    Emphysema of lung ([place])    in epic CT angio Chest-- 01-24-2018   GAD (generalized anxiety disorder)    GERD (gastroesophageal reflux disease)    History of adenomatous polyp of colon    History of basal cell carcinoma (BCC) excision    05-13-2011   s/p moh's left upper lip   History of panic attacks    Hyperlipidemia    Psoriasis    Rectal polyp    Schizophrenia ([place])    followed by [facility] , dr Andree Elk   Seasonal allergic rhinitis    Tachycardia    followed by pcp, first dx 2015, no meds for this,  last EKG in epic dated 02-09-2018    PAST SURGICAL HISTORY: Past Surgical History:  Procedure Laterality Date   BREAST BIOPSY Right 09/09/2022   [place] RT BREAST BX W LOC DEV 1ST LESION IMG BX SPEC US GUIDE 09/09/2022 [facility] MAMMOGRAPHY   COLONOSCOPY     TRANSANAL EXCISION OF RECTAL MASS N/A 03/17/2019   Procedure: TRANSANAL EXCISION OF RECTAL POLYP;  Surgeon: Leighton Ruff, MD;  Location: [place];  Service: General;  Laterality: N/A;    FAMILY HISTORY:  Family History  Problem Relation Age of Onset   Cancer Sister    Breast cancer Sister    Cancer Maternal Grandmother    Breast cancer Maternal Grandmother    Stroke Other    Heart failure Other    Colon cancer Neg Hx  Colon polyps Neg Hx    Rectal cancer Neg Hx    Stomach cancer Neg Hx    Esophageal cancer Neg Hx     SOCIAL HISTORY:  Social History   Socioeconomic History   Marital status: Single    Spouse name: Not on file   Number of children: 1   Years of education: GED   Highest education level: GED or equivalent  Occupational History   Occupation: Conservation officer, historic buildings: [place]    Comment: Sundays-Tuesdays-Thursdays for 4 hours  Tobacco Use   Smoking status: Every Day    Packs/day:  1.00    Years: 40.00    Total pack years: 40.00    Types: Cigarettes    Passive exposure: Current   Smokeless tobacco: Never   Tobacco comments:    1 PPD  Vaping Use   Vaping Use: Never used  Substance and Sexual Activity   Alcohol use: No    Alcohol/week: 0.0 standard drinks of alcohol   Drug use: Not Currently    Types: Marijuana    Comment: yrs ago   Sexual activity: Not Currently    Birth control/protection: Post-menopausal  Other Topics Concern   Not on file  Social History Narrative   Patients lives in an assisted living in [place].   Patient has legal guardians/financial- sister Karena Addison and brother Elta [place].    Patient uses [employer] for transportation.    Patient has one daughter in [place]. Patient reports good contact, however "sporadic."    Patient works 3 days per week at [employer] and really enjoys it.    Patient is active at [facility] [employer] and has "lots" of friends.    Social Determinants of Health   Financial Resource Strain: Low Risk  (08/21/2021)   Overall Financial Resource Strain (CARDIA)    Difficulty of Paying Living Expenses: Not very hard  Food Insecurity: No Food Insecurity (08/21/2021)   Hunger Vital Sign    Worried About Running Out of Food in the Last Year: Never true    Ran Out of Food in the Last Year: Never true  Transportation Needs: No Transportation Needs (08/21/2021)   PRAPARE - Hydrologist (Medical): No    Lack of Transportation (Non-Medical): No  Physical Activity: Inactive (08/21/2021)   Exercise Vital Sign    Days of Exercise per Week: 0 days    Minutes of Exercise per Session: 0 min  Stress: No Stress Concern Present (08/21/2021)   [place]    Feeling of Stress : Not at all  Social Connections: Moderately Integrated (08/21/2021)   Social Connection and Isolation Panel [NHANES]    Frequency of Communication with Friends and Family:  More than three times a week    Frequency of Social Gatherings with Friends and Family: More than three times a week    Attends Religious Services: 1 to 4 times per year    Active Member of [employer] or Organizations: Yes    Attends Music therapist: More than 4 times per year    Marital Status: Never married    ALLERGIES:  Allergies  Allergen Reactions   Penicillins Hives    REACTION: unspecified/ whelts  Has patient had a PCN reaction causing immediate rash, facial/tongue/throat swelling, SOB or lightheadedness with hypotension: Yes Has patient had a PCN reaction causing severe rash involving mucus membranes or skin necrosis: No Has patient had a PCN reaction  that required hospitalization: No Has patient had a PCN reaction occurring within the last 10 years: no If all of the above answers are "NO", then may proceed with Cephalosporin use.      MEDICATIONS:  Current Outpatient Medications  Medication Sig Dispense Refill   acetaminophen (TYLENOL) 325 MG tablet Take 2 tablets (650 mg total) by mouth every 6 (six) hours as needed. 30 tablet 0   albuterol (VENTOLIN HFA) 108 (90 Base) MCG/ACT inhaler Inhale 1-2 puffs into the lungs every 6 (six) hours as needed for wheezing or shortness of breath. 2 each 2   atorvastatin (LIPITOR) 40 MG tablet Take 1 tablet (40 mg total) by mouth at bedtime. 90 tablet 3   betamethasone valerate ointment (VALISONE) 0.1 % Apply 1 Application topically 2 (two) times daily. 30 g 0   carboxymethylcellul-glycerin (OPTIVE) 0.5-0.9 % ophthalmic solution Place 1 drop into both eyes 4 (four) times daily. Cannot self-administer (Patient taking differently: Place 1 drop into both eyes 4 (four) times daily. Cannot self-administer) 15 mL 0   cloZAPine (CLOZARIL) 100 MG tablet Take 1 tablet (100 mg total) by mouth 2 (two) times daily. Take 1/2 tab every am & 3 tabs every evening (Patient taking differently: Take 300 mg by mouth at bedtime.) 105 tablet 3    docusate sodium (COLACE) 100 MG capsule Take 100 mg by mouth 2 (two) times daily.     famotidine (PEPCID) 20 MG tablet Take 1 tablet (20 mg total) by mouth 2 (two) times daily. 60 tablet 0   Multiple Vitamins-Minerals (CERTAVITE/ANTIOXIDANTS) TABS TAKE 1 TABLET BY MOUTH ONCE DAILY. REPLACES [place] (Patient taking differently: Take 1 tablet by mouth daily.) 30 tablet 0   Oyster Shell 500 MG TABS Take 1 tablet (500 mg total) by mouth 2 (two) times daily. 60 tablet 0   PARoxetine (PAXIL) 30 MG tablet TAKE 1 TABLET BY MOUTH IN THE MORNING. (Patient taking differently: Take 30 mg by mouth daily.) 30 tablet 0   polyethylene glycol powder (GLYCOLAX/MIRALAX) 17 GM/SCOOP powder Take 17 g by mouth 2 (two) times daily as needed. 3350 g 1   triamcinolone ointment (KENALOG) 0.1 % Apply 1 Application topically 2 (two) times daily. 30 g 1   No current facility-administered medications for this encounter.    REVIEW OF SYSTEMS: A 10+ POINT REVIEW OF SYSTEMS WAS OBTAINED including neurology, dermatology, psychiatry, cardiac, respiratory, lymph, extremities, GI, GU, musculoskeletal, constitutional, reproductive, HEENT. On the provided form, she reports ***. She denies *** and any other symptoms.    PHYSICAL EXAM:  vitals were not taken for this visit.  {may need to copy over vitals} Lungs are clear to auscultation bilaterally. Heart has regular rate and rhythm. No palpable cervical, supraclavicular, or axillary adenopathy. Abdomen soft, non-tender, normal bowel sounds. Breast: *** breast with no palpable mass, nipple discharge, or bleeding. *** breast with ***.   KPS = ***  100 - Normal; no complaints; no evidence of disease. 90   - Able to carry on normal activity; minor signs or symptoms of disease. 80   - Normal activity with effort; some signs or symptoms of disease. 67   - Cares for self; unable to carry on normal activity or to do active work. 60   - Requires occasional assistance, but is able to care  for most of his personal needs. 50   - Requires considerable assistance and frequent medical care. 83   - Disabled; requires special care and assistance. 30   - Severely disabled; hospital  admission is indicated although death not imminent. 71   - Very sick; hospital admission necessary; active supportive treatment necessary. 10   - Moribund; fatal processes progressing rapidly. 0     - Dead  Karnofsky DA, Abelmann Wildrose, Craver LS and Burchenal [facility] [phone]) The use of the nitrogen mustards in the palliative treatment of carcinoma: with particular reference to bronchogenic carcinoma Cancer 1 [phone]  LABORATORY DATA:  Lab Results  Component Value Date   WBC 7.4 08/28/2021   HGB 14.4 08/28/2021   HCT 41.9 08/28/2021   MCV 91 08/28/2021   PLT 303 08/28/2021   Lab Results  Component Value Date   NA 141 11/01/2019   K 4.4 11/01/2019   CL 104 11/01/2019   CO2 24 11/01/2019   Lab Results  Component Value Date   ALT 24 11/01/2019   AST 18 11/01/2019   ALKPHOS 118 (H) 11/01/2019   BILITOT 0.5 11/01/2019    PULMONARY FUNCTION TEST:   Review Flowsheet        No data to display          RADIOGRAPHY: [place] RT BREAST BX W LOC DEV 1ST LESION IMG BX SPEC US GUIDE  Addendum Date: 09/11/2022   ADDENDUM REPORT: 09/11/2022 07:29 ADDENDUM: Pathology revealed 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 This was found to be concordant by Dr. Dorise Bullion. Pathology results were discussed with Alphonzo Dublin, Administrator at [facility] by telephone, per request. My direct phone number was provided. The patient was referred to [facility] on September 17, 2022. Pathology results reported by Terie Purser, RN on 09/10/2022. Electronically Signed   By: Dorise Bullion III M.D.   On: 09/11/2022 07:29   Result Date: 09/11/2022 CLINICAL DATA:  Biopsy of a 3 o'clock right breast mass  EXAM: ULTRASOUND GUIDED RIGHT BREAST CORE NEEDLE BIOPSY COMPARISON:  Previous exam(s). PROCEDURE: I met with the patient and we discussed the procedure of ultrasound-guided biopsy, including benefits and alternatives. We discussed the high likelihood of a successful procedure. We discussed the risks of the procedure, including infection, bleeding, tissue injury, clip migration, and inadequate sampling. Informed written consent was given. The usual time-out protocol was performed immediately prior to the procedure. Lesion quadrant: 3 o'clock right breast Using sterile technique and 1% Lidocaine as local anesthetic, under direct ultrasound visualization, a 12 gauge spring-loaded device was used to perform biopsy of a 3 o'clock right breast mass using a medial approach. At the conclusion of the procedure a coil shaped tissue marker clip was deployed into the biopsy cavity. Follow up 2 view mammogram was performed and dictated separately. IMPRESSION: Ultrasound guided biopsy of a right breast mass as above. No apparent complications. Electronically Signed: By: Dorise Bullion III M.D. On: 09/09/2022 10:20  MM CLIP PLACEMENT RIGHT  Result Date: 09/09/2022 CLINICAL DATA:  Evaluate biopsy marker EXAM: 3D DIAGNOSTIC RIGHT MAMMOGRAM POST ULTRASOUND BIOPSY COMPARISON:  Previous exam(s). FINDINGS: 3D Mammographic images were obtained following ultrasound guided biopsy of a small spiculated right breast mass. The biopsy marking clip is in expected position at the site of biopsy. IMPRESSION: Appropriate positioning of the coil shaped biopsy marking clip at the site of biopsy in the region of the biopsied right breast mass. Final Assessment: Post Procedure Mammograms for Marker Placement Electronically Signed   By: Dorise Bullion III M.D.   On: 09/09/2022 10:40  MM DIAG  BREAST TOMO UNI RIGHT  Result Date: 09/03/2022 CLINICAL DATA:  Possible clustered masses in the anteromedial right breast on a recent screening mammogram.  EXAM: DIGITAL DIAGNOSTIC UNILATERAL RIGHT MAMMOGRAM WITH TOMOSYNTHESIS; ULTRASOUND RIGHT BREAST LIMITED TECHNIQUE: Right digital diagnostic mammography and breast tomosynthesis was performed.; Targeted ultrasound examination of the right breast was performed COMPARISON:  Previous exam(s). ACR Breast Density Category b: There are scattered areas of fibroglandular density. FINDINGS: Chronically dilated medial retroareolar ducts on the right in the area of the recently suspected clustered masses. There is also a small spiculated mass more medially in the right breast. On physical exam, no mass is palpable in the medial right breast. Targeted ultrasound is performed, showing 5 x 5 x 3 mm irregular, heterogeneous mass with posterior acoustical shadowing in the 3 o'clock position of the right breast, 3 cm from the nipple. This corresponds to the small spiculated mass seen on the mammogram images. Also demonstrated are multiple dilated retroareolar ducts in the 2 o'clock position of the right breast with no intraductal masses seen. Ultrasound of the right axilla demonstrated normal appearing right axillary lymph nodes. IMPRESSION: 1. 5 mm mass in the 3 o'clock position of the right breast with imaging features suspicious for malignancy. 2. Benign right breast duct ectasia. RECOMMENDATION: Ultrasound-guided core needle biopsy of the 5 mm mass in the 3 o'clock position of the right breast. This has been discussed with the patient and scheduled at 9:30 a.m. on 09/09/2022. I have discussed the findings and recommendations with the patient. If applicable, a reminder letter will be sent to the patient regarding the next appointment. BI-RADS CATEGORY  4: Suspicious. Electronically Signed   By: Claudie Revering M.D.   On: 09/03/2022 09:57  US BREAST LTD UNI RIGHT INC AXILLA  Result Date: 09/03/2022 CLINICAL DATA:  Possible clustered masses in the anteromedial right breast on a recent screening mammogram. EXAM: DIGITAL DIAGNOSTIC  UNILATERAL RIGHT MAMMOGRAM WITH TOMOSYNTHESIS; ULTRASOUND RIGHT BREAST LIMITED TECHNIQUE: Right digital diagnostic mammography and breast tomosynthesis was performed.; Targeted ultrasound examination of the right breast was performed COMPARISON:  Previous exam(s). ACR Breast Density Category b: There are scattered areas of fibroglandular density. FINDINGS: Chronically dilated medial retroareolar ducts on the right in the area of the recently suspected clustered masses. There is also a small spiculated mass more medially in the right breast. On physical exam, no mass is palpable in the medial right breast. Targeted ultrasound is performed, showing 5 x 5 x 3 mm irregular, heterogeneous mass with posterior acoustical shadowing in the 3 o'clock position of the right breast, 3 cm from the nipple. This corresponds to the small spiculated mass seen on the mammogram images. Also demonstrated are multiple dilated retroareolar ducts in the 2 o'clock position of the right breast with no intraductal masses seen. Ultrasound of the right axilla demonstrated normal appearing right axillary lymph nodes. IMPRESSION: 1. 5 mm mass in the 3 o'clock position of the right breast with imaging features suspicious for malignancy. 2. Benign right breast duct ectasia. RECOMMENDATION: Ultrasound-guided core needle biopsy of the 5 mm mass in the 3 o'clock position of the right breast. This has been discussed with the patient and scheduled at 9:30 a.m. on 09/09/2022. I have discussed the findings and recommendations with the patient. If applicable, a reminder letter will be sent to the patient regarding the next appointment. BI-RADS CATEGORY  4: Suspicious. Electronically Signed   By: Claudie Revering M.D.   On: 09/03/2022 09:57  MM 3D SCREEN BREAST BILATERAL  Result Date: 08/21/2022 CLINICAL DATA:  Screening. EXAM: DIGITAL SCREENING BILATERAL MAMMOGRAM WITH TOMOSYNTHESIS AND CAD TECHNIQUE: Bilateral screening digital craniocaudal and  mediolateral oblique mammograms were obtained. Bilateral screening digital breast tomosynthesis was performed. The images were evaluated with computer-aided detection. COMPARISON:  Previous exam(s). ACR Breast Density Category b: There are scattered areas of fibroglandular density. FINDINGS: In the right breast, possible clustered masses warrant further evaluation. In the left breast, no findings suspicious for malignancy. IMPRESSION: Further evaluation is suggested for a possible clustered masses in the right breast. RECOMMENDATION: Diagnostic mammogram and possibly ultrasound of the right breast. (Code:[id number]-[id number]) The patient will be contacted regarding the findings, and additional imaging will be scheduled. BI-RADS CATEGORY  0: Incomplete: Need additional imaging evaluation. Electronically Signed   By: Ammie Ferrier M.D.   On: 08/21/2022 13:00      IMPRESSION: *** {DIAGNOSIS HERE}  Patient will be a good candidate for breast conservation with radiotherapy to the right breast. We discussed the general course of radiation, potential side effects, and toxicities with radiation and the patient is interested in this approach. ***   PLAN:  *** (copy notes from board at conference)   ------------------------------------------------  Blair Promise, PhD, MD  This document serves as a record of services personally performed by Gery Pray, MD. It was created on his behalf by Roney Mans, a trained medical scribe. The creation of this record is based on the scribe's personal observations and the provider's statements to them. This document has been checked and approved by the attending provider.

## 2022-09-17 ENCOUNTER — Inpatient Hospital Stay (HOSPITAL_BASED_OUTPATIENT_CLINIC_OR_DEPARTMENT_OTHER): Payer: Medicare Other | Admitting: Genetic Counselor

## 2022-09-17 ENCOUNTER — Other Ambulatory Visit: Payer: Self-pay

## 2022-09-17 ENCOUNTER — Ambulatory Visit
Admission: RE | Admit: 2022-09-17 | Discharge: 2022-09-17 | Disposition: A | Discharge status: Home / Self Care | Payer: Medicare Other | Source: Ambulatory Visit | Attending: Radiation Oncology | Admitting: Radiation Oncology

## 2022-09-17 ENCOUNTER — Encounter: Payer: Self-pay | Admitting: *Deleted

## 2022-09-17 ENCOUNTER — Inpatient Hospital Stay: Payer: Medicare Other | Attending: Hematology | Admitting: Hematology

## 2022-09-17 ENCOUNTER — Ambulatory Visit: Payer: Self-pay | Admitting: General Surgery

## 2022-09-17 ENCOUNTER — Inpatient Hospital Stay: Payer: Medicare Other

## 2022-09-17 ENCOUNTER — Encounter: Payer: Self-pay | Admitting: General Practice

## 2022-09-17 ENCOUNTER — Ambulatory Visit: Payer: Medicare Other | Admitting: Physical Therapy

## 2022-09-17 ENCOUNTER — Encounter: Payer: Self-pay | Admitting: Hematology

## 2022-09-17 VITALS — BP 112/73 | HR 107 | Temp 98.5°F | Resp 18 | Ht 67.0 in | Wt 160.6 lb

## 2022-09-17 DIAGNOSIS — E2839 Other primary ovarian failure: Secondary | ICD-10-CM | POA: Diagnosis not present

## 2022-09-17 DIAGNOSIS — F17210 Nicotine dependence, cigarettes, uncomplicated: Secondary | ICD-10-CM | POA: Diagnosis not present

## 2022-09-17 DIAGNOSIS — Z170 Estrogen receptor positive status [ER+]: Secondary | ICD-10-CM

## 2022-09-17 DIAGNOSIS — Z79899 Other long term (current) drug therapy: Secondary | ICD-10-CM | POA: Insufficient documentation

## 2022-09-17 DIAGNOSIS — C50211 Malignant neoplasm of upper-inner quadrant of right female breast: Secondary | ICD-10-CM

## 2022-09-17 DIAGNOSIS — Z803 Family history of malignant neoplasm of breast: Secondary | ICD-10-CM

## 2022-09-17 DIAGNOSIS — Z1501 Genetic susceptibility to malignant neoplasm of breast: Secondary | ICD-10-CM

## 2022-09-17 LAB — CBC WITH DIFFERENTIAL (CANCER CENTER ONLY)
Abs Immature Granulocytes: 0.02 10*3/uL (ref 0.00–0.07)
Basophils Absolute: 0 10*3/uL (ref 0.0–0.1)
Basophils Relative: 0 %
Eosinophils Absolute: 0 10*3/uL (ref 0.0–0.5)
Eosinophils Relative: 0 %
HCT: 42.7 % (ref 36.0–46.0)
Hemoglobin: 14.6 g/dL (ref 12.0–15.0)
Immature Granulocytes: 0 %
Lymphocytes Relative: 32 %
Lymphs Abs: 2.7 10*3/uL (ref 0.7–4.0)
MCH: 31.2 pg (ref 26.0–34.0)
MCHC: 34.2 g/dL (ref 30.0–36.0)
MCV: 91.2 fL (ref 80.0–100.0)
Monocytes Absolute: 0.5 10*3/uL (ref 0.1–1.0)
Monocytes Relative: 6 %
Neutro Abs: 5.4 10*3/uL (ref 1.7–7.7)
Neutrophils Relative %: 62 %
Platelet Count: 297 10*3/uL (ref 150–400)
RBC: 4.68 MIL/uL (ref 3.87–5.11)
RDW: 13.2 % (ref 11.5–15.5)
WBC Count: 8.7 10*3/uL (ref 4.0–10.5)
nRBC: 0 % (ref 0.0–0.2)

## 2022-09-17 LAB — CMP (CANCER CENTER ONLY)
ALT: 26 U/L (ref 0–44)
AST: 22 U/L (ref 15–41)
Albumin: 4.3 g/dL (ref 3.5–5.0)
Alkaline Phosphatase: 100 U/L (ref 38–126)
Anion gap: 6 (ref 5–15)
BUN: 15 mg/dL (ref 8–23)
CO2: 28 mmol/L (ref 22–32)
Calcium: 9.4 mg/dL (ref 8.9–10.3)
Chloride: 106 mmol/L (ref 98–111)
Creatinine: 0.87 mg/dL (ref 0.44–1.00)
GFR, Estimated: >60 mL/min (ref >60)
Glucose, Bld: 95 mg/dL (ref 70–99)
Potassium: 4 mmol/L (ref 3.5–5.1)
Sodium: 140 mmol/L (ref 135–145)
Total Bilirubin: 0.8 mg/dL (ref 0.3–1.2)
Total Protein: 7 g/dL (ref 6.5–8.1)

## 2022-09-17 LAB — GENETIC SCREENING ORDER

## 2022-09-17 MED ORDER — KETOROLAC TROMETHAMINE 15 MG/ML IJ SOLN: 15.0000 mg | Freq: Once | INTRAMUSCULAR | Status: AC

## 2022-09-17 NOTE — Progress Notes (Signed)
[facility] Spiritual Care Note  Visited with Jean Williams and her sister at [facility], providing introduction to common feelings related to cancer diagnosis and treatment, and to both Spiritual Care and broader [facility]/Patient and Family Support Team. Her sister, who lives in [place], notes that she had a very similar diagnosis herself. They were appreciative to learn of support programming and resources.  Family has direct Spiritual Care number and plans to reach out as needed/desired.   [place], [place], [facility] Pager [phone] Voicemail [phone]

## 2022-09-17 NOTE — Progress Notes (Signed)
[place]   Telephone:(336) [phone] Fax:([phone]) [place] Note   Patient Care Team: Gerrit Heck, MD as PCP - General (Family Medicine) Truitt Merle, MD as Consulting Physician (Hematology) Jovita Kussmaul, MD as Consulting Physician (General Surgery) Gery Pray, MD as Consulting Physician (Radiation Oncology) Mauro Kaufmann, RN as Oncology Nurse Navigator Rockwell Germany, RN as Registered Nurse  Date of Service:  09/17/2022   CHIEF COMPLAINTS/PURPOSE OF CONSULTATION:  Right  Breast Cancer  REFERRING PHYSICIAN:  [facility]   ASSESSMENT & PLAN:  Jean Williams is a 62 y.o. post-menopausal female with a history of   Malignant neoplasm of upper-inner quadrant of right breast in female, estrogen receptor positive ([place]) --We discussed her imaging findings and the biopsy results in great details. -Giving the early stage disease, she is a candidate for lumpectomy. She is agreeable with that. She was seen by Dr. Marlou Starks today and likely will proceed with surgery soon.  -Given the small size of tumor, she does not need Oncotype or adjuvant chemotherapy. --Giving the strong ER and PR expression in her postmenopausal status, I recommend adjuvant endocrine therapy with aromatase inhibitor for a total of 5-10 years to reduce the risk of cancer recurrence. Potential benefits and side effects were discussed with patient and she is interested. -She was also seen by radiation oncologist Dr. Sondra Come today.  Adjuvant radiation was discussed and recommended. -We also discussed the breast cancer surveillance after her surgery. She will continue annual screening mammogram, self exam, and a routine office visit with lab and exam with [place]. -I encouraged her to have healthy diet and exercise regularly.     PLAN:  -She will proceed right lumpectomy with Dr. Marlou Starks soon  -I ordered a bone density scan to be done in next few months -She will proceed to adjuvant  radiation after surgery  -I will see her back the last week of her radiation, to finalize her antiestrogen therapy.  Likely recommend anastrozole.   Oncology History Overview Note   Cancer Staging  Malignant neoplasm of upper-inner quadrant of right breast in female, estrogen receptor positive ([place]) Staging form: Breast, AJCC 8th Edition - Clinical stage from 09/09/2022: Stage IA (cT1a, cN0, cM0, G1, ER+, PR+, HER2-) - Signed by Truitt Merle, MD on 09/16/2022 Stage prefix: Initial diagnosis Histologic grading system: 3 grade system     Malignant neoplasm of upper-inner quadrant of right breast in female, estrogen receptor positive ([place])  09/09/2022 Cancer Staging   Staging form: Breast, AJCC 8th Edition - Clinical stage from 09/09/2022: Stage IA (cT1a, cN0, cM0, G1, ER+, PR+, HER2-) - Signed by Truitt Merle, MD on 09/16/2022 Stage prefix: Initial diagnosis Histologic grading system: 3 grade system   09/10/2022 Pathology Results    Pathology revealed 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   09/16/2022 Initial Diagnosis   Malignant neoplasm of upper-inner quadrant of right breast in female, estrogen receptor positive ([place])      HISTORY OF PRESENTING ILLNESS:  Jean Williams 62 y.o. female is a here because of breast cancer. The patient was [facility] by [facility]. The patient presents to the clinic today accompanied by Sister and Administrator from her independent living facility.  She had routine screening mammography on 08/20/2022 showing a possible abnormality in the right breast. She underwent right diagnostic mammography and  breast ultrasonography on 09/03/2022 showing a 5 x 5 x 3 mm irregular  mass at the 3 o'clock position of the right breast, 3 cm from nipple.  Biopsy on  showed: 09/10/2022. Prognostic indicators significant for: estrogen receptor, 100% positive and progesterone receptor, 20% positive. Proliferation marker Ki67 at 5%. HER2   negative.  Patient has schizophrenia since teenager, she lives in a independent living today.  She does work at the [employer].  She feels well overall, no new symptoms.   She has a PMHx of.... Anxiety GERD Emphysema COPD Two Sister- breast cancer  Socially... Single 1 child  GYN HISTORY  Menarchal: xx LMP: xx Contraceptive: HRT:  GP:G1,P1    REVIEW OF SYSTEMS:    Constitutional: Denies fevers, chills or abnormal night sweats Eyes: Denies blurriness of vision, double vision or watery eyes Ears, nose, mouth, throat, and face: Denies mucositis or sore throat Respiratory: Denies cough, dyspnea or wheezes Cardiovascular: Denies palpitation, chest discomfort or lower extremity swelling Gastrointestinal:  Denies nausea, heartburn or change in bowel habits Skin: Denies abnormal skin rashes Lymphatics: Denies new lymphadenopathy or easy bruising Neurological:Denies numbness, tingling or new weaknesses Behavioral/Psych: Mood is stable, no new changes  Schizophrenia  All other systems were reviewed with the patient and are negative.   MEDICAL HISTORY:  Past Medical History:  Diagnosis Date   Chronic constipation    Eczema    Emphysema of lung ([place])    in epic CT angio Chest-- 01-24-2018   GAD (generalized anxiety disorder)    GERD (gastroesophageal reflux disease)    History of adenomatous polyp of colon    History of basal cell carcinoma (BCC) excision    05-13-2011   s/p moh's left upper lip   History of panic attacks    Hyperlipidemia    Psoriasis    Rectal polyp    Schizophrenia ([place])    followed by [facility] , dr Andree Elk   Seasonal allergic rhinitis    Tachycardia    followed by pcp, first dx 2015, no meds for this,  last EKG in epic dated 02-09-2018    SURGICAL HISTORY: Past Surgical History:  Procedure Laterality Date   BREAST BIOPSY Right 09/09/2022   [place] RT BREAST BX W LOC DEV 1ST LESION IMG BX SPEC US GUIDE 09/09/2022 [facility] MAMMOGRAPHY    COLONOSCOPY     TRANSANAL EXCISION OF RECTAL MASS N/A 03/17/2019   Procedure: TRANSANAL EXCISION OF RECTAL POLYP;  Surgeon: Leighton Ruff, MD;  Location: [place];  Service: General;  Laterality: N/A;    SOCIAL HISTORY: Social History   Socioeconomic History   Marital status: Single    Spouse name: Not on file   Number of children: 1   Years of education: GED   Highest education level: GED or equivalent  Occupational History   Occupation: Conservation officer, historic buildings: [employer]    Comment: Sundays-Tuesdays-Thursdays for 4 hours  Tobacco Use   Smoking status: Every Day    Packs/day: 1.00    Years: 40.00    Total pack years: 40.00    Types: Cigarettes    Passive exposure: Current   Smokeless tobacco: Never   Tobacco comments:    1 PPD  Vaping Use   Vaping Use: Never used  Substance and Sexual Activity   Alcohol use: No    Alcohol/week: 0.0 standard drinks of alcohol   Drug use: Not Currently    Types: Marijuana    Comment: yrs ago   Sexual activity: Not Currently    Birth control/protection: Post-menopausal  Other Topics Concern  Not on file  Social History Narrative   Patients lives in an assisted living in [facility].   Patient has legal guardians/financial- sister Karena [place] and brother Elta [place].    Patient uses [facility] for transportation.    Patient has one daughter in [place]. Patient reports good contact, however "sporadic."    Patient works 3 days per week at [employer] and really enjoys it.    Patient is active at [facility] and has "lots" of friends.    Social Determinants of Health   Financial Resource Strain: Low Risk  (08/21/2021)   Overall Financial Resource Strain (CARDIA)    Difficulty of Paying Living Expenses: Not very hard  Food Insecurity: No Food Insecurity (08/21/2021)   Hunger Vital Sign    Worried About Running Out of Food in the Last Year: Never true    Ran Out of Food in the Last Year: Never true  Transportation  Needs: No Transportation Needs (08/21/2021)   PRAPARE - Hydrologist (Medical): No    Lack of Transportation (Non-Medical): No  Physical Activity: Inactive (08/21/2021)   Exercise Vital Sign    Days of Exercise per Week: 0 days    Minutes of Exercise per Session: 0 min  Stress: No Stress Concern Present (08/21/2021)   [place]    Feeling of Stress : Not at all  Social Connections: Moderately Integrated (08/21/2021)   Social Connection and Isolation Panel [NHANES]    Frequency of Communication with Friends and Family: More than three times a week    Frequency of Social Gatherings with Friends and Family: More than three times a week    Attends Religious Services: 1 to 4 times per year    Active Member of [employer] or Organizations: Yes    Attends Music therapist: More than 4 times per year    Marital Status: Never married  Intimate Partner Violence: Not At Risk (08/21/2021)   Humiliation, Afraid, Rape, and Kick questionnaire    Fear of Current or Ex-Partner: No    Emotionally Abused: No    Physically Abused: No    Sexually Abused: No    FAMILY HISTORY: Family History  Problem Relation Age of Onset   Cancer Sister        32   Breast cancer Sister    Cancer Sister        63   Cancer Maternal Grandmother    Breast cancer Maternal Grandmother    Stroke Other    Heart failure Other    Colon cancer Neg Hx    Colon polyps Neg Hx    Rectal cancer Neg Hx    Stomach cancer Neg Hx    Esophageal cancer Neg Hx     ALLERGIES:  is allergic to penicillins.  MEDICATIONS:  Current Outpatient Medications  Medication Sig Dispense Refill   acetaminophen (TYLENOL) 325 MG tablet Take 2 tablets (650 mg total) by mouth every 6 (six) hours as needed. 30 tablet 0   albuterol (VENTOLIN HFA) 108 (90 Base) MCG/ACT inhaler Inhale 1-2 puffs into the lungs every 6 (six) hours as needed for wheezing  or shortness of breath. 2 each 2   atorvastatin (LIPITOR) 40 MG tablet Take 1 tablet (40 mg total) by mouth at bedtime. 90 tablet 3   betamethasone valerate ointment (VALISONE) 0.1 % Apply 1 Application topically 2 (two) times daily. 30 g 0   carboxymethylcellul-glycerin (OPTIVE) 0.5-0.9 %  ophthalmic solution Place 1 drop into both eyes 4 (four) times daily. Cannot self-administer (Patient taking differently: Place 1 drop into both eyes 4 (four) times daily. Cannot self-administer) 15 mL 0   cloZAPine (CLOZARIL) 100 MG tablet Take 1 tablet (100 mg total) by mouth 2 (two) times daily. Take 1/2 tab every am & 3 tabs every evening (Patient taking differently: Take 300 mg by mouth at bedtime.) 105 tablet 3   docusate sodium (COLACE) 100 MG capsule Take 100 mg by mouth 2 (two) times daily.     famotidine (PEPCID) 20 MG tablet Take 1 tablet (20 mg total) by mouth 2 (two) times daily. 60 tablet 0   Multiple Vitamins-Minerals (CERTAVITE/ANTIOXIDANTS) TABS TAKE 1 TABLET BY MOUTH ONCE DAILY. REPLACES [place] (Patient taking differently: Take 1 tablet by mouth daily.) 30 tablet 0   Oyster Shell 500 MG TABS Take 1 tablet (500 mg total) by mouth 2 (two) times daily. 60 tablet 0   PARoxetine (PAXIL) 30 MG tablet TAKE 1 TABLET BY MOUTH IN THE MORNING. (Patient taking differently: Take 30 mg by mouth daily.) 30 tablet 0   polyethylene glycol powder (GLYCOLAX/MIRALAX) 17 GM/SCOOP powder Take 17 g by mouth 2 (two) times daily as needed. 3350 g 1   triamcinolone ointment (KENALOG) 0.1 % Apply 1 Application topically 2 (two) times daily. 30 g 1   No current facility-administered medications for this visit.   Facility-Administered Medications Ordered in Other Visits  Medication Dose Route Frequency Provider Last Rate Last Admin   ketorolac (TORADOL) 15 MG/ML injection 15 mg  15 mg Intravenous Once Autumn Messing III, MD        PHYSICAL EXAMINATION: ECOG PERFORMANCE STATUS: 1 - Symptomatic but completely  ambulatory  Vitals:   09/17/22 1239  BP: 112/73  Pulse: (!) 107  Resp: 18  Temp: 98.5 F (36.9 C)  SpO2: 98%   Filed Weights   09/17/22 1239  Weight: 160 lb 9.6 oz (72.8 kg)    LYMPH:  (-)no palpable lymphadenopathy in the cervical, axillary  LUNGS:(+)  clear to auscultation and percussion with normal breathing effort ABDOMEN:(-) abdomen soft, non-tender and normal bowel sounds BREAST:  Left breast  No palpable mass, nodules or adenopathy bilaterally. Breast exam benign.Rt breast exam showed a 1.5cm mass at 2:00 position with ecchymosis at the biopsy site.  LABORATORY DATA:  I have reviewed the data as listed    Latest Ref Rng & Units 09/17/2022   11:49 AM 08/28/2021   10:13 AM 11/01/2019    3:12 PM  CBC  WBC 4.0 - 10.5 K/uL 8.7  7.4  8.8   Hemoglobin 12.0 - 15.0 g/dL 14.6  14.4  15.4   Hematocrit 36.0 - 46.0 % 42.7  41.9  45.2   Platelets 150 - 400 K/uL 297  303  299        Latest Ref Rng & Units 09/17/2022   11:49 AM 11/01/2019    3:12 PM 04/26/2019    2:28 PM  CMP  Glucose 70 - 99 mg/dL 95  83  100   BUN 8 - 23 mg/dL '15  12  11   '$ Creatinine 0.44 - 1.00 mg/dL 0.87  0.82  0.94   Sodium 135 - 145 mmol/L 140  141  143   Potassium 3.5 - 5.1 mmol/L 4.0  4.4  4.3   Chloride 98 - 111 mmol/L 106  104  105   CO2 22 - 32 mmol/L '28  24  25   '$ Calcium  8.9 - 10.3 mg/dL 9.4  9.8  9.5   Total Protein 6.5 - 8.1 g/dL 7.0  6.7    Total Bilirubin 0.3 - 1.2 mg/dL 0.8  0.5    Alkaline Phos 38 - 126 U/L 100  118    AST 15 - 41 U/L 22  18    ALT 0 - 44 U/L 26  24       RADIOGRAPHIC STUDIES: I have personally reviewed the radiological images as listed and agreed with the findings in the report. [place] RT BREAST BX W LOC DEV 1ST LESION IMG BX SPEC US GUIDE  Addendum Date: 09/11/2022   ADDENDUM REPORT: 09/11/2022 07:29 ADDENDUM: Pathology revealed 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 This was found to be concordant by Dr. Dorise Bullion.  Pathology results were discussed with Alphonzo Dublin, Administrator at [facility] by telephone, per request. My direct phone number was provided. The patient was referred to [facility] on September 17, 2022. Pathology results reported by Terie Purser, RN on 09/10/2022. Electronically Signed   By: Dorise Bullion III M.D.   On: 09/11/2022 07:29   Result Date: 09/11/2022 CLINICAL DATA:  Biopsy of a 3 o'clock right breast mass EXAM: ULTRASOUND GUIDED RIGHT BREAST CORE NEEDLE BIOPSY COMPARISON:  Previous exam(s). PROCEDURE: I met with the patient and we discussed the procedure of ultrasound-guided biopsy, including benefits and alternatives. We discussed the high likelihood of a successful procedure. We discussed the risks of the procedure, including infection, bleeding, tissue injury, clip migration, and inadequate sampling. Informed written consent was given. The usual time-out protocol was performed immediately prior to the procedure. Lesion quadrant: 3 o'clock right breast Using sterile technique and 1% Lidocaine as local anesthetic, under direct ultrasound visualization, a 12 gauge spring-loaded device was used to perform biopsy of a 3 o'clock right breast mass using a medial approach. At the conclusion of the procedure a coil shaped tissue marker clip was deployed into the biopsy cavity. Follow up 2 view mammogram was performed and dictated separately. IMPRESSION: Ultrasound guided biopsy of a right breast mass as above. No apparent complications. Electronically Signed: By: Dorise Bullion III M.D. On: 09/09/2022 10:20  MM CLIP PLACEMENT RIGHT  Result Date: 09/09/2022 CLINICAL DATA:  Evaluate biopsy marker EXAM: 3D DIAGNOSTIC RIGHT MAMMOGRAM POST ULTRASOUND BIOPSY COMPARISON:  Previous exam(s). FINDINGS: 3D Mammographic images were obtained following ultrasound guided biopsy of a small spiculated right breast mass. The biopsy  marking clip is in expected position at the site of biopsy. IMPRESSION: Appropriate positioning of the coil shaped biopsy marking clip at the site of biopsy in the region of the biopsied right breast mass. Final Assessment: Post Procedure Mammograms for Marker Placement Electronically Signed   By: Dorise Bullion III M.D.   On: 09/09/2022 10:40  MM DIAG BREAST TOMO UNI RIGHT  Result Date: 09/03/2022 CLINICAL DATA:  Possible clustered masses in the anteromedial right breast on a recent screening mammogram. EXAM: DIGITAL DIAGNOSTIC UNILATERAL RIGHT MAMMOGRAM WITH TOMOSYNTHESIS; ULTRASOUND RIGHT BREAST LIMITED TECHNIQUE: Right digital diagnostic mammography and breast tomosynthesis was performed.; Targeted ultrasound examination of the right breast was performed COMPARISON:  Previous exam(s). ACR Breast Density Category b: There are scattered areas of fibroglandular density. FINDINGS: Chronically dilated medial retroareolar ducts on the right in the area of the recently suspected clustered masses. There is also a small spiculated mass more medially in  the right breast. On physical exam, no mass is palpable in the medial right breast. Targeted ultrasound is performed, showing 5 x 5 x 3 mm irregular, heterogeneous mass with posterior acoustical shadowing in the 3 o'clock position of the right breast, 3 cm from the nipple. This corresponds to the small spiculated mass seen on the mammogram images. Also demonstrated are multiple dilated retroareolar ducts in the 2 o'clock position of the right breast with no intraductal masses seen. Ultrasound of the right axilla demonstrated normal appearing right axillary lymph nodes. IMPRESSION: 1. 5 mm mass in the 3 o'clock position of the right breast with imaging features suspicious for malignancy. 2. Benign right breast duct ectasia. RECOMMENDATION: Ultrasound-guided core needle biopsy of the 5 mm mass in the 3 o'clock position of the right breast. This has been discussed with  the patient and scheduled at 9:30 a.m. on 09/09/2022. I have discussed the findings and recommendations with the patient. If applicable, a reminder letter will be sent to the patient regarding the next appointment. BI-RADS CATEGORY  4: Suspicious. Electronically Signed   By: Claudie Revering M.D.   On: 09/03/2022 09:57  US BREAST LTD UNI RIGHT INC AXILLA  Result Date: 09/03/2022 CLINICAL DATA:  Possible clustered masses in the anteromedial right breast on a recent screening mammogram. EXAM: DIGITAL DIAGNOSTIC UNILATERAL RIGHT MAMMOGRAM WITH TOMOSYNTHESIS; ULTRASOUND RIGHT BREAST LIMITED TECHNIQUE: Right digital diagnostic mammography and breast tomosynthesis was performed.; Targeted ultrasound examination of the right breast was performed COMPARISON:  Previous exam(s). ACR Breast Density Category b: There are scattered areas of fibroglandular density. FINDINGS: Chronically dilated medial retroareolar ducts on the right in the area of the recently suspected clustered masses. There is also a small spiculated mass more medially in the right breast. On physical exam, no mass is palpable in the medial right breast. Targeted ultrasound is performed, showing 5 x 5 x 3 mm irregular, heterogeneous mass with posterior acoustical shadowing in the 3 o'clock position of the right breast, 3 cm from the nipple. This corresponds to the small spiculated mass seen on the mammogram images. Also demonstrated are multiple dilated retroareolar ducts in the 2 o'clock position of the right breast with no intraductal masses seen. Ultrasound of the right axilla demonstrated normal appearing right axillary lymph nodes. IMPRESSION: 1. 5 mm mass in the 3 o'clock position of the right breast with imaging features suspicious for malignancy. 2. Benign right breast duct ectasia. RECOMMENDATION: Ultrasound-guided core needle biopsy of the 5 mm mass in the 3 o'clock position of the right breast. This has been discussed with the patient and scheduled  at 9:30 a.m. on 09/09/2022. I have discussed the findings and recommendations with the patient. If applicable, a reminder letter will be sent to the patient regarding the next appointment. BI-RADS CATEGORY  4: Suspicious. Electronically Signed   By: Claudie Revering M.D.   On: 09/03/2022 09:57  MM 3D SCREEN BREAST BILATERAL  Result Date: 08/21/2022 CLINICAL DATA:  Screening. EXAM: DIGITAL SCREENING BILATERAL MAMMOGRAM WITH TOMOSYNTHESIS AND CAD TECHNIQUE: Bilateral screening digital craniocaudal and mediolateral oblique mammograms were obtained. Bilateral screening digital breast tomosynthesis was performed. The images were evaluated with computer-aided detection. COMPARISON:  Previous exam(s). ACR Breast Density Category b: There are scattered areas of fibroglandular density. FINDINGS: In the right breast, possible clustered masses warrant further evaluation. In the left breast, no findings suspicious for malignancy. IMPRESSION: Further evaluation is suggested for a possible clustered masses in the right breast. RECOMMENDATION: Diagnostic mammogram and possibly ultrasound of  the right breast. (Code:[id number]) The patient will be contacted regarding the findings, and additional imaging will be scheduled. BI-RADS CATEGORY  0: Incomplete: Need additional imaging evaluation. Electronically Signed   By: Ammie Ferrier M.D.   On: 08/21/2022 13:00     Orders Placed This Encounter  Procedures   DG Bone Density    MCR PREV:NONE  PT WT:170 LBS  PT AWARE NO CAL/MULTI 55 HRS PRIOR/ NO NEEDS  AJ SW AMMA '@NURSING'$  HOME  PT AWARE $75 NO SHOW/CANCELLATION FEE WITHIN 24 HOURS  EPIC ORDER    Standing Status:   Future    Standing Expiration Date:   09/17/2023    Order Specific Question:   Reason for Exam (SYMPTOM  OR DIAGNOSIS REQUIRED)    Answer:   screening    Order Specific Question:   Preferred imaging location?    Answer:   [facility]    All questions were answered. The patient knows to call the clinic  with any problems, questions or concerns. The total time spent in the appointment was 45 minutes.     Truitt Merle, MD 09/17/2022 5:08 PM  I, Audry Riles, am acting as scribe for Truitt Merle, MD.   I have reviewed the above documentation for accuracy and completeness, and I agree with the above.

## 2022-09-17 NOTE — Assessment & Plan Note (Signed)
--  We discussed her imaging findings and the biopsy results in great details. -Giving the early stage disease, she is a candidate for lumpectomy. She is agreeable with that. She was seen by Dr. Marlou Starks today and likely will proceed with surgery soon.  -Given the small size of tumor, she does not need Oncotype or adjuvant chemotherapy. --Giving the strong ER and PR expression in her postmenopausal status, I recommend adjuvant endocrine therapy with aromatase inhibitor for a total of 5-10 years to reduce the risk of cancer recurrence. Potential benefits and side effects were discussed with patient and she is interested. -She was also seen by radiation oncologist Dr. Sondra Come today.  Adjuvant radiation was discussed and recommended. -We also discussed the breast cancer surveillance after her surgery. She will continue annual screening mammogram, self exam, and a routine office visit with lab and exam with [place]. -I encouraged her to have healthy diet and exercise regularly.

## 2022-09-18 ENCOUNTER — Ambulatory Visit
Admission: RE | Admit: 2022-09-18 | Discharge: 2022-09-18 | Disposition: A | Discharge status: Home / Self Care | Payer: Medicare Other | Source: Ambulatory Visit | Attending: Hematology | Admitting: Hematology

## 2022-09-18 ENCOUNTER — Other Ambulatory Visit: Payer: Self-pay

## 2022-09-18 ENCOUNTER — Encounter: Payer: Self-pay | Admitting: Genetic Counselor

## 2022-09-18 DIAGNOSIS — Z803 Family history of malignant neoplasm of breast: Secondary | ICD-10-CM | POA: Insufficient documentation

## 2022-09-18 DIAGNOSIS — E2839 Other primary ovarian failure: Secondary | ICD-10-CM

## 2022-09-18 MED ORDER — DOCUSATE SODIUM 100 MG PO CAPS: 100.0000 mg | ORAL_CAPSULE | Freq: Two times a day (BID) | ORAL | 0 refills | Status: DC | PRN

## 2022-09-18 MED ORDER — FAMOTIDINE 20 MG PO TABS: 20.0000 mg | ORAL_TABLET | Freq: Two times a day (BID) | ORAL | 0 refills | Status: DC

## 2022-09-18 MED ORDER — LORATADINE 10 MG PO TABS: 10.0000 mg | ORAL_TABLET | Freq: Every day | ORAL | 11 refills | Status: DC

## 2022-09-18 MED ORDER — REFRESH OPTIVE 0.5-0.9 % OP SOLN: 1.0000 [drp] | Freq: Four times a day (QID) | OPHTHALMIC | 0 refills | Status: DC

## 2022-09-18 NOTE — Progress Notes (Signed)
REFERRING PROVIDER: Truitt Merle, MD  PRIMARY PROVIDER:  Gerrit Heck, MD  PRIMARY REASON FOR VISIT:  1. Malignant neoplasm of upper-inner quadrant of right breast in female, estrogen receptor positive ([place])   2. Family history of breast cancer     HISTORY OF PRESENT ILLNESS:   Jean Williams, a 62 y.o. female, was seen for a [facility] cancer genetics consultation at the request of Dr. Burr Medico due to a personal and family history of cancer.  Ms. Gotcher presents to clinic today to discuss the possibility of a hereditary predisposition to cancer, to discuss genetic testing, and to further clarify her future cancer risks, as well as potential cancer risks for family members.   In March 2024, at the age of 63, Ms. 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  CANCER HISTORY:  Oncology History Overview Note   Cancer Staging  Malignant neoplasm of upper-inner quadrant of right breast in female, estrogen receptor positive ([place]) Staging form: Breast, AJCC 8th Edition - Clinical stage from 09/09/2022: Stage IA (cT1a, cN0, cM0, G1, ER+, PR+, HER2-) - Signed by Truitt Merle, MD on 09/16/2022 Stage prefix: Initial diagnosis Histologic grading system: 3 grade system     Malignant neoplasm of upper-inner quadrant of right breast in female, estrogen receptor positive ([place])  09/09/2022 Cancer Staging   Staging form: Breast, AJCC 8th Edition - Clinical stage from 09/09/2022: Stage IA (cT1a, cN0, cM0, G1, ER+, PR+, HER2-) - Signed by Truitt Merle, MD on 09/16/2022 Stage prefix: Initial diagnosis Histologic grading system: 3 grade system   09/10/2022 Pathology Results    Pathology revealed 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   09/16/2022 Initial Diagnosis   Malignant neoplasm of upper-inner quadrant of right breast in female, estrogen receptor positive ([place])      Past Medical History:  Diagnosis Date    Chronic constipation    Eczema    Emphysema of lung ([place])    in epic CT angio Chest-- 01-24-2018   GAD (generalized anxiety disorder)    GERD (gastroesophageal reflux disease)    History of adenomatous polyp of colon    History of basal cell carcinoma (BCC) excision    05-13-2011   s/p moh's left upper lip   History of panic attacks    Hyperlipidemia    Psoriasis    Rectal polyp    Schizophrenia ([place])    followed by [facility] , dr Andree Elk   Seasonal allergic rhinitis    Tachycardia    followed by pcp, first dx 2015, no meds for this,  last EKG in epic dated 02-09-2018    Past Surgical History:  Procedure Laterality Date   BREAST BIOPSY Right 09/09/2022   [place] RT BREAST BX W LOC DEV 1ST LESION IMG BX SPEC US GUIDE 09/09/2022 [facility] MAMMOGRAPHY   COLONOSCOPY     TRANSANAL EXCISION OF RECTAL MASS N/A 03/17/2019   Procedure: TRANSANAL EXCISION OF RECTAL POLYP;  Surgeon: Leighton Ruff, MD;  Location: [facility];  Service: General;  Laterality: N/A;    Social History   Socioeconomic History   Marital status: Single    Spouse name: Not on file   Number of children: 1   Years of education: GED   Highest education level: GED or equivalent  Occupational History   Occupation: Conservation officer, historic buildings: [employer]    Comment: Sundays-Tuesdays-Thursdays for 4 hours  Tobacco Use  Smoking status: Every Day    Packs/day: 1.00    Years: 40.00    Additional pack years: 0.00    Total pack years: 40.00    Types: Cigarettes    Passive exposure: Current   Smokeless tobacco: Never   Tobacco comments:    1 PPD  Vaping Use   Vaping Use: Never used  Substance and Sexual Activity   Alcohol use: No    Alcohol/week: 0.0 standard drinks of alcohol   Drug use: Not Currently    Types: Marijuana    Comment: yrs ago   Sexual activity: Not Currently    Birth control/protection: Post-menopausal  Other Topics Concern   Not on file  Social History Narrative   Patients lives in  an assisted living in [place].   Patient has legal guardians/financial- sister Karena Addison and brother Elta [place]oupe.    Patient uses [employer] for transportation.    Patient has one daughter in [place]. Patient reports good contact, however "sporadic."    Patient works 3 days per week at [employer] and really enjoys it.    Patient is active at [facility] and has "lots" of friends.    Social Determinants of Health   Financial Resource Strain: Low Risk  (08/21/2021)   Overall Financial Resource Strain (CARDIA)    Difficulty of Paying Living Expenses: Not very hard  Food Insecurity: No Food Insecurity (08/21/2021)   Hunger Vital Sign    Worried About Running Out of Food in the Last Year: Never true    Ran Out of Food in the Last Year: Never true  Transportation Needs: No Transportation Needs (08/21/2021)   PRAPARE - Hydrologist (Medical): No    Lack of Transportation (Non-Medical): No  Physical Activity: Inactive (08/21/2021)   Exercise Vital Sign    Days of Exercise per Week: 0 days    Minutes of Exercise per Session: 0 min  Stress: No Stress Concern Present (08/21/2021)   [place]    Feeling of Stress : Not at all  Social Connections: Moderately Integrated (08/21/2021)   Social Connection and Isolation Panel [NHANES]    Frequency of Communication with Friends and Family: More than three times a week    Frequency of Social Gatherings with Friends and Family: More than three times a week    Attends Religious Services: 1 to 4 times per year    Active Member of [employer] or Organizations: Yes    Attends Music therapist: More than 4 times per year    Marital Status: Never married     FAMILY HISTORY:  We obtained a detailed, 4-generation family history.  Significant diagnoses are listed below: Family History  Problem Relation Age of Onset   Breast cancer Sister 50       BRCA1/2  negative   Breast cancer Sister 29   Breast cancer Maternal Grandmother    Stroke Other    Heart failure Other    Colon cancer Neg Hx    Colon polyps Neg Hx    Rectal cancer Neg Hx    Stomach cancer Neg Hx    Esophageal cancer Neg Hx      Ms. Bingle has three sisters and two have a history of breast cancer. One sister was diagnosed with breast cancer at age 55 and died at age 110 due to metastatic breast cancer and her second sister was diagnosed with breast cancer at age  28 and reportedly tested negative for BRCA1/2, she is living. Her maternal grandmother was diagnosed with breast cancer at an unknown age, she died in her 55s. There is no reported Ashkenazi Jewish ancestry.  GENETIC COUNSELING ASSESSMENT: Ms. Wennerstrom is a 62 y.o. female with a personal and family history of cancer which is somewhat suggestive of a hereditary predisposition to cancer given multiple individuals with a history of breast cancer. We, therefore, discussed and recommended the following at today's visit.   DISCUSSION: We discussed that 5 - 10% of cancer is hereditary, with most cases of breast cancer associated with BRCA1/2.  There are other genes that can be associated with hereditary breast cancer syndromes.  We discussed that testing is beneficial for several reasons including knowing how to follow individuals after completing their treatment, identifying whether potential treatment options would be beneficial, and understanding if other family members could be at risk for cancer and allowing them to undergo genetic testing.   We reviewed the characteristics, features and inheritance patterns of hereditary cancer syndromes. We also discussed genetic testing, including the appropriate family members to test, the process of testing, insurance coverage and turn-around-time for results. We discussed the implications of a negative, positive, carrier and/or variant of uncertain significant result. We recommended Ms. Cabera  pursue genetic testing for a panel that includes genes associated with breast cancer. She reports her results would not impact her surgical decision. Therefore, we will not order a STAT panel.  Ms. Kaplin elected to have [facility] Hereditary Breast and Gyn Cancers Guidelines-Based Panel. This panel offered by [facility] includes sequencing and/or deletion duplication testing of the following 19 genes: ATM, BARD1, BRCA1, BRCA2, BRIP1, CDH1, CHEK2, EPCAM (Deletion/duplication testing only), MLH1, MSH2, MSH6, NF1, PALB2, PMS2, PTEN, RAD51C, RAD51D, STK11, and TP53.  Based on Ms. Scibetta personal and family history of cancer, she meets medical criteria for genetic testing. Despite that she meets criteria, she may still have an out of pocket cost. We discussed that if her out of pocket cost for testing is over $100, the laboratory will call and confirm whether she wants to proceed with testing.  If the out of pocket cost of testing is less than $100 she will be billed by the genetic testing laboratory.   PLAN: After considering the risks, benefits, and limitations, Ms. Denys provided informed consent to pursue genetic testing and the blood sample was sent to [facility] for analysis of the Hereditary Breast and Gyn Cancers Guidelines-Based Panel. Results should be available within approximately 2-3 weeks' time, at which point they will be disclosed by telephone to Ms. Marcell, as will any additional recommendations warranted by these results. Ms. Bodine will receive a summary of her genetic counseling visit and a copy of her results once available. This information will also be available in Epic.   Ms. Wurts questions were answered to her satisfaction today. Our contact information was provided should additional questions or concerns arise. Thank you for the referral and allowing [place] to share in the care of your patient.   Lucille Passy, MS, [facility] Genetic Counselor [email or web address] (P)  [phone]  The patient was seen for a total of 20 minutes in face-to-face genetic counseling.  The patient brought her sister and group home caretaker. Drs. Lindi Adie and/or Burr Medico were available to discuss this case as needed.   _______________________________________________________________________ For Office Staff:  Number of people involved in session: 3 Was an Intern/ student involved with case: no

## 2022-09-22 ENCOUNTER — Telehealth: Payer: Self-pay

## 2022-09-22 ENCOUNTER — Other Ambulatory Visit: Payer: Self-pay | Admitting: General Surgery

## 2022-09-22 DIAGNOSIS — C50211 Malignant neoplasm of upper-inner quadrant of right female breast: Secondary | ICD-10-CM

## 2022-09-22 NOTE — Telephone Encounter (Addendum)
Called Assisted Living Admin to give results below as per Dr. Burr Medico.   ----- Message from Truitt Merle, MD sent at 09/22/2022  7:45 AM EDT ----- Please let pt know her result, and suggest her to take OTC calcium and vitD, will discuss more when I see her back, thanks   Truitt Merle

## 2022-09-24 ENCOUNTER — Other Ambulatory Visit: Payer: Self-pay

## 2022-09-24 DIAGNOSIS — L989 Disorder of the skin and subcutaneous tissue, unspecified: Secondary | ICD-10-CM

## 2022-09-24 MED ORDER — BETAMETHASONE VALERATE 0.1 % EX OINT: 1.0000 | TOPICAL_OINTMENT | Freq: Two times a day (BID) | CUTANEOUS | 0 refills | Status: DC

## 2022-09-24 MED ORDER — TRIAMCINOLONE ACETONIDE 0.1 % EX OINT: 1.0000 | TOPICAL_OINTMENT | Freq: Two times a day (BID) | CUTANEOUS | 1 refills | Status: DC

## 2022-09-24 NOTE — Telephone Encounter (Signed)
Received call from [place] in [facility] [facility])  Fluticasone nasal spray and Flovent inhaler not on current medication list.   She is requesting refills on these medications as well as betamethasone and triamcinolone ointment.   Please send refills to [facility].   Talbot Grumbling, RN

## 2022-09-25 ENCOUNTER — Encounter: Payer: Self-pay | Admitting: *Deleted

## 2022-09-25 ENCOUNTER — Telehealth: Payer: Self-pay | Admitting: *Deleted

## 2022-09-25 DIAGNOSIS — Z170 Estrogen receptor positive status [ER+]: Secondary | ICD-10-CM

## 2022-09-25 NOTE — Telephone Encounter (Signed)
Spoke with patient's care team member to follow up from [facility] 3/13 and assess navigation needs. They are aware of all patient appts and informed them they would get call soon to schedule follow up with D.r Kinard for after sx. Encouraged them to call with any further questions.

## 2022-09-30 ENCOUNTER — Encounter: Payer: Self-pay | Admitting: Student

## 2022-09-30 ENCOUNTER — Other Ambulatory Visit: Payer: Self-pay | Admitting: Student

## 2022-10-01 ENCOUNTER — Encounter: Payer: Self-pay | Admitting: Genetic Counselor

## 2022-10-01 ENCOUNTER — Telehealth: Payer: Self-pay | Admitting: Genetic Counselor

## 2022-10-01 DIAGNOSIS — Z1379 Encounter for other screening for genetic and chromosomal anomalies: Secondary | ICD-10-CM | POA: Insufficient documentation

## 2022-10-01 NOTE — Telephone Encounter (Signed)
I contacted Alphonzo Dublin from [name]'s group home to discuss her genetic testing results. I also called her sister, Santiago Glad, to discuss her results. No pathogenic variants were identified in the 19 genes analyzed. Detailed clinic note to follow.  The test report has been scanned into EPIC and is located under the Molecular Pathology section of the Results Review tab.  A portion of the result report is included below for reference.   Lucille Passy, MS, [facility] Genetic Counselor [email or web address] (P) [phone]

## 2022-10-03 ENCOUNTER — Encounter: Payer: Self-pay | Admitting: Genetic Counselor

## 2022-10-03 ENCOUNTER — Ambulatory Visit: Payer: Self-pay | Admitting: Genetic Counselor

## 2022-10-03 DIAGNOSIS — Z1379 Encounter for other screening for genetic and chromosomal anomalies: Secondary | ICD-10-CM

## 2022-10-03 NOTE — Progress Notes (Signed)
HPI:   Jean Williams was previously seen in the [facility] clinic due to a personal and family history of cancer and concerns regarding a hereditary predisposition to cancer. Please refer to our prior cancer genetics clinic note for more information regarding our discussion, assessment and recommendations, at the time. Jean Williams recent genetic test results were disclosed to her, as were recommendations warranted by these results. These results and recommendations are discussed in more detail below.  CANCER HISTORY:  Oncology History Overview Note   Cancer Staging  Malignant neoplasm of upper-inner quadrant of right breast in female, estrogen receptor positive ([place]) Staging form: Breast, AJCC 8th Edition - Clinical stage from 09/09/2022: Stage IA (cT1a, cN0, cM0, G1, ER+, PR+, HER2-) - Signed by Truitt Merle, MD on 09/16/2022 Stage prefix: Initial diagnosis Histologic grading system: 3 grade system     Malignant neoplasm of upper-inner quadrant of right breast in female, estrogen receptor positive ([employer])  09/09/2022 Cancer Staging   Staging form: Breast, AJCC 8th Edition - Clinical stage from 09/09/2022: Stage IA (cT1a, cN0, cM0, G1, ER+, PR+, HER2-) - Signed by Truitt Merle, MD on 09/16/2022 Stage prefix: Initial diagnosis Histologic grading system: 3 grade system   09/10/2022 Pathology Results    Pathology revealed 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   09/16/2022 Initial Diagnosis   Malignant neoplasm of upper-inner quadrant of right breast in female, estrogen receptor positive ([facility])    Genetic Testing   [facility] Hereditary Breast and Gyn Cancers Guidelines-Based Panel+RNA was Negative. Report date is 09/23/2022.  This panel offered by [facility] includes sequencing and/or deletion duplication testing of the following 19 genes: ATM, BARD1, BRCA1, BRCA2, BRIP1, CDH1, CHEK2, EPCAM (Deletion/duplication testing only), MLH1, MSH2, MSH6, NF1,  PALB2, PMS2, PTEN, RAD51C, RAD51D, STK11, and TP53.      FAMILY HISTORY:  We obtained a detailed, 4-generation family history.  Significant diagnoses are listed below:      Family History  Problem Relation Age of Onset   Breast cancer Sister 21        BRCA1/2 negative   Breast cancer Sister 47   Breast cancer Maternal Grandmother     Stroke Other     Heart failure Other     Colon cancer Neg Hx     Colon polyps Neg Hx     Rectal cancer Neg Hx     Stomach cancer Neg Hx     Esophageal cancer Neg Hx         Jean Williams has three sisters and two have a history of breast cancer. One sister was diagnosed with breast cancer at age 7 and died at age 29 due to metastatic breast cancer and her second sister was diagnosed with breast cancer at age 36 and reportedly tested negative for BRCA1/2, she is living. Her maternal grandmother was diagnosed with breast cancer at an unknown age, she died in her 26s. There is no reported Ashkenazi Jewish ancestry.  GENETIC TEST RESULTS:  The Hereditary Breast and Gyn Cancers Guidelines-Based Panel found no pathogenic mutations.   This panel offered by [facility] includes sequencing and/or deletion duplication testing of the following 19 genes: ATM, BARD1, BRCA1, BRCA2, BRIP1, CDH1, CHEK2, EPCAM (Deletion/duplication testing only), MLH1, MSH2, MSH6, NF1, PALB2, PMS2, PTEN, RAD51C, RAD51D, STK11, and TP53.   The test report has been scanned into EPIC and is located under the Molecular Pathology section of the Results Review tab.  A portion of  the result report is included below for reference. Genetic testing reported out on 09/23/2022.      Even though a pathogenic variant was not identified, possible explanations for her personal history of cancer may include: There may be no hereditary risk for cancer in the family. The cancers in Jean Williams and/or her family may be due to other genetic or environmental factors. There may be a gene mutation in one of these  genes that current testing methods cannot detect, but that chance is small. There could be another gene that has not yet been discovered, or that we have not yet tested, that is responsible for the cancer diagnoses in the family.   Therefore, it is important to remain in touch with cancer genetics in the future so that we can continue to offer Jean Williams the most up to date genetic testing.   ADDITIONAL GENETIC TESTING:  We discussed with Jean Williams that her genetic testing was fairly extensive.  If there are genes identified to increase cancer risk that can be analyzed in the future, we would be happy to discuss and coordinate this testing at that time.     CANCER SCREENING RECOMMENDATIONS:  Jean Williams test result is considered negative (normal).  This means that we have not identified a hereditary cause for her personal and family history of cancer at this time.   An individual's cancer risk and medical management are not determined by genetic test results alone. Overall cancer risk assessment incorporates additional factors, including personal medical history, family history, and any available genetic information that may result in a personalized plan for cancer prevention and surveillance. Therefore, it is recommended she continue to follow the cancer management and screening guidelines provided by her oncology and primary healthcare provider.  RECOMMENDATIONS FOR FAMILY MEMBERS:   Since she did not inherit a mutation in a cancer predisposition gene included on this panel, her daughter could not have inherited a mutation from her in one of these genes. Individuals in this family might be at some increased risk of developing cancer, over the general population risk, due to the family history of cancer. We recommend women in this family have a yearly mammogram beginning at age 50, or 52 years younger than the earliest onset of cancer, an annual clinical breast exam, and perform monthly breast  Williams-exams.  Other members of the family may still carry a pathogenic variant in one of these genes that Jean Williams did not inherit. Based on the family history, we recommend her sister who had breast cancer have more comprehensive genetic testing.   FOLLOW-UP:  Cancer genetics is a rapidly advancing field and it is possible that new genetic tests will be appropriate for her and/or her family members in the future. We encouraged her to remain in contact with cancer genetics on an annual basis so we can update her personal and family histories and let her know of advances in cancer genetics that may benefit this family.   Our contact number was provided. Ms. Gann questions were answered to her satisfaction, and she knows she is welcome to call us at anytime with additional questions or concerns.   Lucille Passy, MS, [facility] Genetic Counselor [email or web address] (P) [phone]

## 2022-10-06 ENCOUNTER — Other Ambulatory Visit: Payer: Self-pay

## 2022-10-06 ENCOUNTER — Encounter (HOSPITAL_BASED_OUTPATIENT_CLINIC_OR_DEPARTMENT_OTHER): Payer: Self-pay | Admitting: General Surgery

## 2022-10-08 ENCOUNTER — Other Ambulatory Visit: Payer: Self-pay

## 2022-10-08 MED ORDER — OYSTER SHELL 500 MG PO TABS: 1.0000 | ORAL_TABLET | Freq: Two times a day (BID) | ORAL | 0 refills | Status: DC

## 2022-10-08 NOTE — Telephone Encounter (Signed)
Received return call from [place]. She is needing discontinue orders for nasal spray and flovent inhaler.   These orders need to be faxed to [phone].  This can be written as a letter and then placed in fax pile in front office.   Talbot Grumbling, RN

## 2022-10-09 ENCOUNTER — Ambulatory Visit
Admission: RE | Admit: 2022-10-09 | Discharge: 2022-10-09 | Disposition: A | Discharge status: Home / Self Care | Payer: Medicare Other | Source: Ambulatory Visit | Attending: General Surgery | Admitting: General Surgery

## 2022-10-09 DIAGNOSIS — Z170 Estrogen receptor positive status [ER+]: Secondary | ICD-10-CM

## 2022-10-09 HISTORY — PX: BREAST BIOPSY: SHX20

## 2022-10-13 ENCOUNTER — Ambulatory Visit (HOSPITAL_BASED_OUTPATIENT_CLINIC_OR_DEPARTMENT_OTHER): Payer: Medicare Other | Admitting: Certified Registered"

## 2022-10-13 ENCOUNTER — Ambulatory Visit
Admission: RE | Admit: 2022-10-13 | Discharge: 2022-10-13 | Disposition: A | Discharge status: Home / Self Care | Payer: Medicare Other | Source: Ambulatory Visit | Attending: General Surgery | Admitting: General Surgery

## 2022-10-13 ENCOUNTER — Encounter (HOSPITAL_BASED_OUTPATIENT_CLINIC_OR_DEPARTMENT_OTHER)
Admission: RE | Disposition: A | Discharge status: Home / Self Care | Payer: Self-pay | Source: Home / Self Care | Attending: General Surgery

## 2022-10-13 ENCOUNTER — Ambulatory Visit (HOSPITAL_BASED_OUTPATIENT_CLINIC_OR_DEPARTMENT_OTHER)
Admission: RE | Admit: 2022-10-13 | Discharge: 2022-10-13 | Disposition: A | Discharge status: Home / Self Care | Payer: Medicare Other | Attending: General Surgery | Admitting: General Surgery

## 2022-10-13 ENCOUNTER — Encounter (HOSPITAL_BASED_OUTPATIENT_CLINIC_OR_DEPARTMENT_OTHER): Payer: Self-pay | Admitting: General Surgery

## 2022-10-13 ENCOUNTER — Other Ambulatory Visit: Payer: Self-pay

## 2022-10-13 DIAGNOSIS — F17210 Nicotine dependence, cigarettes, uncomplicated: Secondary | ICD-10-CM | POA: Diagnosis not present

## 2022-10-13 DIAGNOSIS — F32A Depression, unspecified: Secondary | ICD-10-CM | POA: Diagnosis not present

## 2022-10-13 DIAGNOSIS — E785 Hyperlipidemia, unspecified: Secondary | ICD-10-CM | POA: Insufficient documentation

## 2022-10-13 DIAGNOSIS — Z170 Estrogen receptor positive status [ER+]: Secondary | ICD-10-CM | POA: Diagnosis not present

## 2022-10-13 DIAGNOSIS — F418 Other specified anxiety disorders: Secondary | ICD-10-CM | POA: Diagnosis not present

## 2022-10-13 DIAGNOSIS — J449 Chronic obstructive pulmonary disease, unspecified: Secondary | ICD-10-CM

## 2022-10-13 DIAGNOSIS — Z01818 Encounter for other preprocedural examination: Secondary | ICD-10-CM

## 2022-10-13 DIAGNOSIS — K219 Gastro-esophageal reflux disease without esophagitis: Secondary | ICD-10-CM | POA: Diagnosis not present

## 2022-10-13 DIAGNOSIS — F419 Anxiety disorder, unspecified: Secondary | ICD-10-CM | POA: Diagnosis not present

## 2022-10-13 DIAGNOSIS — C50911 Malignant neoplasm of unspecified site of right female breast: Secondary | ICD-10-CM

## 2022-10-13 DIAGNOSIS — F17200 Nicotine dependence, unspecified, uncomplicated: Secondary | ICD-10-CM | POA: Diagnosis not present

## 2022-10-13 DIAGNOSIS — C50211 Malignant neoplasm of upper-inner quadrant of right female breast: Secondary | ICD-10-CM

## 2022-10-13 DIAGNOSIS — Z79899 Other long term (current) drug therapy: Secondary | ICD-10-CM | POA: Diagnosis not present

## 2022-10-13 DIAGNOSIS — D0511 Intraductal carcinoma in situ of right breast: Secondary | ICD-10-CM | POA: Diagnosis present

## 2022-10-13 HISTORY — DX: Prediabetes: R73.03

## 2022-10-13 HISTORY — PX: BREAST LUMPECTOMY WITH RADIOACTIVE SEED LOCALIZATION: SHX6424

## 2022-10-13 SURGERY — BREAST LUMPECTOMY WITH RADIOACTIVE SEED LOCALIZATION
Anesthesia: General | Site: Breast | Laterality: Right

## 2022-10-13 MED ORDER — ACETAMINOPHEN 500 MG PO TABS: 1000.0000 mg | ORAL_TABLET | Freq: Once | ORAL | Status: DC

## 2022-10-13 MED ORDER — DEXAMETHASONE SODIUM PHOSPHATE 10 MG/ML IJ SOLN
INTRAMUSCULAR | Status: DC | PRN
  Administered 2022-10-13: 8 mg via INTRAVENOUS

## 2022-10-13 MED ORDER — FENTANYL CITRATE (PF) 100 MCG/2ML IJ SOLN
INTRAMUSCULAR | Status: AC
  Filled 2022-10-13: qty 2

## 2022-10-13 MED ORDER — PROPOFOL 10 MG/ML IV BOLUS
INTRAVENOUS | Status: DC | PRN
  Administered 2022-10-13: 150 mg via INTRAVENOUS
  Administered 2022-10-13: 50 mg via INTRAVENOUS

## 2022-10-13 MED ORDER — GABAPENTIN 100 MG PO CAPS
ORAL_CAPSULE | ORAL | Status: AC
  Filled 2022-10-13: qty 1

## 2022-10-13 MED ORDER — MIDAZOLAM HCL 2 MG/2ML IJ SOLN
INTRAMUSCULAR | Status: AC
  Filled 2022-10-13: qty 2

## 2022-10-13 MED ORDER — ACETAMINOPHEN 500 MG PO TABS
ORAL_TABLET | ORAL | Status: AC
  Filled 2022-10-13: qty 2

## 2022-10-13 MED ORDER — DEXAMETHASONE SODIUM PHOSPHATE 10 MG/ML IJ SOLN
INTRAMUSCULAR | Status: AC
  Filled 2022-10-13: qty 1

## 2022-10-13 MED ORDER — EPHEDRINE 5 MG/ML INJ
INTRAVENOUS | Status: AC
  Filled 2022-10-13: qty 5

## 2022-10-13 MED ORDER — PHENYLEPHRINE 80 MCG/ML (10ML) SYRINGE FOR IV PUSH (FOR BLOOD PRESSURE SUPPORT)
PREFILLED_SYRINGE | INTRAVENOUS | Status: AC
  Filled 2022-10-13: qty 10

## 2022-10-13 MED ORDER — PROPOFOL 10 MG/ML IV BOLUS
INTRAVENOUS | Status: AC
  Filled 2022-10-13: qty 20

## 2022-10-13 MED ORDER — ONDANSETRON HCL 4 MG/2ML IJ SOLN
INTRAMUSCULAR | Status: AC
  Filled 2022-10-13: qty 2

## 2022-10-13 MED ORDER — EPHEDRINE SULFATE (PRESSORS) 50 MG/ML IJ SOLN
INTRAMUSCULAR | Status: DC | PRN
  Administered 2022-10-13: 10 mg via INTRAVENOUS

## 2022-10-13 MED ORDER — ALBUTEROL SULFATE HFA 108 (90 BASE) MCG/ACT IN AERS
2.0000 | INHALATION_SPRAY | Freq: Once | RESPIRATORY_TRACT | Status: AC
  Administered 2022-10-13: 2 via RESPIRATORY_TRACT

## 2022-10-13 MED ORDER — GABAPENTIN 300 MG PO CAPS
ORAL_CAPSULE | ORAL | Status: AC
  Filled 2022-10-13: qty 1

## 2022-10-13 MED ORDER — VANCOMYCIN HCL IN DEXTROSE 1-5 GM/200ML-% IV SOLN
1000.0000 mg | INTRAVENOUS | Status: AC
  Administered 2022-10-13: 1000 mg via INTRAVENOUS

## 2022-10-13 MED ORDER — GABAPENTIN 100 MG PO CAPS
100.0000 mg | ORAL_CAPSULE | ORAL | Status: AC
  Administered 2022-10-13: 100 mg via ORAL

## 2022-10-13 MED ORDER — LIDOCAINE 2% (20 MG/ML) 5 ML SYRINGE
INTRAMUSCULAR | Status: AC
  Filled 2022-10-13: qty 5

## 2022-10-13 MED ORDER — CHLORHEXIDINE GLUCONATE CLOTH 2 % EX PADS: 6.0000 | MEDICATED_PAD | Freq: Once | CUTANEOUS | Status: DC

## 2022-10-13 MED ORDER — PHENYLEPHRINE HCL (PRESSORS) 10 MG/ML IV SOLN
INTRAVENOUS | Status: DC | PRN
  Administered 2022-10-13: 80 ug via INTRAVENOUS
  Administered 2022-10-13 (×3): 160 ug via INTRAVENOUS

## 2022-10-13 MED ORDER — MIDAZOLAM HCL 5 MG/5ML IJ SOLN
INTRAMUSCULAR | Status: DC | PRN
  Administered 2022-10-13: 2 mg via INTRAVENOUS

## 2022-10-13 MED ORDER — ONDANSETRON HCL 4 MG/2ML IJ SOLN
INTRAMUSCULAR | Status: DC | PRN
  Administered 2022-10-13: 4 mg via INTRAVENOUS

## 2022-10-13 MED ORDER — FENTANYL CITRATE (PF) 100 MCG/2ML IJ SOLN
INTRAMUSCULAR | Status: DC | PRN
  Administered 2022-10-13: 25 ug via INTRAVENOUS

## 2022-10-13 MED ORDER — VANCOMYCIN HCL IN DEXTROSE 1-5 GM/200ML-% IV SOLN
INTRAVENOUS | Status: AC
  Filled 2022-10-13: qty 200

## 2022-10-13 MED ORDER — LACTATED RINGERS IV SOLN: INTRAVENOUS | Status: DC

## 2022-10-13 MED ORDER — ALBUTEROL SULFATE HFA 108 (90 BASE) MCG/ACT IN AERS
INHALATION_SPRAY | RESPIRATORY_TRACT | Status: AC
  Filled 2022-10-13: qty 6.7

## 2022-10-13 MED ORDER — LIDOCAINE HCL (CARDIAC) PF 100 MG/5ML IV SOSY
PREFILLED_SYRINGE | INTRAVENOUS | Status: DC | PRN
  Administered 2022-10-13: 20 mg via INTRAVENOUS

## 2022-10-13 MED ORDER — BUPIVACAINE-EPINEPHRINE (PF) 0.25% -1:200000 IJ SOLN
INTRAMUSCULAR | Status: DC | PRN
  Administered 2022-10-13: 20 mL

## 2022-10-13 MED ORDER — ACETAMINOPHEN 500 MG PO TABS
1000.0000 mg | ORAL_TABLET | ORAL | Status: AC
  Administered 2022-10-13: 1000 mg via ORAL

## 2022-10-13 MED ORDER — OXYCODONE HCL 5 MG PO TABS
5.0000 mg | ORAL_TABLET | Freq: Four times a day (QID) | ORAL | 0 refills | Status: DC | PRN
Start: 2022-10-13 — End: 2023-02-24

## 2022-10-13 SURGICAL SUPPLY — 39 items
ADH SKN CLS APL DERMABOND .7 (GAUZE/BANDAGES/DRESSINGS) ×1
APL PRP STRL LF DISP 70% ISPRP (MISCELLANEOUS) ×1
APPLIER CLIP 9.375 MED OPEN (MISCELLANEOUS) ×1
APR CLP MED 9.3 20 MLT OPN (MISCELLANEOUS) ×1
BLADE SURG 15 STRL LF DISP TIS (BLADE) ×1 IMPLANT
BLADE SURG 15 STRL SS (BLADE) ×1
CANISTER SUC SOCK COL 7IN (MISCELLANEOUS) ×1 IMPLANT
CANISTER SUCT 1200ML W/VALVE (MISCELLANEOUS) ×1 IMPLANT
CHLORAPREP W/TINT 26 (MISCELLANEOUS) ×1 IMPLANT
CLIP APPLIE 9.375 MED OPEN (MISCELLANEOUS) IMPLANT
COVER BACK TABLE 60X90IN (DRAPES) ×1 IMPLANT
COVER MAYO STAND STRL (DRAPES) ×1 IMPLANT
COVER PROBE CYLINDRICAL 5X96 (MISCELLANEOUS) ×1 IMPLANT
DERMABOND ADVANCED .7 DNX12 (GAUZE/BANDAGES/DRESSINGS) ×1 IMPLANT
DRAPE LAPAROSCOPIC ABDOMINAL (DRAPES) ×1 IMPLANT
DRAPE UTILITY XL STRL (DRAPES) ×1 IMPLANT
ELECT COATED BLADE 2.86 ST (ELECTRODE) ×1 IMPLANT
ELECT REM PT RETURN 9FT ADLT (ELECTROSURGICAL) ×1
ELECTRODE REM PT RTRN 9FT ADLT (ELECTROSURGICAL) ×1 IMPLANT
GLOVE BIO SURGEON STRL SZ7.5 (GLOVE) ×2 IMPLANT
GOWN STRL REUS W/ TWL LRG LVL3 (GOWN DISPOSABLE) ×2 IMPLANT
GOWN STRL REUS W/TWL LRG LVL3 (GOWN DISPOSABLE) ×2
KIT MARKER MARGIN INK (KITS) ×1 IMPLANT
NDL HYPO 25X1 1.5 SAFETY (NEEDLE) IMPLANT
NEEDLE HYPO 25X1 1.5 SAFETY (NEEDLE) ×1 IMPLANT
NS IRRIG 1000ML POUR BTL (IV SOLUTION) IMPLANT
PACK BASIN DAY SURGERY FS (CUSTOM PROCEDURE TRAY) ×1 IMPLANT
PENCIL SMOKE EVACUATOR (MISCELLANEOUS) ×1 IMPLANT
SLEEVE SCD COMPRESS KNEE MED (STOCKING) ×1 IMPLANT
SPIKE FLUID TRANSFER (MISCELLANEOUS) IMPLANT
SPONGE T-LAP 18X18 ~~LOC~~+RFID (SPONGE) ×1 IMPLANT
SUT MON AB 4-0 PC3 18 (SUTURE) ×1 IMPLANT
SUT SILK 2 0 SH (SUTURE) IMPLANT
SUT VICRYL 3-0 CR8 SH (SUTURE) ×1 IMPLANT
SYR CONTROL 10ML LL (SYRINGE) IMPLANT
TOWEL GREEN STERILE FF (TOWEL DISPOSABLE) ×1 IMPLANT
TRAY FAXITRON CT DISP (TRAY / TRAY PROCEDURE) ×1 IMPLANT
TUBE CONNECTING 20X1/4 (TUBING) ×1 IMPLANT
YANKAUER SUCT BULB TIP NO VENT (SUCTIONS) IMPLANT

## 2022-10-13 NOTE — Anesthesia Postprocedure Evaluation (Signed)
Anesthesia Post Note  Patient: Jean Williams  Procedure(s) Performed: RIGHT BREAST LUMPECTOMY WITH RADIOACTIVE SEED LOCALIZATION (Right: Breast)     Patient location during evaluation: PACU Anesthesia Type: General Level of consciousness: awake and alert, patient cooperative and oriented Pain management: pain level controlled Vital Signs Assessment: post-procedure vital signs reviewed and stable Respiratory status: spontaneous breathing, nonlabored ventilation and respiratory function stable Cardiovascular status: blood pressure returned to baseline and stable Postop Assessment: no apparent nausea or vomiting, able to ambulate and adequate PO intake Anesthetic complications: no   No notable events documented.  Last Vitals:  Vitals:   10/13/22 1430 10/13/22 1445  BP: 124/73 (!) 140/95  Pulse: 94 97  Resp: 16 16  Temp:  36.4 C  SpO2: 92% 95%    Last Pain:  Vitals:   10/13/22 1445  TempSrc:   PainSc: 0-No pain                 Khyron Garno,E. Donneisha Beane

## 2022-10-13 NOTE — Interval H&P Note (Signed)
History and Physical Interval Note:  10/13/2022 12:27 PM  Jean Williams  has presented today for surgery, with the diagnosis of RIGHT BREAST CANCER.  The various methods of treatment have been discussed with the patient and family. After consideration of risks, benefits and other options for treatment, the patient has consented to  Procedure(s): RIGHT BREAST LUMPECTOMY WITH RADIOACTIVE SEED LOCALIZATION (Right) as a surgical intervention.  The patient's history has been reviewed, patient examined, no change in status, stable for surgery.  I have reviewed the patient's chart and labs.  Questions were answered to the patient's satisfaction.     [name]

## 2022-10-13 NOTE — Anesthesia Preprocedure Evaluation (Addendum)
Anesthesia Evaluation  Patient identified by MRN, date of birth, ID band Patient awake    Reviewed: Allergy & Precautions, NPO status , Patient's Chart, lab work & pertinent test results  History of Anesthesia Complications Negative for: history of anesthetic complications  Airway Mallampati: III  TM Distance: >3 FB Neck ROM: Full    Dental  (+) Missing, Poor Dentition, Dental Advisory Given   Pulmonary COPD,  COPD inhaler, Current SmokerPatient did not abstain from smoking.  Will treat with albuterol pre op   + wheezing      Cardiovascular negative cardio ROS  Rhythm:Regular Rate:Normal  '21 ECHO: EF 55 to 60%. The LV has normal function, no regional wall motion abnormalities. There is mild asymmetric LVH of the basal and septal segments. Left ventricular diastolic parameters are indeterminate. RVF is normal, trivial MR    Neuro/Psych   Anxiety Depression  Schizophrenia  negative neurological ROS     GI/Hepatic Neg liver ROS,GERD  Medicated and Controlled,,  Endo/Other  negative endocrine ROS    Renal/GU negative Renal ROS     Musculoskeletal   Abdominal   Peds  Hematology negative hematology ROS (+)   Anesthesia Other Findings   Reproductive/Obstetrics                             Anesthesia Physical Anesthesia Plan  ASA: 3  Anesthesia Plan: General   Post-op Pain Management: Tylenol PO (pre-op)*   Induction: Intravenous  PONV Risk Score and Plan: 2 and Ondansetron and Dexamethasone  Airway Management Planned: LMA  Additional Equipment: None  Intra-op Plan:   Post-operative Plan:   Informed Consent: I have reviewed the patients History and Physical, chart, labs and discussed the procedure including the risks, benefits and alternatives for the proposed anesthesia with the patient or authorized representative who has indicated his/her understanding and acceptance.      Dental advisory given  Plan Discussed with: CRNA and Surgeon  Anesthesia Plan Comments:         Anesthesia Quick Evaluation

## 2022-10-13 NOTE — Op Note (Signed)
10/13/2022  1:34 PM  PATIENT:  Jean Williams  62 y.o. female  PRE-OPERATIVE DIAGNOSIS:  RIGHT BREAST CANCER  POST-OPERATIVE DIAGNOSIS:  RIGHT BREAST CANCER  PROCEDURE:  Procedure(s): RIGHT BREAST LUMPECTOMY WITH RADIOACTIVE SEED LOCALIZATION (Right)  SURGEON:  Surgeon(s) and Role:    * Griselda Miner, MD - Primary  PHYSICIAN ASSISTANT:   ASSISTANTS: none   ANESTHESIA:   local and general  EBL:  15 mL   BLOOD ADMINISTERED:none  DRAINS: none   LOCAL MEDICATIONS USED:  MARCAINE     SPECIMEN:  Source of Specimen:  right breast tissue with additional lateral margin  DISPOSITION OF SPECIMEN:  PATHOLOGY  COUNTS:  YES  TOURNIQUET:  * No tourniquets in log *  DICTATION: .Dragon Dictation  After informed consent was obtained the patient was brought to the operating room and placed in the supine position on the operating table.  After adequate induction of general anesthesia the patient's right breast was prepped with ChloraPrep, allowed to dry, and draped in usual sterile manner.  An appropriate timeout was performed.  Previously an I-125 seed was placed in the inner aspect of the right breast to mark an area of invasive breast cancer.  The neoprobe was set to I-125 in the area of radioactivity was readily identified.  The area around this was infiltrated with quarter percent Marcaine.  A curvilinear incision was then made along the inner aspect of the areola of the right breast with a 15 blade knife.  The incision was carried through the skin and subcutaneous tissue sharply with the electrocautery.  Dissection was then carried out medially between the breast tissue in the subcutaneous fat and skin.  Once this dissection was beyond the area of the cancer I then removed a circular portion of breast tissue sharply with the electrocautery around the radioactive seed while checking the area of radioactivity frequently.  Once the specimen was removed it was oriented with the appropriate  paint colors.  A specimen radiograph was obtained that showed the clip and seed to be near the center of the specimen.  I did elect to take an additional lateral margin and this was marked appropriately.  All of the tissue was then sent to pathology for further evaluation.  Hemostasis was achieved using the Bovie electrocautery.  The wound was irrigated with saline and infiltrated with more quarter percent Marcaine.  The deep layer of the incision was then closed with interrupted 3-0 Vicryl stitches.  The skin was then closed with interrupted 4-0 Monocryl subcuticular stitches.  Dermabond dressings were applied.  The patient tolerated the procedure well.  At the end of the case all needle sponge and instrument counts were correct.  The patient was then awakened and taken to recovery in stable condition.  PLAN OF CARE: Discharge to home after PACU  PATIENT DISPOSITION:  PACU - hemodynamically stable.   Delay start of Pharmacological VTE agent (>24hrs) due to surgical blood loss or risk of bleeding: not applicable

## 2022-10-13 NOTE — Discharge Instructions (Signed)
No tylenol until 5:20 p.m.  Post Anesthesia Home Care Instructions  Activity: Get plenty of rest for the remainder of the day. A responsible individual must stay with you for 24 hours following the procedure.  For the next 24 hours, DO NOT: -Drive a car -Advertising copywriter -Drink alcoholic beverages -Take any medication unless instructed by your physician -Make any legal decisions or sign important papers.  Meals: Start with liquid foods such as gelatin or soup. Progress to regular foods as tolerated. Avoid greasy, spicy, heavy foods. If nausea and/or vomiting occur, drink only clear liquids until the nausea and/or vomiting subsides. Call your physician if vomiting continues.  Special Instructions/Symptoms: Your throat may feel dry or sore from the anesthesia or the breathing tube placed in your throat during surgery. If this causes discomfort, gargle with warm salt water. The discomfort should disappear within 24 hours.  If you had a scopolamine patch placed behind your ear for the management of post- operative nausea and/or vomiting:  1. The medication in the patch is effective for 72 hours, after which it should be removed.  Wrap patch in a tissue and discard in the trash. Wash hands thoroughly with soap and water. 2. You may remove the patch earlier than 72 hours if you experience unpleasant side effects which may include dry mouth, dizziness or visual disturbances. 3. Avoid touching the patch. Wash your hands with soap and water after contact with the patch.

## 2022-10-13 NOTE — Transfer of Care (Signed)
Immediate Anesthesia Transfer of Care Note  Patient: Jean Williams  Procedure(s) Performed: RIGHT BREAST LUMPECTOMY WITH RADIOACTIVE SEED LOCALIZATION (Right: Breast)  Patient Location: PACU  Anesthesia Type:General  Level of Consciousness: drowsy  Airway & Oxygen Therapy: Patient Spontanous Breathing and Patient connected to face mask oxygen  Post-op Assessment: Report given to RN and Post -op Vital signs reviewed and stable  Post vital signs: Reviewed and stable  Last Vitals:  Vitals Value Taken Time  BP 131/83 10/13/22 1345  Temp    Pulse 95 10/13/22 1347  Resp 16 10/13/22 1347  SpO2 99 % 10/13/22 1347  Vitals shown include unvalidated device data.  Last Pain:  Vitals:   10/13/22 1118  TempSrc: Oral  PainSc: 0-No pain      Patients Stated Pain Goal: 5 (10/13/22 1118)  Complications: No notable events documented.

## 2022-10-13 NOTE — H&P (Signed)
REFERRING PHYSICIAN: Malachy MoodFeng, Yan, MD PROVIDER: Lindell NoePAUL STEPHEN TOTH, MD MRN: [id number] DOB: [birth year] Subjective   Chief Complaint: Breast Cancer  History of Present Illness: Jean Williams is a 62 y.o. female who is seen today as an office consultation for evaluation of Breast Cancer  We are asked to see the patient in consultation by Dr. Mosetta PuttFeng to evaluate her for a new right breast cancer. The patient is a 62 year old white female who recently went for a routine screening mammogram. At that time she was found to have a 5 mm mass in the inner aspect of the right breast. The lymph nodes looked normal by ultrasound. 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 She does have fairly strong family history of breast cancer. She has schizophrenia and lives in a group home.  Review of Systems: A complete review of systems was obtained from the patient. I have reviewed this information and discussed as appropriate with the patient. See HPI as well for other ROS.  ROS   Medical History: Past Medical History:  Diagnosis Date  GERD (gastroesophageal reflux disease)  Hyperlipidemia   Patient Active Problem List  Diagnosis  Eczema, unspecified  Lung nodule  Malignant neoplasm of upper-inner quadrant of right breast in female, estrogen receptor positive  Mixed hyperlipidemia  Nonalcoholic fatty liver disease  Orthostatic hypotension  Panic attacks  Rosacea  Schizophrenia, unspecified type (CMS-HCC)  Seasonal allergic rhinitis  Tobacco dependence  Major depressive disorder, recurrent, unspecified (CMS-HCC)   Past Surgical History:  Procedure Laterality Date  E/O rectal mass N/A 03/2019    Allergies  Allergen Reactions  Penicillins Hives  REACTION: unspecified/ whelts  Has patient had a PCN reaction causing immediate rash, facial/tongue/throat swelling, SOB or lightheadedness with hypotension:  Yes Has patient had a PCN reaction causing severe rash involving mucus membranes or skin necrosis: No Has patient had a PCN reaction that required hospitalization: No Has patient had a PCN reaction occurring within the last 10 years: no If all of the above answers are "NO", then may proceed with Cephalosporin use.   Current Outpatient Medications on File Prior to Visit  Medication Sig Dispense Refill  acetaminophen (TYLENOL) 325 MG tablet Take 650 mg by mouth every 6 (six) hours as needed  albuterol MDI, PROVENTIL, VENTOLIN, PROAIR, HFA 90 mcg/actuation inhaler Inhale 1-2 inhalations into the lungs every 6 (six) hours as needed for Wheezing or Shortness of Breath  atorvastatin (LIPITOR) 40 MG tablet Take 40 mg by mouth at bedtime  betamethasone valerate (VALISONE) 0.1 % ointment Apply 1 Application topically 2 (two) times daily  docusate (COLACE) 100 MG capsule Take 100 mg by mouth 2 (two) times daily  famotidine (PEPCID) 20 MG tablet Take 20 mg by mouth 2 (two) times daily  polyethylene glycol (MIRALAX) powder Take 17 g by mouth 2 (two) times daily as needed for Constipation  triamcinolone 0.1 % ointment Apply 1 Application topically 2 (two) times daily   No current facility-administered medications on file prior to visit.   Family History  Problem Relation Age of Onset  Breast cancer Sister    Social History   Tobacco Use  Smoking Status Every Day  Packs/day: 1  Types: Cigarettes  Smokeless Tobacco Not on file    Social History   Socioeconomic History  Marital status: Single  Tobacco Use  Smoking status: Every Day  Packs/day: 1  Types:  Cigarettes  Vaping Use  Vaping Use: Unknown  Substance and Sexual Activity  Alcohol use: Defer  Drug use: Defer   Objective:  There were no vitals filed for this visit.  There is no height or weight on file to calculate BMI.  Physical Exam Vitals reviewed.  Constitutional:  General: She is not in acute distress. Appearance:  Normal appearance.  HENT:  Head: Normocephalic and atraumatic.  Right Ear: External ear normal.  Left Ear: External ear normal.  Nose: Nose normal.  Mouth/Throat:  Mouth: Mucous membranes are moist.  Pharynx: Oropharynx is clear.  Eyes:  General: No scleral icterus. Extraocular Movements: Extraocular movements intact.  Conjunctiva/sclera: Conjunctivae normal.  Pupils: Pupils are equal, round, and reactive to light.  Cardiovascular:  Rate and Rhythm: Normal rate and regular rhythm.  Pulses: Normal pulses.  Heart sounds: Normal heart sounds.  Pulmonary:  Effort: Pulmonary effort is normal. No respiratory distress.  Breath sounds: Normal breath sounds.  Abdominal:  General: Bowel sounds are normal.  Palpations: Abdomen is soft.  Tenderness: There is no abdominal tenderness.  Musculoskeletal:  General: No swelling, tenderness or deformity. Normal range of motion.  Cervical back: Normal range of motion and neck supple.  Skin: General: Skin is warm and dry.  Coloration: Skin is not jaundiced.  Neurological:  General: No focal deficit present.  Mental Status: She is alert and oriented to person, place, and time.  Psychiatric:  Mood and Affect: Mood normal.  Behavior: Behavior normal.     Breast: There is a palpable bruise in the inner aspect of the right breast. Other than this there is no palpable mass in either breast. There is no palpable axillary, supraclavicular, or cervical lymphadenopathy.  Labs, Imaging and Diagnostic Testing:  Assessment and Plan:   Diagnoses and all orders for this visit:  Malignant neoplasm of upper-inner quadrant of right breast in female, estrogen receptor positive  - [facility] Case Posting Request; Future   The patient appears to have a small low-grade favorable cancer in the inner aspect of the right breast. I have discussed with her in detail the different options for treatment and at this point she favors breast conservation which I feel is  very reasonable. She will not need a node evaluation given the small size and favorable nature of the cancer. I have discussed with her in detail the risks and benefits of the operation as well as some of the technical aspects including the use of a radioactive seed for localization and she understands and wishes to proceed. She will meet with medical and radiation oncology to discuss adjuvant therapy. We will begin surgical planning.

## 2022-10-13 NOTE — Anesthesia Procedure Notes (Signed)
Procedure Name: LMA Insertion Date/Time: 10/13/2022 12:53 PM  Performed by: Lauralyn Primes, CRNAPre-anesthesia Checklist: Patient identified, Emergency Drugs available, Suction available and Patient being monitored Patient Re-evaluated:Patient Re-evaluated prior to induction Oxygen Delivery Method: Circle system utilized Preoxygenation: Pre-oxygenation with 100% oxygen Induction Type: IV induction Ventilation: Mask ventilation without difficulty LMA: LMA inserted LMA Size: 4.0 Number of attempts: 1 Airway Equipment and Method: Bite block Placement Confirmation: positive ETCO2 Tube secured with: Tape Dental Injury: Teeth and Oropharynx as per pre-operative assessment

## 2022-10-14 ENCOUNTER — Other Ambulatory Visit: Payer: Self-pay

## 2022-10-14 ENCOUNTER — Encounter (HOSPITAL_BASED_OUTPATIENT_CLINIC_OR_DEPARTMENT_OTHER): Payer: Self-pay | Admitting: General Surgery

## 2022-10-14 DIAGNOSIS — F200 Paranoid schizophrenia: Secondary | ICD-10-CM

## 2022-10-14 DIAGNOSIS — K921 Melena: Secondary | ICD-10-CM

## 2022-10-14 MED ORDER — ALBUTEROL SULFATE HFA 108 (90 BASE) MCG/ACT IN AERS: 1.0000 | INHALATION_SPRAY | Freq: Four times a day (QID) | RESPIRATORY_TRACT | 2 refills | Status: DC | PRN

## 2022-10-14 MED ORDER — DOCUSATE SODIUM 100 MG PO CAPS: ORAL_CAPSULE | ORAL | 0 refills | Status: DC

## 2022-10-14 MED ORDER — LORATADINE 10 MG PO TABS: 10.0000 mg | ORAL_TABLET | Freq: Every day | ORAL | 11 refills | Status: DC

## 2022-10-14 MED ORDER — FAMOTIDINE 20 MG PO TABS: 20.0000 mg | ORAL_TABLET | Freq: Two times a day (BID) | ORAL | 0 refills | Status: DC

## 2022-10-14 MED ORDER — POLYETHYLENE GLYCOL 3350 17 GM/SCOOP PO POWD
17.0000 g | Freq: Two times a day (BID) | ORAL | 1 refills | Status: DC | PRN
Start: 2022-10-14 — End: 2023-02-24

## 2022-10-14 MED ORDER — TRIAMCINOLONE ACETONIDE 0.1 % EX OINT: 1.0000 | TOPICAL_OINTMENT | Freq: Two times a day (BID) | CUTANEOUS | 1 refills | Status: DC

## 2022-10-14 MED ORDER — CERTAVITE/ANTIOXIDANTS PO TABS: ORAL_TABLET | ORAL | 0 refills | Status: DC

## 2022-10-15 LAB — SURGICAL PATHOLOGY

## 2022-10-20 ENCOUNTER — Encounter: Payer: Self-pay | Admitting: *Deleted

## 2022-10-20 ENCOUNTER — Telehealth: Payer: Self-pay | Admitting: *Deleted

## 2022-10-20 NOTE — Telephone Encounter (Signed)
Ordered oncotype testing on surgical path. Requisition sent to [facility]

## 2022-10-27 ENCOUNTER — Encounter: Payer: Self-pay | Admitting: *Deleted

## 2022-10-27 ENCOUNTER — Other Ambulatory Visit: Payer: Self-pay | Admitting: Student

## 2022-10-27 ENCOUNTER — Telehealth: Payer: Self-pay | Admitting: *Deleted

## 2022-10-27 NOTE — Telephone Encounter (Signed)
Pt legal guardian Jean Williams called to discuss some concerns for the remainder of pts cancer treatment. Informed with change to schedule as well as inability to continue to work will cause pt schizophrenia and mental health to become "off kilter"  Also addressed concerns regarding drug interactions with anti-estrogen oral therapy as well as the effects of xrt on her newly dx emphysema Encourage [name] to attend f/u appt with pt to see Dr. Roselind Messier on 5/6. Provided date and time.  Msg sent to physician team regarding Mark's concerns.

## 2022-10-29 ENCOUNTER — Telehealth: Payer: Self-pay | Admitting: Hematology

## 2022-10-29 ENCOUNTER — Telehealth: Payer: Self-pay | Admitting: *Deleted

## 2022-10-29 NOTE — Telephone Encounter (Signed)
Spoke to pt brother and legal guardian [name]. Per pt, is "too anxious about radiation and doesn't want to do it" Pt would like to be watched closely.  Informed physician team. Request appt for Dr. Mosetta Putt and for Dr. Roselind Messier appt to be cx at this time.

## 2022-10-29 NOTE — Telephone Encounter (Signed)
Contacted patient to scheduled appointments. Patient is aware of appointments that are scheduled.   

## 2022-11-03 ENCOUNTER — Telehealth: Payer: Self-pay | Admitting: *Deleted

## 2022-11-03 NOTE — Telephone Encounter (Signed)
Received message from brother Jean Williams who states she just wanted to confirm appt for 5/6 and make sure xrt appts were cx since they have decided not to proceed with xrt. Returned [employer] call but had to leave a VM confirming appt for 5/8. Msg sent to rad onc to cx appts.

## 2022-11-09 NOTE — Progress Notes (Incomplete)
Patient Care Team: Levin Erp, MD as PCP - General (Family Medicine) Malachy Mood, MD as Consulting Physician (Hematology) Griselda Miner, MD as Consulting Physician (General Surgery) Antony Blackbird, MD as Consulting Physician (Radiation Oncology) Pershing Proud, RN as Oncology Nurse Navigator Donnelly Angelica, RN as Registered Nurse   CHIEF COMPLAINT: Follow up right breast cancer, discuss surgical path and adjuvant therapy  Oncology History Overview Note   Cancer Staging  Malignant neoplasm of upper-inner quadrant of right breast in female, estrogen receptor positive (HCC) Staging form: Breast, AJCC 8th Edition - Clinical stage from 09/09/2022: Stage IA (cT1a, cN0, cM0, G1, ER+, PR+, HER2-) - Signed by Malachy Mood, MD on 09/16/2022 Stage prefix: Initial diagnosis Histologic grading system: 3 grade system     Malignant neoplasm of upper-inner quadrant of right breast in female, estrogen receptor positive (HCC)  09/09/2022 Cancer Staging   Staging form: Breast, AJCC 8th Edition - Clinical stage from 09/09/2022: Stage IA (cT1a, cN0, cM0, G1, ER+, PR+, HER2-) - Signed by Malachy Mood, MD on 09/16/2022 Stage prefix: Initial diagnosis Histologic grading system: 3 grade system   09/10/2022 Pathology Results    Pathology revealed 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   09/16/2022 Initial Diagnosis   Malignant neoplasm of upper-inner quadrant of right breast in female, estrogen receptor positive (HCC)    Genetic Testing   [facility] Guidelines-Based Panel+RNA was Negative. Report date is 09/23/2022.  This panel offered by [facility] includes sequencing and/or deletion duplication testing of the following 19 genes: ATM, BARD1, BRCA1, BRCA2, BRIP1, CDH1, CHEK2, EPCAM (Deletion/duplication testing only), MLH1, MSH2, MSH6, NF1, PALB2, PMS2, PTEN, RAD51C, RAD51D, STK11, and TP53.    10/13/2022 Cancer Staging   Staging  form: Breast, AJCC 8th Edition - Pathologic stage from 10/13/2022: pT1c, ER+, PR+, HER2- - Signed by Pollyann Samples, NP on 11/10/2022 Nuclear grade: G1      CURRENT THERAPY: S/p lumpectomy, pending adjuvant radiation and/or anti-estrogen therapy   INTERVAL HISTORY Ms. Dace returns for follow up as scheduled. Last seen by Dr. Mosetta Putt as new consult 09/17/22 and underwent right lumpectomy 10/13/22. Path showed IDC 1.7 cm, grade 1 with DCIS, clear margins, no LVI. This was pT1c,Nx; LNs were not evaluated. She recovered from surgery well. Denies pain. She massages the right breast occasionally, it is a little red today but not tender or warm. Denies fever/chills. She has decided to forego radiation due to potential skin and respiratory side effects. She is a heavy smoker and not motivated to quit. Also thinks daily RT would interfere with her job which brings her satisfaction and helps with mental state. Brother [name] and caregiver Steward Drone are here.  ROS  All other systems reviewed and negative   Past Medical History:  Diagnosis Date   Chronic constipation    Eczema    Emphysema of lung (HCC)    in epic CT angio Chest-- 01-24-2018   GAD (generalized anxiety disorder)    GERD (gastroesophageal reflux disease)    History of adenomatous polyp of colon    History of basal cell carcinoma (BCC) excision    05-13-2011   s/p moh's left upper lip   History of panic attacks    Hyperlipidemia    Pre-diabetes    Psoriasis    Rectal polyp    Schizophrenia (HCC)    followed by [facility] , dr Pernell Dupre   Seasonal allergic rhinitis  Tachycardia    followed by pcp, first dx 2015, no meds for this,  last EKG in epic dated 02-09-2018     Past Surgical History:  Procedure Laterality Date   BREAST BIOPSY Right 09/09/2022   [place] RT BREAST BX W LOC DEV 1ST LESION IMG BX SPEC US GUIDE 09/09/2022 [facility]   BREAST BIOPSY  10/09/2022   MM RT RADIOACTIVE SEED LOC MAMMO GUIDE 10/09/2022 [facility]   BREAST  LUMPECTOMY WITH RADIOACTIVE SEED LOCALIZATION Right 10/13/2022   Procedure: RIGHT BREAST LUMPECTOMY WITH RADIOACTIVE SEED LOCALIZATION;  Surgeon: Griselda Miner, MD;  Location: [facility];  Service: General;  Laterality: Right;   COLONOSCOPY     TRANSANAL EXCISION OF RECTAL MASS N/A 03/17/2019   Procedure: TRANSANAL EXCISION OF RECTAL POLYP;  Surgeon: Romie Levee, MD;  Location: [facility];  Service: General;  Laterality: N/A;     Outpatient Encounter Medications as of 11/10/2022  Medication Sig Note   acetaminophen (TYLENOL) 325 MG tablet Take 2 tablets (650 mg total) by mouth every 6 (six) hours as needed.    albuterol (VENTOLIN HFA) 108 (90 Base) MCG/ACT inhaler Inhale 1-2 puffs into the lungs every 6 (six) hours as needed for wheezing or shortness of breath.    anastrozole (ARIMIDEX) 1 MG tablet Take 1 tablet (1 mg total) by mouth daily.    atorvastatin (LIPITOR) 40 MG tablet Take 1 tablet (40 mg total) by mouth at bedtime.    betamethasone valerate ointment (VALISONE) 0.1 % Apply 1 Application topically 2 (two) times daily.    calcium-vitamin D (OSCAL WITH D) 500-5 MG-MCG tablet Take 1 tablet by mouth daily with breakfast.    carboxymethylcellul-glycerin (OPTIVE) 0.5-0.9 % ophthalmic solution Place 1 drop into both eyes 4 (four) times daily. Cannot self-administer    cloZAPine (CLOZARIL) 100 MG tablet Take 1 tablet (100 mg total) by mouth 2 (two) times daily. Take 1/2 tab every am & 3 tabs every evening (Patient taking differently: Take 300 mg by mouth at bedtime.) 05/01/2020: Patient only takes 300mg  at bedtime; psychiatrist aware   docusate sodium (DOK) 100 MG capsule TAKE ONE CAPSULE BY MOUTH TWICE A DAY AS NEEDED FOR MILD CONSTIPATION    famotidine (PEPCID) 20 MG tablet TAKE ONE TABLET BY MOUTH TWICE A DAY    loratadine (CLARITIN) 10 MG tablet Take 1 tablet (10 mg total) by mouth daily.    Multiple Vitamin (TAB-A-VITE) TABS TAKE 1 TABLET BY MOUTH ONCE DAILY.  REPLACES THERATRUM    Multiple Vitamins-Minerals (CERTAVITE/ANTIOXIDANTS) TABS TAKE 1 TABLET BY MOUTH ONCE DAILY. REPLACES THERATRUM    oxyCODONE (ROXICODONE) 5 MG immediate release tablet Take 1 tablet (5 mg total) by mouth every 6 (six) hours as needed for severe pain.    PARoxetine (PAXIL) 30 MG tablet TAKE 1 TABLET BY MOUTH IN THE MORNING. (Patient taking differently: Take 30 mg by mouth daily.)    polyethylene glycol powder (GLYCOLAX/MIRALAX) 17 GM/SCOOP powder Take 17 g by mouth 2 (two) times daily as needed.    triamcinolone ointment (KENALOG) 0.1 % Apply 1 Application topically 2 (two) times daily.    Oyster Shell 500 MG TABS Take 1 tablet (500 mg total) by mouth 2 (two) times daily.    No facility-administered encounter medications on file as of 11/10/2022.     Today's Vitals   11/10/22 1122  BP: (!) 133/92  Resp: 17  Temp: 98.9 F (37.2 C)  TempSrc: Oral  SpO2: 100%  Weight: 159 lb (72.1  kg)   Body mass index is 24.9 kg/m.   PHYSICAL EXAM GENERAL:alert, no distress and comfortable SKIN: no rash  EYES: sclera clear LUNGS: clear with normal breathing effort HEART: regular rate & rhythm, no lower extremity edema ABDOMEN: abdomen soft, non-tender and normal bowel sounds NEURO: alert & oriented x 3 with fluent speech, no focal motor/sensory deficits Breast exam: s/p R lumpectomy, incisions completely healed. Focal blanching erythema with a small nodule in the inner breast   CBC    Component Value Date/Time   WBC 8.7 09/17/2022 1149   WBC 7.7 01/24/2018 2158   RBC 4.68 09/17/2022 1149   HGB 14.6 09/17/2022 1149   HGB 14.4 08/28/2021 1013   HCT 42.7 09/17/2022 1149   HCT 41.9 08/28/2021 1013   PLT 297 09/17/2022 1149   PLT 303 08/28/2021 1013   MCV 91.2 09/17/2022 1149   MCV 91 08/28/2021 1013   MCH 31.2 09/17/2022 1149   MCHC 34.2 09/17/2022 1149   RDW 13.2 09/17/2022 1149   RDW 13.0 08/28/2021 1013   LYMPHSABS 2.7 09/17/2022 1149   LYMPHSABS 2.8 04/13/2018  1546   MONOABS 0.5 09/17/2022 1149   EOSABS 0.0 09/17/2022 1149   EOSABS 0.0 04/13/2018 1546   BASOSABS 0.0 09/17/2022 1149   BASOSABS 0.0 04/13/2018 1546     CMP     Component Value Date/Time   NA 140 09/17/2022 1149   NA 141 11/01/2019 1512   K 4.0 09/17/2022 1149   CL 106 09/17/2022 1149   CO2 28 09/17/2022 1149   GLUCOSE 95 09/17/2022 1149   BUN 15 09/17/2022 1149   BUN 12 11/01/2019 1512   CREATININE 0.87 09/17/2022 1149   CREATININE 0.85 01/19/2014 1136   CALCIUM 9.4 09/17/2022 1149   PROT 7.0 09/17/2022 1149   PROT 6.7 11/01/2019 1512   ALBUMIN 4.3 09/17/2022 1149   ALBUMIN 4.6 11/01/2019 1512   AST 22 09/17/2022 1149   ALT 26 09/17/2022 1149   ALKPHOS 100 09/17/2022 1149   BILITOT 0.8 09/17/2022 1149   GFRNONAA >60 09/17/2022 1149   GFRAA 91 11/01/2019 1512     ASSESSMENT & PLAN:Perel Zayn Hose is a 63 y.o. post-menopausal female with a history of    Malignant neoplasm of upper-inner quadrant of right breast in female; IDC, cT1aN0M0 G1 ER+/PR+/HER2- stage IA. pT1cNx oncotype 25 -Diagnosed 09/10/2022, genetic testing was negative, s/p right lumpectomy by Dr. Carolynne Edouard 10/13/2022  -We reviewed surgical path which showed larger tumor than we anticipated, 1.7 cm (pT1c), grade 1, and DCIS, margins negative. LNs were not evaluated -She has recovered well from surgery, with focal erythema at the inner R breast. Not highly concerning for cellulitis but her caregiver will keep an eye on it and let us know if area expands or she develops fever/chills.  -We discussed she had early stage breast cancer that was likely all removed in surgery. We discussed the oncotype result of 25, which predicts 12% risk of distant recurrence in 9 years with AI/Tamoxifen alone, and <1% chemo benefit in her age group.  -We reviewed her score which is the cut off of low risk in her age. Anything 26 or above we would recommend adjuvant chemo. She is pleased she does not need chemo -We discussed the roles of  adjuvant radiation and anti-estrogen therapies. She has decided to forego radiation due to the potential side effects and impact on her daily life schedule, which keeps her mental health stable.  -We do recommend anti-estrogen therapy for 5-10 years  to reduce her recurrence risk, potentially, by ~40-50% -Baseline DEXA shows mild osteopenia T score -1.8, and 2% risk of hip fracture in 10 years. Tamoxifen would be a good option but has DDI with paxil and we are not recommending to change her psych meds at this point.  -We discussed AI's, potential risk/benefit and SE's. We recommend anastrozole and she is willing to try.  -I also recommend daily calcium/vit D to help bone health -We discussed the surveillance plan.  -I encouraged her to consider cutting back/quit smoking, she declined, has no QOL if she can't smoke.  -Will let Dr. Roselind Messier know her decision. Pt seen with Dr. Mosetta Putt -Return for Survivorship in 3 months, then f/up with Dr. Mosetta Putt in 6 months       PLAN: -Surgical path, oncotype, and DEXA reviewed -Pt opted to forego radiation -Begin adjuvant anti-estrogen therapy with Anastrozole this week -Add daily calcium/vitamin D -Consider quitting/cutting back smoking -Survivorship in 3 months, f/up with Dr. Mosetta Putt in 6 months    All questions were answered. The patient knows to call the clinic with any problems, questions or concerns. No barriers to learning were detected.   Santiago Glad, NP-C 11/10/2022  Addendum I have seen the patient, examined her. I agree with the assessment and and plan and have edited the notes.   I reviewed her surgical pathology, which showed a 1.7 cm tumor, grade 1, negative margins.  Patient is very concerned about potential side effect from radiation, and does not want to proceed adjuvant radiation.  I discussed her Oncotype Dx results, which showed recurrence score 25, I do not recommend adjuvant chemotherapy.  However I do recommend adjuvant antiestrogen  therapy, to reduce her risk of recurrence in the breast and other places.  Her brother had many questions regarding the potential side effect, and is concerned about the impact of anticoagulant therapy on her mental illness.  She is already on SSRI, which will likely help with the hot flashes and mood swings.  I discussed the benefit and potential side effect in details, and encouraged her to try.  Patient agrees to try.  Will call in anastrozole today.  Will monitor closely.  All questions were answered.  Malachy Mood MD 11/10/2022

## 2022-11-10 ENCOUNTER — Inpatient Hospital Stay: Payer: Medicare Other | Attending: Hematology | Admitting: Nurse Practitioner

## 2022-11-10 ENCOUNTER — Ambulatory Visit: Payer: Medicare Other | Admitting: Radiation Oncology

## 2022-11-10 ENCOUNTER — Other Ambulatory Visit: Payer: Self-pay

## 2022-11-10 ENCOUNTER — Ambulatory Visit: Payer: Medicare Other

## 2022-11-10 ENCOUNTER — Encounter: Payer: Self-pay | Admitting: Nurse Practitioner

## 2022-11-10 VITALS — BP 133/92 | Temp 98.9°F | Resp 17 | Wt 159.0 lb

## 2022-11-10 DIAGNOSIS — Z170 Estrogen receptor positive status [ER+]: Secondary | ICD-10-CM | POA: Insufficient documentation

## 2022-11-10 DIAGNOSIS — F17210 Nicotine dependence, cigarettes, uncomplicated: Secondary | ICD-10-CM | POA: Diagnosis not present

## 2022-11-10 DIAGNOSIS — Z79899 Other long term (current) drug therapy: Secondary | ICD-10-CM | POA: Diagnosis not present

## 2022-11-10 DIAGNOSIS — M8580 Other specified disorders of bone density and structure, unspecified site: Secondary | ICD-10-CM | POA: Insufficient documentation

## 2022-11-10 DIAGNOSIS — C50211 Malignant neoplasm of upper-inner quadrant of right female breast: Secondary | ICD-10-CM | POA: Diagnosis not present

## 2022-11-10 DIAGNOSIS — Z79811 Long term (current) use of aromatase inhibitors: Secondary | ICD-10-CM | POA: Insufficient documentation

## 2022-11-10 MED ORDER — OYSTER SHELL CALCIUM/D3 500-5 MG-MCG PO TABS: 1.0000 | ORAL_TABLET | Freq: Every day | ORAL | 1 refills | Status: DC

## 2022-11-10 MED ORDER — ANASTROZOLE 1 MG PO TABS: 1.0000 mg | ORAL_TABLET | Freq: Every day | ORAL | 1 refills | Status: DC

## 2022-11-11 ENCOUNTER — Encounter: Payer: Self-pay | Admitting: *Deleted

## 2022-11-11 DIAGNOSIS — C50211 Malignant neoplasm of upper-inner quadrant of right female breast: Secondary | ICD-10-CM

## 2022-11-20 ENCOUNTER — Encounter: Payer: Self-pay | Admitting: Hematology

## 2022-11-21 ENCOUNTER — Other Ambulatory Visit: Payer: Self-pay

## 2022-11-21 ENCOUNTER — Telehealth: Payer: Self-pay

## 2022-11-21 NOTE — Progress Notes (Signed)
Pt and family decided to not take the Anastrozole per patient call reply from Santiago Glad, NP on 11/21/2022.  Pt's family sent an email to [facility] which was then scanned into the pt's chart.

## 2022-11-21 NOTE — Telephone Encounter (Signed)
Received a message from pt's nurse at the group home stating that the pt's family & pt refusing to take Anastrozole.  Group home nurse stated the family & pt are under the impression that the medication is oral chemotherapy and stated that the pt doesn't want to deal with the side effects of the skin burning.  Based off the information provided by the group home nurse, the pt and family needs further education as to what Anastrozole is and why it is being prescribed.  Recommended for pt's family to contact Dr. Mosetta Putt and Clayborn Heron Burton's office to schedule another appointment so further discussion and reeducation can be done on AI therapy.  Group home RN stated she would notify pt's family of this RN's recommendation.  Pt has been refusing Anastrozole at group home per nurse.  Notified Dr. Mosetta Putt and Santiago Glad, NP of the pt's family and pt's concerns.

## 2022-11-26 ENCOUNTER — Other Ambulatory Visit: Payer: Self-pay

## 2022-11-26 MED ORDER — REFRESH OPTIVE 0.5-0.9 % OP SOLN: 1.0000 [drp] | Freq: Four times a day (QID) | OPHTHALMIC | 0 refills | Status: DC

## 2022-12-02 ENCOUNTER — Other Ambulatory Visit: Payer: Self-pay | Admitting: Student

## 2022-12-17 ENCOUNTER — Telehealth: Payer: Self-pay

## 2022-12-17 MED ORDER — GUAIFENESIN 100 MG/5ML PO LIQD: 5.0000 mL | ORAL | 0 refills | Status: DC | PRN

## 2022-12-17 NOTE — Telephone Encounter (Signed)
Group Home calls nurse line in regards to patients cough medication.   She is requesting a prescription to be sent to patients pharmacy for Guaifenesin cough syrup.   She reports since she is in a group home they need a hard copy of prescription on file at the pharmacy. Patient still has the previous bottle on hand.  She denies the patient needing this medication for cough now.   Will forward to PCP.

## 2022-12-23 ENCOUNTER — Other Ambulatory Visit (HOSPITAL_COMMUNITY): Payer: Self-pay | Admitting: Psychiatry

## 2022-12-23 ENCOUNTER — Other Ambulatory Visit: Payer: Self-pay | Admitting: Student

## 2022-12-26 ENCOUNTER — Other Ambulatory Visit: Payer: Self-pay

## 2022-12-29 MED ORDER — ACETAMINOPHEN 325 MG PO TABS: 650.0000 mg | ORAL_TABLET | Freq: Four times a day (QID) | ORAL | 0 refills | Status: DC | PRN

## 2023-01-02 ENCOUNTER — Telehealth: Payer: Self-pay | Admitting: Nurse Practitioner

## 2023-01-02 NOTE — Telephone Encounter (Signed)
Contacted patient to scheduled appointments. Left message with appointment details and a call back number if patient had any questions or could not accommodate the time we provided.   

## 2023-01-09 ENCOUNTER — Other Ambulatory Visit: Payer: Self-pay | Admitting: Student

## 2023-01-09 MED ORDER — ACETAMINOPHEN 325 MG PO TABS: 650.0000 mg | ORAL_TABLET | Freq: Four times a day (QID) | ORAL | 0 refills | Status: DC | PRN

## 2023-01-09 NOTE — Addendum Note (Signed)
Addended by: Steva Colder on: 01/09/2023 12:14 PM   Modules accepted: Orders

## 2023-01-09 NOTE — Telephone Encounter (Signed)
Patients group home manager calls nurse line in regards to Tylenol order.   The prescription was never received by [facility] as it was set to print on 6/24.  Will resend now.

## 2023-01-19 ENCOUNTER — Telehealth: Payer: Self-pay

## 2023-01-19 DIAGNOSIS — U071 COVID-19: Secondary | ICD-10-CM

## 2023-01-19 MED ORDER — MOLNUPIRAVIR EUA 200MG CAPSULE
4.0000 | ORAL_CAPSULE | Freq: Two times a day (BID) | ORAL | 0 refills | Status: AC
Start: 2023-01-19 — End: 2023-01-24

## 2023-01-19 MED ORDER — BENZONATATE 100 MG PO CAPS
100.0000 mg | ORAL_CAPSULE | Freq: Three times a day (TID) | ORAL | 0 refills | Status: DC | PRN
Start: 2023-01-19 — End: 2023-02-24

## 2023-01-19 NOTE — Telephone Encounter (Signed)
Rosey Bath group home manager calls nurse line to report the patient tested positive for covid this morning.   She reports symptoms started yesterday with cough and body aches. She denies any SOB, congestion, nausea, vomiting or diarrhea.   She is requesting Tessalon Pearls for cough and antiviral therapy if appropriate.   Conservative measures discussed.   Will forward to PCP.

## 2023-01-19 NOTE — Telephone Encounter (Signed)
Received returned call from [facility] regarding molnupiravir. This is not covered by patient's insurance and will cost over $1,000.   Pharmacy sent fax requesting alternative, Paxlovid.   Please advise if this change is appropriate. If so, please send new prescription to patient's pharmacy.   Thanks.   Veronda Prude, RN

## 2023-01-20 ENCOUNTER — Other Ambulatory Visit (HOSPITAL_COMMUNITY): Payer: Self-pay

## 2023-01-20 NOTE — Telephone Encounter (Signed)
Durward Mallard- have you received any paperwork for PA on Molnupiravir?   Veronda Prude, RN

## 2023-01-20 NOTE — Telephone Encounter (Signed)
I called the pharmacy back and they said that medication is still requiring prior authorization.   Is there an override that the pharmacy should be using?   Veronda Prude, RN

## 2023-01-21 ENCOUNTER — Other Ambulatory Visit (HOSPITAL_COMMUNITY): Payer: Self-pay

## 2023-01-21 NOTE — Telephone Encounter (Signed)
   This is the billing info they should be using, and it should be filled for Lagevrio. I can call [email or web address] if needed!

## 2023-02-04 ENCOUNTER — Other Ambulatory Visit: Payer: Self-pay | Admitting: Student

## 2023-02-07 ENCOUNTER — Other Ambulatory Visit: Payer: Self-pay | Admitting: Student

## 2023-02-10 ENCOUNTER — Encounter: Payer: Medicare Other | Admitting: Nurse Practitioner

## 2023-02-12 ENCOUNTER — Encounter: Payer: Medicare Other | Admitting: Nurse Practitioner

## 2023-02-16 ENCOUNTER — Other Ambulatory Visit: Payer: Self-pay | Admitting: Student

## 2023-02-17 ENCOUNTER — Telehealth: Payer: Self-pay

## 2023-02-17 NOTE — Telephone Encounter (Signed)
Received call from Alba Cory regarding Hydrocortisone 2% rectal cream. She is asking if patient is still supposed to be taking medication, if so, patient will need a refill.   Advised that this medication was discontinued on 12/05/21. She requests that D/C order be faxed to her at [phone]. Faxed over D/C order to [facility].   Veronda Prude, RN

## 2023-02-24 ENCOUNTER — Inpatient Hospital Stay: Payer: Medicare Other | Attending: Hematology | Admitting: Nurse Practitioner

## 2023-02-24 ENCOUNTER — Encounter: Payer: Self-pay | Admitting: Nurse Practitioner

## 2023-02-24 VITALS — BP 126/80 | HR 124 | Temp 98.9°F | Resp 18 | Wt 159.9 lb

## 2023-02-24 DIAGNOSIS — Z170 Estrogen receptor positive status [ER+]: Secondary | ICD-10-CM | POA: Diagnosis not present

## 2023-02-24 DIAGNOSIS — M8580 Other specified disorders of bone density and structure, unspecified site: Secondary | ICD-10-CM | POA: Diagnosis not present

## 2023-02-24 DIAGNOSIS — Z87891 Personal history of nicotine dependence: Secondary | ICD-10-CM | POA: Diagnosis not present

## 2023-02-24 DIAGNOSIS — Z79899 Other long term (current) drug therapy: Secondary | ICD-10-CM | POA: Insufficient documentation

## 2023-02-24 DIAGNOSIS — C50211 Malignant neoplasm of upper-inner quadrant of right female breast: Secondary | ICD-10-CM | POA: Diagnosis present

## 2023-02-24 DIAGNOSIS — Z129 Encounter for screening for malignant neoplasm, site unspecified: Secondary | ICD-10-CM

## 2023-02-24 NOTE — Progress Notes (Addendum)
CLINIC:  Survivorship   Patient Care Team: Levin Erp, MD as PCP - General (Family Medicine) Malachy Mood, MD as Consulting Physician (Hematology) Griselda Miner, MD as Consulting Physician (General Surgery) Antony Blackbird, MD as Consulting Physician (Radiation Oncology) Pershing Proud, RN as Oncology Nurse Navigator Donnelly Angelica, RN as Registered Nurse Pollyann Samples, NP as Nurse Practitioner (Nurse Practitioner)   REASON FOR VISIT:  Routine follow-up post-treatment for a recent history of breast cancer.  BRIEF ONCOLOGIC HISTORY:  Oncology History Overview Note   Cancer Staging  Malignant neoplasm of upper-inner quadrant of right breast in female, estrogen receptor positive (HCC) Staging form: Breast, AJCC 8th Edition - Clinical stage from 09/09/2022: Stage IA (cT1a, cN0, cM0, G1, ER+, PR+, HER2-) - Signed by Malachy Mood, MD on 09/16/2022 Stage prefix: Initial diagnosis Histologic grading system: 3 grade system     Malignant neoplasm of upper-inner quadrant of right breast in female, estrogen receptor positive (HCC)  09/09/2022 Cancer Staging   Staging form: Breast, AJCC 8th Edition - Clinical stage from 09/09/2022: Stage IA (cT1a, cN0, cM0, G1, ER+, PR+, HER2-) - Signed by Malachy Mood, MD on 09/16/2022 Stage prefix: Initial diagnosis Histologic grading system: 3 grade system   09/10/2022 Pathology Results    Pathology revealed 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   09/16/2022 Initial Diagnosis   Malignant neoplasm of upper-inner quadrant of right breast in female, estrogen receptor positive (HCC)    Genetic Testing   [facility] Hereditary Breast [facility] G[facility] Cancers Guidelines-Based Panel+RNA was Negative. Report date is 09/23/2022.  This panel offered by [facility] includes sequencing and/or deletion duplication testing of the following 19 genes: ATM, BARD1, BRCA1, BRCA2, BRIP1, CDH1, CHEK2, EPCAM (Deletion/duplication testing only),  MLH1, MSH2, MSH6, NF1, PALB2, PMS2, PTEN, RAD51C, RAD51D, STK11, and TP53.    10/13/2022 Cancer Staging   Staging form: Breast, AJCC 8th Edition - Pathologic stage from 10/13/2022: pT1c, ER+, PR+, HER2- - Signed by Pollyann Samples, NP on 11/10/2022 Nuclear grade: G1   11/2022 -  Radiation Therapy   Patient declined adjuvant radiation ([place])   11/10/2022 -  Anti-estrogen oral therapy   Dr. Mosetta Putt recommended adjuvant anti-estrogen therapy. Pt initially agreed but did not take Anastrozole   02/24/2023 Survivorship   SCP delivered by Santiago Glad, NP     INTERVAL HISTORY:  Ms. Greaney presents to the Survivorship Clinic today for our initial meeting to review her survivorship care plan detailing her treatment course for breast cancer, as well as monitoring long-term side effects of that treatment, education regarding health maintenance, screening, and overall wellness and health promotion.     Overall, Ms. Wollman reports feeling well in general. [employer] is back in session and she has started working again. Denies breast concerns.      REVIEW OF SYSTEMS:  Review of Systems - Oncology Breast: Denies any new nodularity, masses, tenderness, nipple changes, or nipple discharge.      ONCOLOGY TREATMENT TEAM:  1. Surgeon:  Dr. Carolynne Edouard at [facility] 2. Medical Oncologist: Dr. Mosetta Putt  3. Radiation Oncologist: Dr. Roselind Messier (declined RT)    PAST MEDICAL/SURGICAL HISTORY:  Past Medical History:  Diagnosis Date   Chronic constipation    Eczema    Emphysema of lung (HCC)    in epic CT angio Chest-- 01-24-2018   GAD (generalized anxiety disorder)    GERD (gastroesophageal reflux disease)    History of adenomatous polyp of colon  History of basal cell carcinoma (BCC) excision    05-13-2011   s/p moh's left upper lip   History of panic attacks    Hyperlipidemia    Pre-diabetes    Psoriasis    Rectal polyp    Schizophrenia (HCC)    followed by [facility] , dr Pernell Dupre   Seasonal allergic  rhinitis    Tachycardia    followed by pcp, first dx 2015, no meds for this,  last EKG in epic dated 02-09-2018   Past Surgical History:  Procedure Laterality Date   BREAST BIOPSY Right 09/09/2022   [place] RT BREAST BX W LOC DEV 1ST LESION IMG BX SPEC US GUIDE 09/09/2022 [facility] MAMMOGRAPHY   BREAST BIOPSY  10/09/2022   MM RT RADIOACTIVE SEED LOC MAMMO GUIDE 10/09/2022 [facility] MAMMOGRAPHY   BREAST LUMPECTOMY WITH RADIOACTIVE SEED LOCALIZATION Right 10/13/2022   Procedure: RIGHT BREAST LUMPECTOMY WITH RADIOACTIVE SEED LOCALIZATION;  Surgeon: Griselda Miner, MD;  Location: [facility];  Service: General;  Laterality: Right;   COLONOSCOPY     TRANSANAL EXCISION OF RECTAL MASS N/A 03/17/2019   Procedure: TRANSANAL EXCISION OF RECTAL POLYP;  Surgeon: Romie Levee, MD;  Location: [facility];  Service: General;  Laterality: N/A;     ALLERGIES:  Allergies  Allergen Reactions   Penicillins Hives    REACTION: unspecified/ whelts  Has patient had a PCN reaction causing immediate rash, facial/tongue/throat swelling, SOB or lightheadedness with hypotension: Yes Has patient had a PCN reaction causing severe rash involving mucus membranes or skin necrosis: No Has patient had a PCN reaction that required hospitalization: No Has patient had a PCN reaction occurring within the last 10 years: no If all of the above answers are "NO", then may proceed with Cephalosporin use.       CURRENT MEDICATIONS:  Outpatient Encounter Medications as of 02/24/2023  Medication Sig Note   atorvastatin (LIPITOR) 40 MG tablet Take 1 tablet (40 mg total) by mouth at bedtime.    calcium-vitamin D (OSCAL WITH D) 500-5 MG-MCG tablet Take 1 tablet by mouth daily with breakfast.    cloZAPine (CLOZARIL) 100 MG tablet Take 1 tablet (100 mg total) by mouth 2 (two) times daily. Take 1/2 tab every am & 3 tabs every evening (Patient taking differently: Take 300 mg by mouth at bedtime.) 05/01/2020: Patient only  takes 300mg  at bedtime; psychiatrist aware   famotidine (PEPCID) 20 MG tablet TAKE ONE TABLET BY MOUTH TWICE A DAY    Multiple Vitamin (TAB-A-VITE) TABS TAKE ONE TABLET BY MOUTH DAILY    Multiple Vitamins-Minerals (CERTAVITE/ANTIOXIDANTS) TABS TAKE 1 TABLET BY MOUTH ONCE DAILY. REPLACES THERATRUM    OPTIVE 0.5-0.9 % ophthalmic solution PLACE 1 DROP INTO BOTH EYES 4 (FOUR) TIMES DAILY. CANNOT SELF-ADMINISTER    PARoxetine (PAXIL) 30 MG tablet TAKE 1 TABLET BY MOUTH IN THE MORNING. (Patient taking differently: Take 30 mg by mouth daily.)    [DISCONTINUED] loratadine (CLARITIN) 10 MG tablet Take 1 tablet (10 mg total) by mouth daily.    acetaminophen (TYLENOL) 325 MG tablet Take 2 tablets (650 mg total) by mouth every 6 (six) hours as needed. (Patient not taking: Reported on 02/24/2023)    albuterol (VENTOLIN HFA) 108 (90 Base) MCG/ACT inhaler Inhale 1-2 puffs into the lungs every 6 (six) hours as needed for wheezing or shortness of breath. (Patient not taking: Reported on 02/24/2023)    [DISCONTINUED] benzonatate (TESSALON PERLES) 100 MG capsule Take 1 capsule (100 mg total) by mouth 3 (  three) times daily as needed for cough.    [DISCONTINUED] betamethasone valerate ointment (VALISONE) 0.1 % Apply 1 Application topically 2 (two) times daily.    [DISCONTINUED] docusate sodium (DOK) 100 MG capsule TAKE ONE CAPSULE BY MOUTH TWICE A DAY AS NEEDED FOR MILD CONSTIPATION    [DISCONTINUED] guaiFENesin (ROBITUSSIN) 100 MG/5ML liquid Take 5 mLs by mouth every 4 (four) hours as needed for cough or to loosen phlegm.    [DISCONTINUED] oxyCODONE (ROXICODONE) 5 MG immediate release tablet Take 1 tablet (5 mg total) by mouth every 6 (six) hours as needed for severe pain. (Patient not taking: Reported on 02/24/2023)    [DISCONTINUED] Oyster Shell 500 MG TABS Take 1 tablet (500 mg total) by mouth 2 (two) times daily.    [DISCONTINUED] polyethylene glycol powder (GLYCOLAX/MIRALAX) 17 GM/SCOOP powder Take 17 g by mouth 2  (two) times daily as needed.    [DISCONTINUED] triamcinolone ointment (KENALOG) 0.1 % APPLY 1 APPLICATION TOPICALLY TWO (TWO) TIMES DAILY    No facility-administered encounter medications on file as of 02/24/2023.     ONCOLOGIC FAMILY HISTORY:  Family History  Problem Relation Age of Onset   Breast cancer Sister 79       BRCA1/2 negative   Breast cancer Sister 26   Breast cancer Maternal Grandmother    Stroke Other    Heart failure Other    Colon cancer Neg Hx    Colon polyps Neg Hx    Rectal cancer Neg Hx    Stomach cancer Neg Hx    Esophageal cancer Neg Hx      GENETIC COUNSELING/TESTING: Yes, Negative  SOCIAL HISTORY:  Cnythia Ruminski is single and lives at [facility]. Does not have children.  Ms. Cuadrado is currently working.  She continues to smoke cigarettes, started ~age 35, 1 PPD.   PHYSICAL EXAMINATION:  Vital Signs:   Vitals:   02/24/23 1234  BP: 126/80  Pulse: (!) 124  Resp: 18  Temp: 98.9 F (37.2 C)  SpO2: 99%   Filed Weights   02/24/23 1234  Weight: 159 lb 14.4 oz (72.5 kg)   General: Well-nourished, well-appearing female in no acute distress.   HEENT:  Sclerae anicteric.  Lymph: No cervical, supraclavicular, or infraclavicular lymphadenopathy noted on palpation.  Respiratory:  breathing non-labored.  Neuro: No focal deficits. Steady gait.  Psych: Mood and affect normal and appropriate for situation.  Extremities: No edema. Skin: Warm and dry. Breast exam: R breast without nipple discharge or inversion. S/p lumpectomy, incision healed with mild scar tissue.  No palpable mass or nodularity that I could appreciate.  Left breast exam deferred  LABORATORY DATA:  None for this visit.  DIAGNOSTIC IMAGING:  None for this visit.      ASSESSMENT AND PLAN:  Ms.. Groller is a pleasant 62 y.o. 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 She presents to the Survivorship Clinic for our initial meeting and routine follow-up post-completion of treatment for breast cancer.    1. Stage I right breast cancer:  Ms. Brozek has recovered well from definitive surgery.  She declined adjuvant radiation and antiestrogen therapy.  Today, a comprehensive survivorship care plan and treatment summary was reviewed with the patient today detailing her breast cancer diagnosis, treatment course, potential late/long-term effects of treatment, appropriate follow-up care with recommendations for the future, and patient education resources.  A copy of this summary, along with a letter  will be sent to the patient's primary care provider via mail/fax/In Basket message after today's visit.    2. Bone health:  She is at risk for bone demineralization.  Her last DEXA scan was 09/18/22, which showed osteopenia, lowest T score -1.8 in RFN.  In the meantime, she was encouraged to increase her consumption of foods rich in calcium, as well as increase her weight-bearing activities.  She was given education on specific activities to promote bone health.  3. Cancer screening:  Due to Ms. Hestand's history and her age, she should receive screening for skin cancers, colon cancer, lung, and gynecologic cancers.  The information and recommendations are listed on the patient's comprehensive care plan/treatment summary and were reviewed in detail with the patient.    4. Health maintenance and wellness promotion: Ms. Norgren was encouraged to consume 5-7 servings of fruits and vegetables per day. She was also encouraged to engage in moderate to vigorous exercise for 30 minutes per day most days of the week. She was instructed to limit her alcohol consumption and was encouraged stop smoking.     5. Support services/counseling: Mrs. Cobey appears to be coping well.  She was offered support today through active listening and expressive supportive counseling.  She was given  information regarding our available services and encouraged to contact me with any questions or for help enrolling in any of our support group/programs.    Dispo:   -Facility is requesting documentation and a cancel order for anastrozole.  -Patient declined antiestrogen therapy, discontinue anastrozole -Follow-up with Dr. Carolynne Edouard 07/2023  -Mammogram due in 09/2023 -Follow-up at the cancer center 11/2023, or sooner if needed   Orders Placed This Encounter  Procedures   CT CHEST LUNG CA SCREEN LOW DOSE W/O CM    Standing Status:   Future    Standing Expiration Date:   02/24/2024    Order Specific Question:   Preferred Imaging Location?    Answer:   [facility]   MM DIAG BREAST TOMO BILATERAL    Standing Status:   Future    Standing Expiration Date:   02/24/2024    Order Specific Question:   Reason for Exam (SYMPTOM  OR DIAGNOSIS REQUIRED)    Answer:   R lumpectomy 09/2022    Order Specific Question:   Preferred imaging location?    Answer:   [facility]     She is welcome to return back to the Survivorship Clinic at any time; no additional follow-up needed at this time. Consider referral back to survivorship as a long-term survivor for continued surveillance   Santiago Glad, NP Survivorship Program [facility] [phone]   Note: PRIMARY CARE PROVIDER Levin Erp, MD [phone]

## 2023-02-24 NOTE — Progress Notes (Signed)
Lacie's office note for today's visit, Work Engineer, petroleum, and medication orders change faxed to [employer] at [facility] [phone]).  Fax confirmation received.

## 2023-03-17 ENCOUNTER — Other Ambulatory Visit: Payer: Self-pay | Admitting: Student

## 2023-03-23 ENCOUNTER — Other Ambulatory Visit: Payer: Self-pay | Admitting: Student

## 2023-03-23 NOTE — Telephone Encounter (Signed)
Received call from [facility] at [facility] regarding prescription refills.   She is requesting refills on the following.  Betamethasone cream Minerin cream  Advised that these are not on current medication list. Steward Drone reports that patient uses these creams on her face.  Will forward to PCP.   Veronda Prude, RN

## 2023-03-25 ENCOUNTER — Other Ambulatory Visit: Payer: Self-pay | Admitting: Student

## 2023-03-31 NOTE — Telephone Encounter (Signed)
Steward Drone from [facility] returns call to nurse line regarding skin creams.   She either needs new prescription for Betamethasone and minerin creams or a D/C order.   Please advise.   Veronda Prude, RN

## 2023-04-09 NOTE — Telephone Encounter (Signed)
Letter created and faxed to [facility] at [phone].  Veronda Prude, RN

## 2023-04-13 ENCOUNTER — Ambulatory Visit (INDEPENDENT_AMBULATORY_CARE_PROVIDER_SITE_OTHER): Payer: Medicare Other | Admitting: Student

## 2023-04-13 VITALS — BP 137/75 | HR 114 | Ht 66.0 in | Wt 157.0 lb

## 2023-04-13 DIAGNOSIS — Z23 Encounter for immunization: Secondary | ICD-10-CM

## 2023-04-13 DIAGNOSIS — I951 Orthostatic hypotension: Secondary | ICD-10-CM

## 2023-04-13 MED ORDER — SHINGRIX 50 MCG/0.5ML IM SUSR: 0.5000 mL | Freq: Once | INTRAMUSCULAR | 0 refills | Status: AC

## 2023-04-13 NOTE — Progress Notes (Signed)
    SUBJECTIVE:   CHIEF COMPLAINT / HPI:   Jean Williams is a 62 y.o. female  presenting for routine visit.  Patient reports this appointment was scheduled by her assisted living facility.  She denies having any concerns or complaints.  She denies having rashes or being concerned about redness on her face.  PERTINENT  PMH / PSH: Reviewed and updated   OBJECTIVE:   BP 137/75   Pulse (!) 114   Ht 5\' 6"  (1.676 m)   Wt 157 lb (71.2 kg)   LMP 06/10/2011   SpO2 97%   BMI 25.34 kg/m   Well-appearing, no acute distress Cardio: Regular rate, regular rhythm, no murmurs on exam. Pulm: Clear, no wheezing, no crackles. No increased work of breathing Abdominal: bowel sounds present, soft, non-tender, non-distended Extremities: no peripheral edema  Neuro: alert and oriented x3, speech normal in content, no facial asymmetry, strength intact and equal bilaterally in UE and LE, pupils equal and reactive to light.  Psych:  Cognition and judgment appear intact. Alert, communicative  and cooperative with normal attention span and concentration. No apparent delusions, illusions, hallucinations      04/13/2023    3:47 PM 01/17/2022    1:36 PM 12/05/2021    1:44 PM  PHQ9 SCORE ONLY  PHQ-9 Total Score 8 8 8       ASSESSMENT/PLAN:   Orthostatic hypotension Blood pressure elevated initially in the office on recheck improved.  Consider blood pressure medication in the future if she continues to be elevated.   Health maintenance: Flu shot today Shingrix prescription provided patient Discussed COVID vaccination, patient will get at pharmacy Discussed Medicare annual wellness visit, patient aware that she may be scheduled for an appointment soon.  Glendale Chard, DO [facility]

## 2023-04-13 NOTE — Patient Instructions (Signed)
   Future Appointments  Date Time Provider Department Center  08/24/2023  9:40 AM [facility] DIAG TOMO 1 [facility] GI-BREAST CE  12/04/2023  1:00 PM [facility] LAB [facility] None  12/04/2023  1:30 PM Boscia, Kathlynn Grate, NP [facility] None    Please arrive 15 minutes before your appointment to ensure smooth check in process.    Please call the clinic at [phone] if your symptoms worsen or you have any concerns.  Thank you for allowing me to participate in your care, Dr. Glendale Chard [facility]

## 2023-04-13 NOTE — Assessment & Plan Note (Addendum)
Blood pressure elevated initially in the office on recheck improved.  Consider blood pressure medication in the future if she continues to be elevated.

## 2023-04-15 ENCOUNTER — Other Ambulatory Visit: Payer: Self-pay | Admitting: Student

## 2023-04-15 ENCOUNTER — Other Ambulatory Visit: Payer: Self-pay | Admitting: Nurse Practitioner

## 2023-05-12 ENCOUNTER — Other Ambulatory Visit: Payer: Self-pay | Admitting: Student

## 2023-05-13 ENCOUNTER — Other Ambulatory Visit: Payer: Medicare Other

## 2023-05-13 ENCOUNTER — Ambulatory Visit: Payer: Medicare Other | Admitting: Hematology

## 2023-06-03 ENCOUNTER — Other Ambulatory Visit: Payer: Self-pay | Admitting: Student

## 2023-06-09 ENCOUNTER — Other Ambulatory Visit: Payer: Self-pay | Admitting: Student

## 2023-07-09 ENCOUNTER — Other Ambulatory Visit: Payer: Self-pay | Admitting: Student

## 2023-08-06 ENCOUNTER — Other Ambulatory Visit: Payer: Self-pay | Admitting: Student

## 2023-08-11 ENCOUNTER — Other Ambulatory Visit: Payer: Self-pay

## 2023-08-11 NOTE — Telephone Encounter (Signed)
 Received call from Brandon, Med Tech at [facility] requesting rx refills on Refresh eye drops, triamcinolone  ointment and Paxil .  It appears that Paxil  has not been prescribed since 2019 and triamcinolone  is no longer on medication list.   If patient is not supposed to be on these medications, we will need a letter indicating that she is no longer taking them.   Will forward request to PCP.   [name], RN

## 2023-08-12 ENCOUNTER — Telehealth: Payer: Self-pay

## 2023-08-12 MED ORDER — TRIAMCINOLONE ACETONIDE 0.025 % EX OINT
1.0000 | TOPICAL_OINTMENT | Freq: Two times a day (BID) | CUTANEOUS | 0 refills | Status: DC
Start: 2023-08-12 — End: 2023-09-21

## 2023-08-12 MED ORDER — REFRESH OPTIVE 0.5-0.9 % OP SOLN: 1.0000 [drp] | Freq: Four times a day (QID) | OPHTHALMIC | 0 refills | Status: DC

## 2023-08-12 NOTE — Telephone Encounter (Signed)
 Jean Williams with [facility] calls nurse line requesting a refill on Triamcinolone  Cream.   She reports the patient uses this daily and is out.   She requests this go to [facility].  I do not see this on medication list.   Will forward to PCP.

## 2023-08-24 ENCOUNTER — Encounter: Payer: Self-pay | Admitting: Student

## 2023-08-24 ENCOUNTER — Ambulatory Visit: Payer: Medicare Other | Admitting: Student

## 2023-08-24 VITALS — BP 112/70 | HR 106 | Ht 66.0 in | Wt 164.4 lb

## 2023-08-24 DIAGNOSIS — F209 Schizophrenia, unspecified: Secondary | ICD-10-CM

## 2023-08-24 DIAGNOSIS — Z79899 Other long term (current) drug therapy: Secondary | ICD-10-CM | POA: Diagnosis not present

## 2023-08-24 DIAGNOSIS — Z23 Encounter for immunization: Secondary | ICD-10-CM | POA: Diagnosis not present

## 2023-08-24 DIAGNOSIS — L989 Disorder of the skin and subcutaneous tissue, unspecified: Secondary | ICD-10-CM

## 2023-08-24 LAB — POCT GLYCOSYLATED HEMOGLOBIN (HGB A1C): Hemoglobin A1C: 6 % — AB (ref 4.0–5.6)

## 2023-08-24 NOTE — Progress Notes (Signed)
    SUBJECTIVE:   CHIEF COMPLAINT / HPI:   In [facility] currently Interested in getting into [facility] - an assisted living on [address] Wants gaurdianship back  States Brother and Sister are gaurdian Government social research officer and [place]) - in system Media tab appears [name] is Land with psychiatry outpatient and states that she has been on clozapine for a very long time since she was a child-states that she would like to be on a different medication but has not had success with this She states that she works in [employer] and she has been working for 20 years and finds joy in her job She reports various skin issues, including a lesion on her nose that has been present for a while but has not grown or bled.  She is interested in knowing her blood sugar levels, as she enjoys smoking, soft drinks, and sugar, which she describes as her "quality of life."  PERTINENT  PMH / PSH: Asthma, schizophrenia  OBJECTIVE:   BP 112/70   Pulse (!) 106   Ht 5\' 6"  (1.676 m)   Wt 164 lb 6 oz (74.6 kg)   LMP 06/10/2011   SpO2 95%   BMI 26.53 kg/m   General: Well appearing, NAD, awake, alert, responsive to questions Psych: Somewhat slowed responses, does not appear to be running to any internal stimuli Head: Various erythematous skin lesions throughout face, raised circular lesion on tip of nose with hemorrhagic center CV: Regular rate and rhythm no murmurs rubs or gallops Respiratory: Clear to ausculation bilaterally, no wheezes rales or crackles, chest rises symmetrically,  no increased work of breathing    ASSESSMENT/PLAN:   Assessment & Plan Schizophrenia, unspecified type (HCC) Currently living in an assisted living facility and wants to be in a different 1.  She would like to gain her own guardianship. -Referral to PM&R-Dr. Kieth Brightly for cognitive evaluation -Patient to continue seeing her psychiatrist and continue Clozaril per their expertise High risk medication use On  clozapine for very long time -CBC with differential -CMP -A1c Skin lesion of face Concern for basal cell carcinoma -Referral to dermatology   Levin Erp, MD [facility]

## 2023-08-24 NOTE — Assessment & Plan Note (Signed)
Currently living in an assisted living facility and wants to be in a different 1.  She would like to gain her own guardianship. -Referral to PM&R-Dr. Kieth Brightly for cognitive evaluation -Patient to continue seeing her psychiatrist and continue Clozaril per their expertise

## 2023-08-24 NOTE — Patient Instructions (Addendum)
It was great to see you! Thank you for allowing me to participate in your care!   Our plans for today:  -I have placed referral to physical medicine and rehabilitation for evaluation of regarding your guardianship -I am getting labs today -I am referring you to dermatology for a skin lesion of your nose as it may be a skin cancer  Take care and seek immediate care sooner if you develop any concerns.  Levin Erp, MD

## 2023-08-25 LAB — CBC WITH DIFFERENTIAL/PLATELET
Basophils Absolute: 0 10*3/uL (ref 0.0–0.2)
Basos: 0 %
EOS (ABSOLUTE): 0 10*3/uL (ref 0.0–0.4)
Eos: 0 %
Hematocrit: 44.8 % (ref 34.0–46.6)
Hemoglobin: 15 g/dL (ref 11.1–15.9)
Immature Grans (Abs): 0 10*3/uL (ref 0.0–0.1)
Immature Granulocytes: 0 %
Lymphocytes Absolute: 2.9 10*3/uL (ref 0.7–3.1)
Lymphs: 33 %
MCH: 31.1 pg (ref 26.6–33.0)
MCHC: 33.5 g/dL (ref 31.5–35.7)
MCV: 93 fL (ref 79–97)
Monocytes Absolute: 0.6 10*3/uL (ref 0.1–0.9)
Monocytes: 7 %
Neutrophils Absolute: 5.2 10*3/uL (ref 1.4–7.0)
Neutrophils: 60 %
Platelets: 349 10*3/uL (ref 150–450)
RBC: 4.82 x10E6/uL (ref 3.77–5.28)
RDW: 13.1 % (ref 11.7–15.4)
WBC: 8.8 10*3/uL (ref 3.4–10.8)

## 2023-08-25 LAB — COMPREHENSIVE METABOLIC PANEL
ALT: 17 [IU]/L (ref 0–32)
AST: 16 [IU]/L (ref 0–40)
Albumin: 4.3 g/dL (ref 3.9–4.9)
Alkaline Phosphatase: 128 [IU]/L — ABNORMAL HIGH (ref 44–121)
BUN/Creatinine Ratio: 14 (ref 12–28)
BUN: 12 mg/dL (ref 8–27)
Bilirubin Total: 0.7 mg/dL (ref 0.0–1.2)
CO2: 27 mmol/L (ref 20–29)
Calcium: 9.8 mg/dL (ref 8.7–10.3)
Chloride: 105 mmol/L (ref 96–106)
Creatinine, Ser: 0.88 mg/dL (ref 0.57–1.00)
Globulin, Total: 2 g/dL (ref 1.5–4.5)
Glucose: 89 mg/dL (ref 70–99)
Potassium: 4.3 mmol/L (ref 3.5–5.2)
Sodium: 145 mmol/L — ABNORMAL HIGH (ref 134–144)
Total Protein: 6.3 g/dL (ref 6.0–8.5)
eGFR: 74 mL/min/{1.73_m2} (ref >59)

## 2023-08-27 ENCOUNTER — Ambulatory Visit (HOSPITAL_COMMUNITY): Payer: Medicare Other

## 2023-09-01 ENCOUNTER — Ambulatory Visit
Admission: RE | Admit: 2023-09-01 | Discharge: 2023-09-01 | Disposition: A | Discharge status: Home / Self Care | Payer: Medicare Other | Source: Ambulatory Visit | Attending: Nurse Practitioner

## 2023-09-01 ENCOUNTER — Other Ambulatory Visit: Payer: Self-pay | Admitting: Student

## 2023-09-01 DIAGNOSIS — Z170 Estrogen receptor positive status [ER+]: Secondary | ICD-10-CM

## 2023-09-02 ENCOUNTER — Telehealth: Payer: Self-pay | Admitting: Student

## 2023-09-02 NOTE — Telephone Encounter (Signed)
 Called brother and confirmed DOB. Discussed elevated Alk phos  and need for follow up in next 6 weeks.

## 2023-09-04 ENCOUNTER — Ambulatory Visit (HOSPITAL_COMMUNITY)
Admission: RE | Admit: 2023-09-04 | Discharge: 2023-09-04 | Disposition: A | Discharge status: Home / Self Care | Payer: Medicare Other | Source: Ambulatory Visit | Attending: Nurse Practitioner | Admitting: Nurse Practitioner

## 2023-09-04 DIAGNOSIS — Z129 Encounter for screening for malignant neoplasm, site unspecified: Secondary | ICD-10-CM | POA: Insufficient documentation

## 2023-09-04 DIAGNOSIS — Z87891 Personal history of nicotine dependence: Secondary | ICD-10-CM | POA: Diagnosis present

## 2023-09-07 ENCOUNTER — Telehealth: Payer: Self-pay | Admitting: Family Medicine

## 2023-09-07 NOTE — Telephone Encounter (Signed)
 Noted last PHQ-9 was positive for thoughts of self harm during visit with Dr. Laroy Apple Attempted to reach patient at facility number No answer x2, left HIPAA compliant voicemail asking for caregiver to call us back.  Latrelle Dodrill, MD

## 2023-09-09 NOTE — Telephone Encounter (Signed)
 Received VM from [facility] regarding missed call from our office.   Attempted to call back at number provided, no answer. LVM asking them to return call to office to further discuss.   Veronda Prude, RN

## 2023-09-11 NOTE — Telephone Encounter (Signed)
 Reached patient at her facility this evening. She denies having any SI/HI, says that was a mistake on the form completion. She is doing well and was appreciative of the call.  Latrelle Dodrill, MD

## 2023-09-11 NOTE — Telephone Encounter (Signed)
 Called [facility] again and was able to reach the administrator. She reported that she is not on site at the facility where this patient lives, but provided me the # of that facility [phone]).  Called facility, who said she is at work and will likely be home around 4pm. Will plan to call back later today to speak with patient.  Latrelle Dodrill, MD

## 2023-09-21 ENCOUNTER — Other Ambulatory Visit: Payer: Self-pay

## 2023-09-21 MED ORDER — TRIAMCINOLONE ACETONIDE 0.025 % EX OINT: 1.0000 | TOPICAL_OINTMENT | Freq: Two times a day (BID) | CUTANEOUS | 0 refills | Status: DC

## 2023-09-25 ENCOUNTER — Telehealth: Payer: Self-pay

## 2023-09-25 NOTE — Telephone Encounter (Signed)
 Steward Drone with [facility] calls nurse line in regards to Triamcinolone.   She reports the order she has on file for patient is the 0.1%, however 0.025% was recently called in on 3/17.  Advised 0.025% is currently on her medication list.   She reports if she is not on the 0.1% they need a d/c order stating this.   She asks I fax to [phone].  Will forward to PCP for clarification.

## 2023-10-01 NOTE — Telephone Encounter (Signed)
 Letter drafted and printed for [facility] to D/c 0.1 % per instructions from Dr. Laroy Apple.   Faxed to provided number.   Veronda Prude, RN

## 2023-10-05 ENCOUNTER — Telehealth: Payer: Self-pay

## 2023-10-05 NOTE — Telephone Encounter (Addendum)
 Called the patient to relay message below as per Santiago Glad NP, patient had no further questions at this time and had full understanding.   ----- Message from Pollyann Samples sent at 10/05/2023  8:42 AM EDT ----- My nurse,  Please let pt know lung cancer screening CT is benign. I'm attaching PCP for incidentals.   Thanks Lacie NP

## 2023-10-27 ENCOUNTER — Other Ambulatory Visit: Payer: Self-pay | Admitting: Student

## 2023-10-28 ENCOUNTER — Other Ambulatory Visit: Payer: Self-pay | Admitting: Student

## 2023-11-17 ENCOUNTER — Other Ambulatory Visit: Payer: Self-pay | Admitting: Student

## 2023-11-27 ENCOUNTER — Other Ambulatory Visit: Payer: Self-pay | Admitting: Student

## 2023-12-03 ENCOUNTER — Other Ambulatory Visit: Payer: Self-pay

## 2023-12-03 DIAGNOSIS — Z170 Estrogen receptor positive status [ER+]: Secondary | ICD-10-CM

## 2023-12-04 ENCOUNTER — Other Ambulatory Visit: Payer: Self-pay | Admitting: Student

## 2023-12-04 ENCOUNTER — Inpatient Hospital Stay (HOSPITAL_BASED_OUTPATIENT_CLINIC_OR_DEPARTMENT_OTHER): Payer: Medicare Other | Admitting: Nurse Practitioner

## 2023-12-04 ENCOUNTER — Inpatient Hospital Stay: Payer: Medicare Other | Attending: Nurse Practitioner

## 2023-12-04 VITALS — BP 120/80 | HR 110 | Temp 98.0°F | Resp 17 | Wt 162.2 lb

## 2023-12-04 DIAGNOSIS — C50211 Malignant neoplasm of upper-inner quadrant of right female breast: Secondary | ICD-10-CM | POA: Diagnosis present

## 2023-12-04 DIAGNOSIS — Z170 Estrogen receptor positive status [ER+]: Secondary | ICD-10-CM | POA: Diagnosis not present

## 2023-12-04 LAB — CBC WITH DIFFERENTIAL (CANCER CENTER ONLY)
Abs Immature Granulocytes: 0.03 10*3/uL (ref 0.00–0.07)
Basophils Absolute: 0 10*3/uL (ref 0.0–0.1)
Basophils Relative: 0 %
Eosinophils Absolute: 0 10*3/uL (ref 0.0–0.5)
Eosinophils Relative: 0 %
HCT: 43.8 % (ref 36.0–46.0)
Hemoglobin: 14.9 g/dL (ref 12.0–15.0)
Immature Granulocytes: 0 %
Lymphocytes Relative: 34 %
Lymphs Abs: 3.2 10*3/uL (ref 0.7–4.0)
MCH: 30.6 pg (ref 26.0–34.0)
MCHC: 34 g/dL (ref 30.0–36.0)
MCV: 89.9 fL (ref 80.0–100.0)
Monocytes Absolute: 0.8 10*3/uL (ref 0.1–1.0)
Monocytes Relative: 8 %
Neutro Abs: 5.3 10*3/uL (ref 1.7–7.7)
Neutrophils Relative %: 58 %
Platelet Count: 324 10*3/uL (ref 150–400)
RBC: 4.87 MIL/uL (ref 3.87–5.11)
RDW: 13.1 % (ref 11.5–15.5)
WBC Count: 9.3 10*3/uL (ref 4.0–10.5)
nRBC: 0 % (ref 0.0–0.2)

## 2023-12-04 LAB — CMP (CANCER CENTER ONLY)
ALT: 19 U/L (ref 0–44)
AST: 17 U/L (ref 15–41)
Albumin: 4.3 g/dL (ref 3.5–5.0)
Alkaline Phosphatase: 116 U/L (ref 38–126)
Anion gap: 4 — ABNORMAL LOW (ref 5–15)
BUN: 12 mg/dL (ref 8–23)
CO2: 30 mmol/L (ref 22–32)
Calcium: 9.2 mg/dL (ref 8.9–10.3)
Chloride: 107 mmol/L (ref 98–111)
Creatinine: 0.76 mg/dL (ref 0.44–1.00)
GFR, Estimated: >60 mL/min (ref >60)
Glucose, Bld: 84 mg/dL (ref 70–99)
Potassium: 3.9 mmol/L (ref 3.5–5.1)
Sodium: 141 mmol/L (ref 135–145)
Total Bilirubin: 0.8 mg/dL (ref 0.0–1.2)
Total Protein: 6.8 g/dL (ref 6.5–8.1)

## 2023-12-04 NOTE — Progress Notes (Signed)
 Patient Care Team: Genora Kidd, MD as PCP - General (Family Medicine) Sonja Union, MD as Consulting Physician (Hematology) Caralyn Chandler, MD as Consulting Physician (General Surgery) Retta Caster, MD as Consulting Physician (Radiation Oncology) Auther Bo, RN as Oncology Nurse Navigator Alane Hsu, RN as Registered Nurse Burton, Lacie K, NP as Nurse Practitioner (Nurse Practitioner)  Clinic Day:  12/13/2023  Referring physician: Genora Kidd, MD  ASSESSMENT & PLAN:   Assessment & Plan: Malignant neoplasm of upper-inner quadrant of right breast in female, estrogen receptor positive (HCC) --We discussed her imaging findings and the biopsy results in great details. -Giving the early stage disease, she is a candidate for lumpectomy. She is agreeable with that. She was seen by Dr. Alethea Andes today and likely will proceed with surgery soon.  -Given the small size of tumor, she does not need Oncotype or adjuvant chemotherapy. -Given the strong ER and PR expression in her postmenopausal status,  adjuvant endocrine therapy with aromatase inhibitor for a total of 5-10 years to reduce the risk of cancer recurrence was recommended. Potential benefits and side effects were discussed with patient and she is interested. - She declined adjuvant radiation and antiestrogen/aromatase inhibitor treatment. - She continues breast cancer surveillance yearly. -Previous 3D diagnostic mammogram performed 09/01/2023 was benign.  Recall in February 2026.   Skin Lesion  Patient states she does have a spot on her left cheek, adjacent to the bridge of the nose, which primary care believes is skin cancer.  She is unsure about a referral.  Asked that she contact her primary care to give her referral to dermatology for further evaluation.  She voiced understanding and agreement.  Smoking Patient admits to being a long-term smoker.  Does have regular CT for lung cancer screening.  This is followed by primary  care.  Encouraged smoking cessation.  Plan: Labs reviewed. -Stable and unremarkable. Reviewed diagnostic mammogram from 09/01/2023.  Results benign. New 3D diagnostic mammogram should be scheduled for February 2026. Encourage smoking cessation. Labs and follow-up in 1 year, sooner if needed.  The patient understands the plans discussed today and is in agreement with them.  She knows to contact our office if she develops concerns prior to her next appointment.  I provided 25 minutes of face-to-face time during this encounter and > 50% was spent counseling as documented under my assessment and plan.    Sharyon Deis, NP  [facility] - A DEPT OF [facility] [address] [place] [address] Dept: [phone] Dept Fax: [phone]   No orders of the defined types were placed in this encounter.     CHIEF COMPLAINT:  CC: right breast cancer, estrogen receptor positive   Current Treatment:  surveillance   INTERVAL HISTORY:  Der is here today for repeat clinical assessment. She was last seen by Lacie, NP on 02/24/2023 for survivoship care plan visit. She had declined treatment with adjuvant radiation and antiestrogen therapy. She had diagnostic mammogram done 09/01/2023 with benign findings. A new diagnostic mammogram should be scheduled in 08/2024. She denies chest pain, chest pressure, or shortness of breath. She denies headaches or visual disturbances. She denies abdominal pain, nausea, vomiting, or changes in bowel or bladder habits.  She denies fevers or chills. She denies pain. Her appetite is good. Her weight has been stable.  I have reviewed the past medical history, past surgical history, social history and family history with the patient  and they are unchanged from previous note.  ALLERGIES:  is allergic to penicillins.  MEDICATIONS:  Current Outpatient Medications  Medication Sig Dispense Refill    acetaminophen  (TYLENOL ) 325 MG tablet Take 2 tablets (650 mg total) by mouth every 6 (six) hours as needed. (Patient not taking: Reported on 02/24/2023) 30 tablet 0   albuterol  (VENTOLIN  HFA) 108 (90 Base) MCG/ACT inhaler Inhale 1-2 puffs into the lungs every 6 (six) hours as needed for wheezing or shortness of breath. (Patient not taking: Reported on 02/24/2023) 2 each 2   atorvastatin  (LIPITOR) 40 MG tablet TAKE ONE TABLET BY MOUTH AT BEDTIME FOR CHOLESTEROL 281 tablet 7   Calcium  Carb-Cholecalciferol 500-5 MG-MCG TABS TAKE ONE TABLET BY MOUTH DAILY WITH BREAKFAST 90 tablet 4   cloZAPine  (CLOZARIL ) 100 MG tablet Take 1 tablet (100 mg total) by mouth 2 (two) times daily. Take 1/2 tab every am & 3 tabs every evening (Patient taking differently: Take 300 mg by mouth at bedtime.) 105 tablet 3   famotidine  (PEPCID ) 20 MG tablet TAKE ONE TABLET BY MOUTH TWICE A DAY 60 tablet 0   guaiFENesin  (ROBITUSSIN) 100 MG/5ML liquid Take 5 mLs by mouth every 4 (four) hours as needed for cough or to loosen phlegm. 120 mL 0   loratadine  (CLARITIN ) 10 MG tablet TAKE ONE TABLET BY MOUTH DAILY 30 tablet 10   Multiple Vitamin (TAB-A-VITE) TABS TAKE ONE TABLET BY MOUTH DAILY 30 tablet 0   Multiple Vitamins-Minerals (CERTAVITE/ANTIOXIDANTS) TABS TAKE 1 TABLET BY MOUTH ONCE DAILY. REPLACES THERATRUM 30 tablet 0   OPTIVE 0.5-0.9 % ophthalmic solution PLACE 1 DROP INTO BOTH EYES 4 (FOUR) TIMES DAILY. CANNOT SELF-ADMINISTER 15 mL 0   PARoxetine  (PAXIL ) 30 MG tablet TAKE 1 TABLET BY MOUTH IN THE MORNING. (Patient taking differently: Take 30 mg by mouth daily.) 30 tablet 0   triamcinolone  (KENALOG ) 0.025 % ointment APPLY 1 APPLICATION TOPICALLY TWO (TWO) TIMES DAILY 30 g 0   No current facility-administered medications for this visit.    HISTORY OF PRESENT ILLNESS:   Oncology History Overview Note   Cancer Staging  Malignant neoplasm of upper-inner quadrant of right breast in female, estrogen receptor positive (HCC) Staging  form: Breast, AJCC 8th Edition - Clinical stage from 09/09/2022: Stage IA (cT1a, cN0, cM0, G1, ER+, PR+, HER2-) - Signed by Sonja [place], MD on 09/16/2022 Stage prefix: Initial diagnosis Histologic grading system: 3 grade system     Malignant neoplasm of upper-inner quadrant of right breast in female, estrogen receptor positive (HCC)  09/09/2022 Cancer Staging   Staging form: Breast, AJCC 8th Edition - Clinical stage from 09/09/2022: Stage IA (cT1a, cN0, cM0, G1, ER+, PR+, HER2-) - Signed by Sonja [place], MD on 09/16/2022 Stage prefix: Initial diagnosis Histologic grading system: 3 grade system   09/10/2022 Pathology Results    Pathology revealed 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   09/16/2022 Initial Diagnosis   Malignant neoplasm of upper-inner quadrant of right breast in female, estrogen receptor positive (HCC)    Genetic Testing   [facility] He[facility] Cancers Guidelines-Based Panel+RNA was Negative. Report date is 09/23/2022.  This panel offered by [employer] includes sequencing and/or deletion duplication testing of the following 19 genes: ATM, BARD1, BRCA1, BRCA2, BRIP1, CDH1, CHEK2, EPCAM (Deletion/duplication testing only), MLH1, MSH2, MSH6, NF1, PALB2, PMS2, PTEN, RAD51C, RAD51D, STK11, and TP53.    10/13/2022 Cancer Staging   Staging form: Breast, AJCC 8th Edition - Pathologic stage from  10/13/2022: pT1c, ER+, PR+, HER2- - Signed by Burton, Lacie K, NP on 11/10/2022 Nuclear grade: G1   11/2022 -  Radiation Therapy   Patient declined adjuvant radiation ([place])   11/10/2022 -  Anti-estrogen oral therapy   Dr. Maryalice Smaller recommended adjuvant anti-estrogen therapy. Pt initially agreed but did not take Anastrozole    02/24/2023 Survivorship   SCP delivered by Lacie Burton, NP       REVIEW OF SYSTEMS:   Constitutional: Denies fevers, chills or abnormal weight loss Eyes: Denies blurriness of vision Ears, nose, mouth, throat, and face:  Denies mucositis or sore throat Respiratory: Denies cough, dyspnea or wheezes Cardiovascular: Denies palpitation, chest discomfort or lower extremity swelling Gastrointestinal:  Denies nausea, heartburn or change in bowel habits Skin: Denies abnormal skin rashes. Has small skin lesion on left cheek, which she was told was likely skin cancer. Recommend she contact her PCP to get dermatological referral.  Lymphatics: Denies new lymphadenopathy or easy bruising Neurological:Denies numbness, tingling or new weaknesses Behavioral/Psych: Mood is stable, no new changes  All other systems were reviewed with the patient and are negative.   VITALS:   Today's Vitals   12/04/23 1246  BP: 120/80  Pulse: (!) 110  Resp: 17  Temp: 98 F (36.7 C)  SpO2: 98%  Weight: 162 lb 3.2 oz (73.6 kg)   Body mass index is 26.18 kg/m.   Wt Readings from Last 3 Encounters:  12/04/23 162 lb 3.2 oz (73.6 kg)  08/24/23 164 lb 6 oz (74.6 kg)  04/13/23 157 lb (71.2 kg)    Body mass index is 26.18 kg/m.  Performance status (ECOG): 1 - Symptomatic but completely ambulatory  PHYSICAL EXAM:   GENERAL:alert, no distress and comfortable SKIN: skin color, texture, turgor are normal, no rashes or significant lesions EYES: normal, Conjunctiva are pink and non-injected, sclera clear OROPHARYNX:no exudate, no erythema and lips, buccal mucosa, and tongue normal  NECK: supple, thyroid normal size, non-tender, without nodularity LYMPH:  no palpable lymphadenopathy in the cervical, axillary or inguinal LUNGS: clear to auscultation and percussion with normal breathing effort. Some mild expiratory wheezing auscultated throughout  the lung fields.  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  No palpable lumps or masses.  No nipple inversion or nipple discharge.  There is no axillary lymphadenopathy on the right.  Left breast is without palpable lumps or masses.  There is no nipple inversion or nipple discharge.  There is no axillary lymphadenopathy on the left.  LABORATORY DATA:  I have reviewed the data as listed    Component Value Date/Time   NA 141 12/04/2023 1229   NA 145 (H) 08/24/2023 1600   K 3.9 12/04/2023 1229   CL 107 12/04/2023 1229   CO2 30 12/04/2023 1229   GLUCOSE 84 12/04/2023 1229   BUN 12 12/04/2023 1229   BUN 12 08/24/2023 1600   CREATININE 0.76 12/04/2023 1229   CREATININE 0.85 01/19/2014 1136   CALCIUM  9.2 12/04/2023 1229   PROT 6.8 12/04/2023 1229   PROT 6.3 08/24/2023 1600   ALBUMIN 4.3 12/04/2023 1229   ALBUMIN 4.3 08/24/2023 1600   AST 17 12/04/2023 1229   ALT 19 12/04/2023 1229   ALKPHOS 116 12/04/2023 1229   BILITOT 0.8 12/04/2023 1229  GFRNONAA >60 12/04/2023 1229   GFRAA 91 11/01/2019 1512    No results found for: "SPEP", "UPEP"  Lab Results  Component Value Date   WBC 9.3 12/04/2023   NEUTROABS 5.3 12/04/2023   HGB 14.9 12/04/2023   HCT 43.8 12/04/2023   MCV 89.9 12/04/2023   PLT 324 12/04/2023      Chemistry      Component Value Date/Time   NA 141 12/04/2023 1229   NA 145 (H) 08/24/2023 1600   K 3.9 12/04/2023 1229   CL 107 12/04/2023 1229   CO2 30 12/04/2023 1229   BUN 12 12/04/2023 1229   BUN 12 08/24/2023 1600   CREATININE 0.76 12/04/2023 1229   CREATININE 0.85 01/19/2014 1136      Component Value Date/Time   CALCIUM  9.2 12/04/2023 1229   ALKPHOS 116 12/04/2023 1229   AST 17 12/04/2023 1229   ALT 19 12/04/2023 1229   BILITOT 0.8 12/04/2023 1229       RADIOGRAPHIC STUDIES: I have personally reviewed the radiological images as listed and agreed with the findings in the report. No results found.

## 2023-12-04 NOTE — Assessment & Plan Note (Addendum)
--  We discussed her imaging findings and the biopsy results in great details. -Giving the early stage disease, she is a candidate for lumpectomy. She is agreeable with that. She was seen by Dr. Alethea Andes today and likely will proceed with surgery soon.  -Given the small size of tumor, she does not need Oncotype or adjuvant chemotherapy. -Given the strong ER and PR expression in her postmenopausal status,  adjuvant endocrine therapy with aromatase inhibitor for a total of 5-10 years to reduce the risk of cancer recurrence was recommended. Potential benefits and side effects were discussed with patient and she is interested. - She declined adjuvant radiation and antiestrogen/aromatase inhibitor treatment. - She continues breast cancer surveillance yearly. -Previous 3D diagnostic mammogram performed 09/01/2023 was benign.  Recall in February 2026.

## 2023-12-08 ENCOUNTER — Other Ambulatory Visit: Payer: Self-pay | Admitting: Student

## 2023-12-09 ENCOUNTER — Telehealth: Payer: Self-pay

## 2023-12-09 DIAGNOSIS — R059 Cough, unspecified: Secondary | ICD-10-CM

## 2023-12-09 MED ORDER — GUAIFENESIN 100 MG/5ML PO LIQD
5.0000 mL | ORAL | 0 refills | Status: DC | PRN
Start: 2023-12-09 — End: 2024-02-26

## 2023-12-09 NOTE — Telephone Encounter (Signed)
 Received call to nurse line from [facility].   They are requesting a refill on patients cough syrup, Guaifenesin .  She denies any active cough symptoms. She reports since this medication is on her active list at [facility], they need the "physical" medication as well.   If PCP does not wish to refill this, we will need to send a d/c order.   Will forward to PCP.

## 2023-12-13 ENCOUNTER — Encounter: Payer: Self-pay | Admitting: Nurse Practitioner

## 2023-12-15 ENCOUNTER — Encounter: Payer: Self-pay | Admitting: *Deleted

## 2023-12-24 ENCOUNTER — Other Ambulatory Visit: Payer: Self-pay | Admitting: Student

## 2023-12-29 ENCOUNTER — Other Ambulatory Visit: Payer: Self-pay | Admitting: Student

## 2023-12-30 NOTE — Telephone Encounter (Signed)
 Received call from [place] at [facility]. She is requesting refills on the docusate sodium .  Please advise if patient is no longer supposed to be taking medication or if refill can be sent to pharmacy.   [name], RN

## 2024-01-06 ENCOUNTER — Other Ambulatory Visit: Payer: Self-pay | Admitting: Student

## 2024-01-06 DIAGNOSIS — U071 COVID-19: Secondary | ICD-10-CM

## 2024-01-14 ENCOUNTER — Other Ambulatory Visit: Payer: Self-pay

## 2024-01-14 MED ORDER — ALBUTEROL SULFATE HFA 108 (90 BASE) MCG/ACT IN AERS: 1.0000 | INHALATION_SPRAY | Freq: Four times a day (QID) | RESPIRATORY_TRACT | 2 refills | Status: DC | PRN

## 2024-01-14 MED ORDER — TRIAMCINOLONE ACETONIDE 0.025 % EX OINT: TOPICAL_OINTMENT | Freq: Two times a day (BID) | CUTANEOUS | 0 refills | Status: DC

## 2024-01-19 ENCOUNTER — Telehealth: Payer: Self-pay

## 2024-01-19 DIAGNOSIS — K5900 Constipation, unspecified: Secondary | ICD-10-CM

## 2024-01-19 NOTE — Telephone Encounter (Signed)
 Received fax from [facility] for Polyethylene Glycol 17G PWD. I dont see this on current medication list. If you want patient to start this medication. Please send RX to pharmacy with dose, instructions, and duration. Nelson Land, CMA

## 2024-01-20 MED ORDER — POLYETHYLENE GLYCOL 3350 17 GM/SCOOP PO POWD: 17.0000 g | Freq: Every day | ORAL | 0 refills | Status: DC | PRN

## 2024-01-29 ENCOUNTER — Other Ambulatory Visit: Payer: Self-pay

## 2024-01-31 MED ORDER — TAB-A-VITE PO TABS: 1.0000 | ORAL_TABLET | Freq: Every day | ORAL | 11 refills | Status: AC

## 2024-02-03 ENCOUNTER — Other Ambulatory Visit: Payer: Self-pay

## 2024-02-03 MED ORDER — FAMOTIDINE 20 MG PO TABS: 20.0000 mg | ORAL_TABLET | Freq: Two times a day (BID) | ORAL | 0 refills | Status: DC

## 2024-02-08 ENCOUNTER — Encounter: Payer: Self-pay | Admitting: Family Medicine

## 2024-02-08 ENCOUNTER — Ambulatory Visit: Admitting: Family Medicine

## 2024-02-08 VITALS — BP 127/81 | HR 124 | Ht 66.0 in | Wt 164.6 lb

## 2024-02-08 DIAGNOSIS — Z23 Encounter for immunization: Secondary | ICD-10-CM

## 2024-02-08 DIAGNOSIS — I700 Atherosclerosis of aorta: Secondary | ICD-10-CM

## 2024-02-08 DIAGNOSIS — R7303 Prediabetes: Secondary | ICD-10-CM | POA: Diagnosis not present

## 2024-02-08 DIAGNOSIS — Z789 Other specified health status: Secondary | ICD-10-CM

## 2024-02-08 DIAGNOSIS — K219 Gastro-esophageal reflux disease without esophagitis: Secondary | ICD-10-CM | POA: Diagnosis not present

## 2024-02-08 DIAGNOSIS — K5900 Constipation, unspecified: Secondary | ICD-10-CM

## 2024-02-08 DIAGNOSIS — J439 Emphysema, unspecified: Secondary | ICD-10-CM

## 2024-02-08 DIAGNOSIS — R52 Pain, unspecified: Secondary | ICD-10-CM

## 2024-02-08 DIAGNOSIS — R053 Chronic cough: Secondary | ICD-10-CM | POA: Diagnosis present

## 2024-02-08 DIAGNOSIS — Z124 Encounter for screening for malignant neoplasm of cervix: Secondary | ICD-10-CM | POA: Diagnosis not present

## 2024-02-08 DIAGNOSIS — F17200 Nicotine dependence, unspecified, uncomplicated: Secondary | ICD-10-CM

## 2024-02-08 DIAGNOSIS — R911 Solitary pulmonary nodule: Secondary | ICD-10-CM | POA: Diagnosis not present

## 2024-02-08 MED ORDER — METFORMIN HCL 500 MG PO TABS: 500.0000 mg | ORAL_TABLET | Freq: Two times a day (BID) | ORAL | 5 refills | Status: DC

## 2024-02-08 MED ORDER — ACETAMINOPHEN 325 MG PO TABS: 650.0000 mg | ORAL_TABLET | Freq: Four times a day (QID) | ORAL | 0 refills | Status: AC | PRN

## 2024-02-08 MED ORDER — POLYETHYLENE GLYCOL 3350 17 GM/SCOOP PO POWD: 17.0000 g | Freq: Every day | ORAL | 0 refills | Status: DC

## 2024-02-08 NOTE — Patient Instructions (Signed)
 It was wonderful to see you today.  Please bring ALL of your medications with you to every visit.   Today we talked about:  Acid Reflux - Please stop taking your famotidine  (Pepcid ) for the next two weeks until your next appointment. We need you to be off of this medication so that we can test for a bacteria that can cause the symptoms called H. pylori.  We will test you for this at your next appointment.  Daily cough/tobacco use-I believe your daily cough is most likely because of a disease called COPD.  Especially with your wheezing in your lungs.  I do not think the Tessalon  Perles will be a good solution for this long-term you most likely need a daily inhaler.  The albuterol  should not be a daily inhaler thus we need a better preventative inhaler for you.  For the spot on your nose that has concern for cancer a referral was previously made to [facility] dermatology.  Please call their office to make an appointment as their phone number is [phone].   For the referral to evaluate you for capacity and possibly regain your guardianship a referral was already placed to Dr. Corina.  You can call him at [phone] to make an appointment.  You have a follow-up appointment on 22 August.  This will be for an annual physical.  You will also be getting a Pap smear at this time.  You have an appointment with Dr. Koval for breathing tests this week on Wednesday at 8:30 AM.  Thank you for choosing [facility].   Please call [phone] with any questions about today's appointment.  Areta Saliva, MD  Family Medicine

## 2024-02-08 NOTE — Progress Notes (Signed)
    SUBJECTIVE:   CHIEF COMPLAINT / HPI:   Medication refill   Patient presented from [facility].  She arrived with caregiver, Erminio.  Presented for medication refill for famotidine , Tessalon  Perles, Dulcolax, Tylenol .  ALF needed updated refill information for patient.  Reflux Patient says that she has been having reflux for the last 10 years or so.  She endorses regurgitation and burning in her throat after eating.  She takes famotidine  twice daily and has been doing this for years.  Denies ever being tested for H. pylori.  Says she was always on famotidine .  Erminio her caregiver says that patient does eat late at night and eats large portions.  Endorses eating many foods that would trigger her GERD.  Denies blood in her stool, weight loss, dysphagia.  Daily cough  She has a dry cough, she has been taking tessalon  pearls for this daily.  She has been using the albuterol  inhaler twice a day for years.  Patient has extensive tobacco use history since the age of 11. Erminio says that patient has that she would rather die than quit smoking.  Patient says smoking is very important for her quality of life.  She is not interested in quitting.  PERTINENT  PMH / PSH: tobacco use, GERD, schizophrenia, prediabetes   OBJECTIVE:   BP 127/81   Pulse (!) 124   Ht 5' 6 (1.676 m)   Wt 164 lb 9.6 oz (74.7 kg)   LMP 06/10/2011   SpO2 96%   BMI 26.57 kg/m   General: well appearing, in no acute distress CV: RRR, radial pulses equal and palpable Resp: Normal work of breathing on room air, expiratory wheezing throughout lung fields, slightly diminished breath sounds at the apices, no crackles or rhonchi  Neuro: Alert & Oriented x 4  Psyc: cognition slightly slowed, easily distracted, not responding to internal stimuli, slightly poor insight   ASSESSMENT/PLAN:   Assessment & Plan Chronic cough Pulmonary emphysema, unspecified emphysema type (HCC) TOBACCO DEPENDENCE Patient's chronic cough  most likely due to COPD secondary to chronic tobacco use.  This is most likely why the albuterol  inhaler seems to slightly improve her symptoms.  Patient has evidence of emphysema on lung CT and there is expiratory wheezing on exam.  GERD could be contributing to chronic cough however this is slightly less likely. -Schedule with Dr. Koval on this Wednesday for PFTs. - Will start on a inhaler for COPD pending this appointment with him.  Discussed this with the patient who is amenable. - Counseled on decreasing cigarette use, patient is not interested at this time. Chronic GERD Patient with chronic GERD.  Could be due to multiple lifestyle triggers such as late-night food intake, other foods that trigger reflux.  However she has not been tested for H. pylori in the past per EMR search and has had GERD for a very long time.  Unlikely EOE as no elevated eosinophils on CBC. - Hold famotidine  for 2 weeks, H. pylori breath test at next visit. - If H. pylori is negative could do trial of PPI. - Counseled regarding triggers for GERD.   Patient is scheduled for annual physical as she is due for Pap smear and has not had physical in the last 5 years.  Areta Saliva, MD [facility]

## 2024-02-08 NOTE — Assessment & Plan Note (Signed)
 Patient's chronic cough most likely due to COPD secondary to chronic tobacco use.  This is most likely why the albuterol  inhaler seems to slightly improve her symptoms.  Patient has evidence of emphysema on lung CT and there is expiratory wheezing on exam.  GERD could be contributing to chronic cough however this is slightly less likely. -Schedule with Dr. Koval on this Wednesday for PFTs. - Will start on a inhaler for COPD pending this appointment with him.  Discussed this with the patient who is amenable. - Counseled on decreasing cigarette use, patient is not interested at this time.

## 2024-02-08 NOTE — Assessment & Plan Note (Signed)
 Patient with chronic GERD.  Could be due to multiple lifestyle triggers such as late-night food intake, other foods that trigger reflux.  However she has not been tested for H. pylori in the past per EMR search and has had GERD for a very long time.  Unlikely EOE as no elevated eosinophils on CBC. - Hold famotidine  for 2 weeks, H. pylori breath test at next visit. - If H. pylori is negative could do trial of PPI. - Counseled regarding triggers for GERD.

## 2024-02-10 ENCOUNTER — Ambulatory Visit (INDEPENDENT_AMBULATORY_CARE_PROVIDER_SITE_OTHER): Admitting: Pharmacist

## 2024-02-10 ENCOUNTER — Encounter: Payer: Self-pay | Admitting: Pharmacist

## 2024-02-10 VITALS — BP 109/72 | HR 123 | Ht 67.0 in | Wt 164.8 lb

## 2024-02-10 DIAGNOSIS — F17200 Nicotine dependence, unspecified, uncomplicated: Secondary | ICD-10-CM | POA: Diagnosis not present

## 2024-02-10 DIAGNOSIS — J432 Centrilobular emphysema: Secondary | ICD-10-CM | POA: Diagnosis not present

## 2024-02-10 DIAGNOSIS — J439 Emphysema, unspecified: Secondary | ICD-10-CM

## 2024-02-10 MED ORDER — UMECLIDINIUM-VILANTEROL 62.5-25 MCG/ACT IN AEPB: 1.0000 | INHALATION_SPRAY | Freq: Every day | RESPIRATORY_TRACT | 11 refills | Status: AC

## 2024-02-10 NOTE — Patient Instructions (Signed)
 Nice to see you today!  Your breathing test showed LUNG AGE of 89 years.   You also had reversibility following your nebulizer treatment.   Medication Changes: - START ANORO Ellipta  1 inhalation daily.   - Continue all other medication the same.  Tobacco Patient Instructions Cutting back on smoking is one of the most important decisions you can make for your current and future health. Consider what you dislike about smoking and how quitting could personally benefit you. Try to cut down from 20 to 15 cigarettes per day.   DELAY: Tell yourself that you'll wait for the next craving. Do it every time! DEEP BREATHS: One reason smoking feels good is because you breathe in deeply to inhale. Take four slow, deep breaths and feel the relaxation without the hamful effects of cigarettes. DRINK WATER: Drink a glass of cool water. It will give your hands and mouth something to do and will help flush the nicotine  out of your system faster. DIVERT: Do something else -- brush your teeth, take a walk, call a friend who can offer you support. Just moving onto something other than thinking about cigarettes will move you through the craving.  Please arrive 10-15 minutes prior to your scheduled visit time.

## 2024-02-10 NOTE — Assessment & Plan Note (Signed)
 Patient has been experiencing chronic cough and taking albuterol  once daily in the morning.  Spirometry evaluation reveals Mild restrictive lung disease. Post nebulized albuterol  tx revealed restrictive lung disease with reversibility of FEV1.    -Inititate anoro ellipta  (umeclidinium/vilaterol) once daily  -Educated patient on purpose, proper use, potential adverse effects including jitteriness. -Reviewed results of pulmonary function tests.  Pt verbalized understanding of results and education.

## 2024-02-10 NOTE — Progress Notes (Signed)
.    S:     Chief Complaint  Patient presents with   Medication Management    PFT   63 y.o. female who presents for COPD evaluation, education, and management. Patient arrives in good spirits and presents without any assistance.   Patient was referred and last seen by Primary Care Provider, Dr. Nicholas, on 02/08/24.    PMH is significant for Tobacco dependence, schizophrenia, and lung impairment.   Medication adherence reported albuterol  Rescue inhaler use frequency: albuterol  once daily in the morning  Age when started using tobacco on a daily basis 10. Number of cigarettes/day 19.    Reports tobacco use adds to her quality of life giving focus and clarity  Motivation to quit: none  O: Review of Systems  Respiratory:  Positive for cough and shortness of breath.   All other systems reviewed and are negative.   Physical Exam Constitutional:      Appearance: Normal appearance.  Pulmonary:     Effort: Pulmonary effort is normal.  Neurological:     Mental Status: She is alert.  Psychiatric:        Mood and Affect: Mood normal.        Judgment: Judgment normal.     Vitals:   02/10/24 0827  BP: 109/72  Pulse: (!) 123  SpO2: 94%    mMRC score=  <2  See scanned report or Documentation Flowsheet (discrete results - PFTs) for Spirometry results. Patient provided good effort while attempting spirometry.   Lung Age = 89 Albuterol  Neb  Lot# [id number]  Exp:12/04/2024   A/P: Patient has been experiencing chronic cough and taking albuterol  once daily in the morning.  Spirometry evaluation reveals Mild restrictive lung disease. Post nebulized albuterol  tx revealed restrictive lung disease with reversibility of FEV1.    -Inititate anoro ellipta  (umeclidinium/vilaterol) once daily  -Educated patient on purpose, proper use, potential adverse effects including jitteriness. -Reviewed results of pulmonary function tests.  Pt verbalized understanding of results and education.      Tobacco use disorder- Denies any interest in quitting or cessation, as tobacco adds to quality of life. Discussed/encouraged intake reduction from 20-15 cigarettes per day.     Written patient instructions provided.   Total time in face to face counseling 67 minutes.    Follow-up:  PCP clinic visit 02/26/24 w/ Dr Nicholas Patient seen with Fonda Blase, PharmD Candidate - PY3 student and Calton Nash, PharmD Candidate - PY4 student.

## 2024-02-10 NOTE — Assessment & Plan Note (Signed)
 Tobacco use disorder- Denies any interest in quitting or cessation, as tobacco adds to quality of life. Discussed/encouraged intake reduction from 20-15 cigarettes per day.

## 2024-02-11 NOTE — Progress Notes (Signed)
 Reviewed and agree with Dr Macky Lower plan.

## 2024-02-12 ENCOUNTER — Ambulatory Visit: Admitting: Family Medicine

## 2024-02-16 ENCOUNTER — Other Ambulatory Visit: Payer: Self-pay | Admitting: Family Medicine

## 2024-02-23 ENCOUNTER — Other Ambulatory Visit: Payer: Self-pay

## 2024-02-23 DIAGNOSIS — K5900 Constipation, unspecified: Secondary | ICD-10-CM

## 2024-02-24 MED ORDER — POLYETHYLENE GLYCOL 3350 17 GM/SCOOP PO POWD: 17.0000 g | Freq: Every day | ORAL | 3 refills | Status: AC

## 2024-02-26 ENCOUNTER — Other Ambulatory Visit (HOSPITAL_COMMUNITY)
Admission: RE | Admit: 2024-02-26 | Discharge: 2024-02-26 | Disposition: A | Discharge status: Home / Self Care | Source: Ambulatory Visit | Attending: Family Medicine | Admitting: Family Medicine

## 2024-02-26 ENCOUNTER — Ambulatory Visit (INDEPENDENT_AMBULATORY_CARE_PROVIDER_SITE_OTHER): Payer: Self-pay | Admitting: Family Medicine

## 2024-02-26 ENCOUNTER — Encounter: Payer: Self-pay | Admitting: Family Medicine

## 2024-02-26 VITALS — BP 130/80 | HR 121 | Ht 67.0 in | Wt 162.8 lb

## 2024-02-26 DIAGNOSIS — Z124 Encounter for screening for malignant neoplasm of cervix: Secondary | ICD-10-CM | POA: Diagnosis present

## 2024-02-26 DIAGNOSIS — Z1151 Encounter for screening for human papillomavirus (HPV): Secondary | ICD-10-CM | POA: Insufficient documentation

## 2024-02-26 DIAGNOSIS — R7303 Prediabetes: Secondary | ICD-10-CM | POA: Diagnosis present

## 2024-02-26 DIAGNOSIS — Z01419 Encounter for gynecological examination (general) (routine) without abnormal findings: Secondary | ICD-10-CM | POA: Diagnosis present

## 2024-02-26 DIAGNOSIS — Z8679 Personal history of other diseases of the circulatory system: Secondary | ICD-10-CM | POA: Diagnosis not present

## 2024-02-26 DIAGNOSIS — E7849 Other hyperlipidemia: Secondary | ICD-10-CM

## 2024-02-26 DIAGNOSIS — K219 Gastro-esophageal reflux disease without esophagitis: Secondary | ICD-10-CM

## 2024-02-26 LAB — POCT GLYCOSYLATED HEMOGLOBIN (HGB A1C): HbA1c, POC (prediabetic range): 5.9 % (ref 5.7–6.4)

## 2024-02-26 NOTE — Patient Instructions (Signed)
 It was wonderful to see you today.  Please bring ALL of your medications with you to every visit.   Today we talked about:  We will need to follow up with your gastroenterologist to see when you need your next colonoscopy.   Continue your new inhaler for your emphysema.   I am glad your acid reflux has resolved.   Today we did your pap smear I will let you know about the results of this.   Work on continuing to decrease your soda intake and start doing more weight bearing or resistance exercises to help your bones.   I will check your cholesterol levels today   You are due for your flu and shingles vaccines. You can get this at the pharmacy.    Thank you for choosing [facility].   Please call [phone] with any questions about today's appointment.   Areta Saliva, MD  Family Medicine

## 2024-02-26 NOTE — Progress Notes (Signed)
    SUBJECTIVE:   Chief compliant/HPI: annual examination  Jean Williams is a 63 y.o. who presents today for an annual exam.    History tabs reviewed and updated.   Review of systems form reviewed and notable for none.   OBJECTIVE:   BP 130/80   Pulse (!) 121   Ht 5' 7 (1.702 m)   Wt 162 lb 12.8 oz (73.8 kg)   LMP 06/10/2011   SpO2 100%   BMI 25.50 kg/m   General: in no acute distress CV: RRR, radial pulses equal and palpable, no BLE edema  Resp: Normal work of breathing on room air, CTAB Abd: Soft, non tender, non distended  GU (chaperoned by CMA), no vulvar lesions, no discharge, no CMT  Psyc: impaired insight, otherwise organized thought, not responding to internal stimuli, anxious appearing    ASSESSMENT/PLAN:   Assessment & Plan Prediabetes Takes metformin . Patient acknowledges that she should make dietary changes but is resistant to at this time. She understands her diet affects her health.  A1c from 6.0 6 months ago to 5.9.  - Working on decreasing soda intake and increasing water intake  - Continue metformin .  Chronic GERD Now resolved. Had asked patient to stop famotidine  for 2 weeks after last visit to do h pylori testing, but patient no longer struggles with GERD or dyspepsia.  - Discussed dietary changes (eating smaller meals not late at night, etc) to prevent symptoms  - Can consider h pylori testing in the future if she gets symptoms again  - Discontinue famotidine .  Other hyperlipidemia History of hyperlipidemia with hypertriglyceridemia. ASCVD risk 7.5-8.9%. Patient currently on atorvastatin   - Continue atorvastatin   - Lipid panel today  Cervical cancer screening Pap with HPV cotesting completed today  H/O sinus tachycardia Patient has history of sinus tachycardia with consistently elevated heart rate over multiple visits. Asymptomatic.  Most likely due to a combination of increased adrenergic stimuli secondary to COPD, anxiety, and caffeine  intake.    Annual Examination  See AVS for age appropriate recommendations  PHQ score patient declined screening BP reviewed and at goal.  Patient is not sexually active.  Patient has an advanced directive.   Considered the following items based upon USPSTF recommendations: Diabetes screening: ordered, 5.9 from 6.0.   - Patient is working on decreasing her soda intake and increasing water.  Screening for elevated cholesterol: ordered HIV testing: discussed Hepatitis C: discussed Hepatitis B: discussed Syphilis if at high risk: discussed GC/CT not at high risk and not ordered. Osteoporosis screening: DEXA not ordered. Last DEXA in 09/2022 showed osteopenia.  Reviewed risk factors for latent tuberculosis and not indicated   Cervical cancer screening: due for Pap today, cytology + HPV ordered Breast cancer screening: Has history of breast cancer, followed by survivorship clinic, last mammogram 08/2023 Colorectal cancer screening: had a colonoscopy in 2023 with 2 sessile polyps removed, unclear regarding recommendations for follow up. Will reach out to [facility] regarding this   Lung cancer screening: done. Recommend follow up in a year  Vaccinations recommended shingles vaccine at the pharmacy.   Follow up in 1  year or sooner if indicated.  MyChart Activation: Already signed up  Areta Saliva, MD [facility]

## 2024-02-27 LAB — LIPID PANEL
Chol/HDL Ratio: 3 ratio (ref 0.0–4.4)
Cholesterol, Total: 104 mg/dL (ref 100–199)
HDL: 35 mg/dL — ABNORMAL LOW (ref >39)
LDL Chol Calc (NIH): 20 mg/dL (ref 0–99)
Triglycerides: 347 mg/dL — ABNORMAL HIGH (ref 0–149)
VLDL Cholesterol Cal: 49 mg/dL — ABNORMAL HIGH (ref 5–40)

## 2024-02-28 ENCOUNTER — Encounter: Payer: Self-pay | Admitting: Family Medicine

## 2024-02-28 ENCOUNTER — Ambulatory Visit: Payer: Self-pay | Admitting: Family Medicine

## 2024-02-28 DIAGNOSIS — Z8679 Personal history of other diseases of the circulatory system: Secondary | ICD-10-CM | POA: Insufficient documentation

## 2024-02-28 NOTE — Assessment & Plan Note (Signed)
 Patient has history of sinus tachycardia with consistently elevated heart rate over multiple visits. Asymptomatic.  Most likely due to a combination of increased adrenergic stimuli secondary to COPD, anxiety, and caffeine intake.

## 2024-02-28 NOTE — Assessment & Plan Note (Signed)
 Now resolved. Had asked patient to stop famotidine  for 2 weeks after last visit to do h pylori testing, but patient no longer struggles with GERD or dyspepsia.  - Discussed dietary changes (eating smaller meals not late at night, etc) to prevent symptoms  - Can consider h pylori testing in the future if she gets symptoms again  - Discontinue famotidine .

## 2024-02-29 LAB — CYTOLOGY - PAP
Adequacy: ABSENT
Comment: NEGATIVE
Diagnosis: NEGATIVE
High risk HPV: NEGATIVE

## 2024-03-02 ENCOUNTER — Other Ambulatory Visit: Payer: Self-pay | Admitting: Family Medicine

## 2024-03-09 ENCOUNTER — Telehealth: Payer: Self-pay | Admitting: Gastroenterology

## 2024-03-09 NOTE — Telephone Encounter (Signed)
 Good morning Dr. Leigh,  I received a call from Verneita Daring, she is the pt assistant living scheduler and stated that she spoke with the pt and her family and they would like for her to have a colonoscopy due to her having history of cancer. Patient has recall placed in for April of year 2028. Would you please advise on how we can schedule this patient.   Thank you.

## 2024-03-09 NOTE — Telephone Encounter (Signed)
 I see the patient has a history of breast cancer and followed by oncology.  She just had a colonoscopy in 2023 without any high risk lesions.  Her next exam under most circumstances should be done in 2028, unless oncology feels that she needs it sooner however I do not see anything in her chart that would make me think she would need a colonoscopy sooner than 5 years.  If her oncologist feels strongly that she needs it now I can discuss further with them.  Can you please let them know my recommendation and that again if oncology is recommending it we can certainly discuss it further

## 2024-03-10 NOTE — Telephone Encounter (Signed)
 Note made available in patient's mychart account.

## 2024-03-10 NOTE — Telephone Encounter (Signed)
   Jean Williams is calling back. She stated the the patient needs a note sent that states she does not need to have a colonoscopy earlier. It should be sent to [facility] [phone] attn: Jean

## 2024-03-18 ENCOUNTER — Telehealth: Payer: Self-pay

## 2024-03-18 NOTE — Telephone Encounter (Signed)
 Received call from [facility] regarding famotidine  prescription.   Advised that this was discontinued as of 02/26/24. Jean Williams is requesting d/c order for medication.   Faxed D/c order to [facility].   [name], RN

## 2024-03-21 ENCOUNTER — Other Ambulatory Visit: Payer: Self-pay

## 2024-04-01 ENCOUNTER — Other Ambulatory Visit: Payer: Self-pay | Admitting: Nurse Practitioner

## 2024-04-04 ENCOUNTER — Ambulatory Visit: Admitting: Family Medicine

## 2024-04-04 VITALS — BP 109/78 | HR 100 | Ht 66.0 in | Wt 162.6 lb

## 2024-04-04 DIAGNOSIS — K219 Gastro-esophageal reflux disease without esophagitis: Secondary | ICD-10-CM | POA: Diagnosis present

## 2024-04-04 DIAGNOSIS — J439 Emphysema, unspecified: Secondary | ICD-10-CM

## 2024-04-04 DIAGNOSIS — Z23 Encounter for immunization: Secondary | ICD-10-CM

## 2024-04-04 DIAGNOSIS — F200 Paranoid schizophrenia: Secondary | ICD-10-CM

## 2024-04-04 DIAGNOSIS — Z8679 Personal history of other diseases of the circulatory system: Secondary | ICD-10-CM | POA: Diagnosis not present

## 2024-04-04 MED ORDER — FAMOTIDINE 20 MG PO TABS: 20.0000 mg | ORAL_TABLET | Freq: Two times a day (BID) | ORAL | 2 refills | Status: DC

## 2024-04-04 NOTE — Patient Instructions (Addendum)
 Thank you for coming in today! Here is a summary of what we discussed:  - I'm glad to hear the inhaler is helping you! Please keep using it daily  -I sent in a prescription for Pepcid . Once we have the lab test from today back, I will let you know the next steps for your reflux symptoms.  -We will follow up about the referral once we get in touch with the specialists  -You can get the Shingrix  vaccine at the pharmacy. You will need a 2nd shot 2-6 months after the first. This vaccine is important to help prevent shingles.    We are checking some labs today. If they are abnormal, I will call you. If they are normal, I will send you a MyChart message (if it is active) or a letter in the mail. If you do not hear about your labs in the next 2 weeks, please call the office.   Please call the clinic at [phone] if your symptoms worsen or you have any concerns.  Best, Dr Adele

## 2024-04-04 NOTE — Progress Notes (Unsigned)
    SUBJECTIVE:   CHIEF COMPLAINT / HPI:   COPD Saw [facility] in early August Started Anoro ellipta  daily, states it is helping  GERD Stopped pepcid  Wants to restart   Guardianship Has referral for neuropsych that is pending  Going to [employer] for computer class Works at [employer]  PERTINENT  PMH / PSH: COPD, NAFLD, HLD, schizophrenia, MDD, tobacco use, breast cancer  OBJECTIVE:   BP 109/78   Pulse 100   Ht 5' 6 (1.676 m)   Wt 162 lb 9.6 oz (73.8 kg)   LMP 06/10/2011   SpO2 99%   BMI 26.24 kg/m   ***  ASSESSMENT/PLAN:   Assessment & Plan Chronic GERD  Paranoid schizophrenia (HCC)      Rea Raring, MD [facility]

## 2024-04-05 NOTE — Assessment & Plan Note (Signed)
 Pt with good adherence to anoro ellipta , will continue this. Will discuss tobacco cessation further at f/u appt.

## 2024-04-06 ENCOUNTER — Ambulatory Visit: Payer: Self-pay | Admitting: Family Medicine

## 2024-04-06 DIAGNOSIS — K219 Gastro-esophageal reflux disease without esophagitis: Secondary | ICD-10-CM

## 2024-04-06 LAB — H. PYLORI BREATH TEST: H pylori Breath Test: NEGATIVE

## 2024-04-06 MED ORDER — PANTOPRAZOLE SODIUM 40 MG PO TBEC: 40.0000 mg | DELAYED_RELEASE_TABLET | Freq: Every day | ORAL | 0 refills | Status: DC

## 2024-04-19 ENCOUNTER — Encounter: Payer: Self-pay | Admitting: Psychology

## 2024-06-17 ENCOUNTER — Other Ambulatory Visit: Payer: Self-pay | Admitting: Family Medicine

## 2024-06-17 DIAGNOSIS — K219 Gastro-esophageal reflux disease without esophagitis: Secondary | ICD-10-CM

## 2024-06-20 ENCOUNTER — Encounter

## 2024-07-12 ENCOUNTER — Other Ambulatory Visit: Payer: Self-pay

## 2024-07-13 MED ORDER — REFRESH OPTIVE 0.5-0.9 % OP SOLN: 1.0000 [drp] | Freq: Four times a day (QID) | OPHTHALMIC | 0 refills | Status: AC

## 2024-07-13 MED ORDER — ATORVASTATIN CALCIUM 40 MG PO TABS: 40.0000 mg | ORAL_TABLET | Freq: Every day | ORAL | 4 refills | Status: AC

## 2024-07-15 ENCOUNTER — Other Ambulatory Visit: Payer: Self-pay | Admitting: Family Medicine

## 2024-07-15 DIAGNOSIS — R7303 Prediabetes: Secondary | ICD-10-CM

## 2024-07-18 ENCOUNTER — Other Ambulatory Visit: Payer: Self-pay | Admitting: Family Medicine

## 2024-07-18 DIAGNOSIS — Z853 Personal history of malignant neoplasm of breast: Secondary | ICD-10-CM

## 2024-08-29 ENCOUNTER — Encounter: Admitting: Psychology

## 2024-09-02 ENCOUNTER — Encounter

## 2024-12-06 ENCOUNTER — Ambulatory Visit: Admitting: Nurse Practitioner

## 2024-12-06 ENCOUNTER — Other Ambulatory Visit
